# Patient Record
Sex: Male | Born: 1985 | Race: Black or African American | Hispanic: No | Marital: Single | Smoking: Never smoker
Health system: Southern US, Community
[De-identification: ages and names within clinical notes are randomized; demographics above are authoritative.]

## PROBLEM LIST (undated history)

## (undated) DIAGNOSIS — M2550 Pain in unspecified joint: Secondary | ICD-10-CM

## (undated) DIAGNOSIS — M25559 Pain in unspecified hip: Secondary | ICD-10-CM

## (undated) DIAGNOSIS — F988 Other specified behavioral and emotional disorders with onset usually occurring in childhood and adolescence: Secondary | ICD-10-CM

## (undated) DIAGNOSIS — E663 Overweight: Secondary | ICD-10-CM

## (undated) DIAGNOSIS — K635 Polyp of colon: Secondary | ICD-10-CM

## (undated) DIAGNOSIS — I10 Essential (primary) hypertension: Secondary | ICD-10-CM

## (undated) DIAGNOSIS — J45909 Unspecified asthma, uncomplicated: Secondary | ICD-10-CM

## (undated) HISTORY — DX: Other specified behavioral and emotional disorders with onset usually occurring in childhood and adolescence: F98.8

## (undated) HISTORY — PX: TONSILLECTOMY: SUR1361

## (undated) HISTORY — DX: Essential (primary) hypertension: I10

## (undated) HISTORY — DX: Pain in unspecified hip: M25.559

## (undated) HISTORY — DX: Polyp of colon: K63.5

## (undated) HISTORY — DX: Pain in unspecified joint: M25.50

## (undated) HISTORY — PX: WISDOM TOOTH EXTRACTION: SHX21

## (undated) HISTORY — DX: Overweight: E66.3

## (undated) HISTORY — PX: OTHER SURGICAL HISTORY: SHX169

## (undated) HISTORY — DX: Unspecified asthma, uncomplicated: J45.909

---

## 2006-07-18 ENCOUNTER — Emergency Department (HOSPITAL_COMMUNITY): Admission: EM | Admit: 2006-07-18 | Discharge: 2006-07-18 | Payer: Self-pay | Admitting: Family Medicine

## 2006-07-19 ENCOUNTER — Emergency Department (HOSPITAL_COMMUNITY): Admission: EM | Admit: 2006-07-19 | Discharge: 2006-07-19 | Payer: Self-pay | Admitting: Family Medicine

## 2006-07-20 ENCOUNTER — Emergency Department (HOSPITAL_COMMUNITY): Admission: EM | Admit: 2006-07-20 | Discharge: 2006-07-20 | Payer: Self-pay | Admitting: Family Medicine

## 2006-07-22 ENCOUNTER — Inpatient Hospital Stay (HOSPITAL_COMMUNITY): Admission: EM | Admit: 2006-07-22 | Discharge: 2006-07-25 | Payer: Self-pay | Admitting: Family Medicine

## 2007-01-11 ENCOUNTER — Emergency Department (HOSPITAL_COMMUNITY): Admission: EM | Admit: 2007-01-11 | Discharge: 2007-01-12 | Payer: Self-pay | Admitting: Emergency Medicine

## 2007-01-13 ENCOUNTER — Emergency Department (HOSPITAL_COMMUNITY): Admission: EM | Admit: 2007-01-13 | Discharge: 2007-01-13 | Payer: Self-pay | Admitting: Family Medicine

## 2013-07-20 ENCOUNTER — Other Ambulatory Visit: Payer: Self-pay | Admitting: Family Medicine

## 2013-07-20 ENCOUNTER — Ambulatory Visit
Admission: RE | Admit: 2013-07-20 | Discharge: 2013-07-20 | Disposition: A | Discharge status: Home / Self Care | Payer: No Typology Code available for payment source | Source: Ambulatory Visit | Attending: Family Medicine | Admitting: Family Medicine

## 2013-07-20 DIAGNOSIS — N5089 Other specified disorders of the male genital organs: Secondary | ICD-10-CM

## 2016-05-29 ENCOUNTER — Encounter (HOSPITAL_COMMUNITY): Payer: Self-pay | Admitting: *Deleted

## 2016-05-29 ENCOUNTER — Emergency Department (HOSPITAL_COMMUNITY)
Admission: EM | Admit: 2016-05-29 | Discharge: 2016-05-29 | Disposition: A | Discharge status: Home / Self Care | Payer: BLUE CROSS/BLUE SHIELD | Attending: Emergency Medicine | Admitting: Emergency Medicine

## 2016-05-29 DIAGNOSIS — R590 Localized enlarged lymph nodes: Secondary | ICD-10-CM | POA: Diagnosis not present

## 2016-05-29 DIAGNOSIS — Y929 Unspecified place or not applicable: Secondary | ICD-10-CM | POA: Diagnosis not present

## 2016-05-29 DIAGNOSIS — Y999 Unspecified external cause status: Secondary | ICD-10-CM | POA: Diagnosis not present

## 2016-05-29 DIAGNOSIS — W57XXXA Bitten or stung by nonvenomous insect and other nonvenomous arthropods, initial encounter: Secondary | ICD-10-CM | POA: Insufficient documentation

## 2016-05-29 DIAGNOSIS — Y939 Activity, unspecified: Secondary | ICD-10-CM | POA: Diagnosis not present

## 2016-05-29 DIAGNOSIS — S80862A Insect bite (nonvenomous), left lower leg, initial encounter: Secondary | ICD-10-CM | POA: Insufficient documentation

## 2016-05-29 MED ORDER — DOXYCYCLINE HYCLATE 100 MG PO CAPS: 100.0000 mg | ORAL_CAPSULE | Freq: Two times a day (BID) | ORAL | 0 refills | Status: DC

## 2016-05-29 NOTE — ED Provider Notes (Signed)
[place] DEPT Provider Note   CSN: [id number] Arrival date & time: 05/29/16  2050  By signing my name below, I, Jasmyn B. Alexander, attest that this documentation has been prepared under the direction and in the presence of Gloriann Loan, PA-C. Electronically Signed: Tedra Coupe. Sheppard Coil, ED Scribe. 05/29/16. 9:35 PM.  History   Chief Complaint Chief Complaint  Patient presents with  . Insect Bite    HPI HPI Comments: Travis [name] is a 30 y.o. male who presents to the Emergency Department complaining of gradual onset, right-sided lymphadenopathy and throat discomfort x 1 day. Pt reports being bitten by multiple ticks along his entire body after working in the woods on 05/28/16. He notes that ticks were small and were not on his body for more than 36 hours. Pt states that he went to Urgent Care later that day and received a tetanus shot. He has associated rash on bilateral lower extremities, voice change and left ankle pain. No alleviating factors noted. Denies any shortness of breath, trouble swallowing, fever, or chills.   The history is provided by the patient. No language interpreter was used.    History reviewed. No pertinent past medical history.  There are no active problems to display for this patient.   History reviewed. No pertinent surgical history.   Home Medications    Prior to Admission medications   Medication Sig Start Date End Date Taking? Authorizing Provider  doxycycline (VIBRAMYCIN) 100 MG capsule Take 1 capsule (100 mg total) by mouth 2 (two) times daily. 05/29/16   Gloriann Loan, PA-C    Family History No family history on file.  Social History Social History  Substance Use Topics  . Smoking status: Never Smoker  . Smokeless tobacco: Never Used  . Alcohol use Yes    Allergies   Review of patient's allergies indicates no known allergies.   Review of Systems Review of Systems  Constitutional: Negative for chills and fever.  HENT: Positive for  voice change. Negative for trouble swallowing.   Respiratory: Negative for shortness of breath.   Musculoskeletal: Positive for arthralgias.  Skin: Positive for rash.  All other systems reviewed and are negative.   Physical Exam Updated Vital Signs BP (!) 142/110 (BP Location: Left Arm)   Pulse 100   Temp 98.1 F (36.7 C) (Oral)   Resp 19   Ht 6\' 4"  (1.93 m)   Wt 131.5 kg   SpO2 100%   BMI 35.30 kg/m   Physical Exam  Constitutional: He is oriented to person, place, and time. He appears well-developed and well-nourished.  Non-toxic appearance. He does not have a sickly appearance. He does not appear ill.  HENT:  Head: Normocephalic and atraumatic.  Mouth/Throat: Uvula is midline, oropharynx is clear and moist and mucous membranes are normal. No oropharyngeal exudate, posterior oropharyngeal edema or posterior oropharyngeal erythema.  Eyes: Conjunctivae are normal.  Neck: Normal range of motion. Neck supple.  Cardiovascular: Normal rate and regular rhythm.   Pulmonary/Chest: Effort normal and breath sounds normal. No accessory muscle usage or stridor. No respiratory distress. He has no wheezes. He has no rhonchi. He has no rales.  Abdominal: Soft. Bowel sounds are normal. He exhibits no distension. There is no tenderness.  Musculoskeletal: Normal range of motion.  Lymphadenopathy:    He has cervical adenopathy (right sided).  Neurological: He is alert and oriented to person, place, and time.  Speech clear without dysarthria.  Skin: Skin is warm and dry.  Scattered maculopapular rash over  lower left extremity.  No ticks visualized.   Psychiatric: He has a normal mood and affect. His behavior is normal.    ED Treatments / Results  DIAGNOSTIC STUDIES: Oxygen Saturation is 100% on RA, normal by my interpretation.    COORDINATION OF CARE: 9:02 PM-Discussed treatment plan which includes order of Doxycycline with pt at bedside and pt agreed to plan.   Labs (all labs ordered are  listed, but only abnormal results are displayed) [place] MTN SPOTTED FVR ABS PNL(IGG+IGM)   Procedures Procedures (including critical care time)  Medications Ordered in ED Medications - No data to display   Initial Impression / Assessment and Plan / ED Course  I have reviewed the triage vital signs and the nursing notes.  Pertinent labs & imaging results that were available during my care of the patient were reviewed by me and considered in my medical decision making (see chart for details).  Clinical Course   Patient presents with multiple tick bites to LLE and arthralgias.  No ticks seen.  Right cervical lymphadenopathy.  VSS, NAD.  Titers obtained and pending.  Home with Doxycycline.  Follow up PCP.  Return precautions discussed.  Stable for discharge.    Final Clinical Impressions(s) / ED Diagnoses   Final diagnoses:  Tick bites    New Prescriptions New Prescriptions   DOXYCYCLINE (VIBRAMYCIN) 100 MG CAPSULE    Take 1 capsule (100 mg total) by mouth 2 (two) times daily.   I personally performed the services described in this documentation, which was scribed in my presence. The recorded information has been reviewed and is accurate.     Gloriann Loan, PA-C 05/29/16 2231    Drenda Freeze, MD 06/01/16 2136

## 2016-05-29 NOTE — ED Triage Notes (Addendum)
Pt c/o multiple tick bites this week. Reports pulling a tick off R side of neck. Mild swelling noted to area. Seen PCP yesterday who gave a tetanus shot. Denies difficulty breathing, trouble swallowing, fevers, or  Chills.

## 2016-05-31 LAB — B. BURGDORFI ANTIBODIES: B burgdorferi Ab IgG+IgM: <0.91 {ISR} (ref 0.00–0.90)

## 2016-06-03 LAB — ROCKY MTN SPOTTED FVR ABS PNL(IGG+IGM)
RMSF IgG: NEGATIVE
RMSF IgM: 0.38 index (ref 0.00–0.89)

## 2016-06-18 DIAGNOSIS — M79641 Pain in right hand: Secondary | ICD-10-CM | POA: Diagnosis not present

## 2016-07-18 DIAGNOSIS — W57XXXA Bitten or stung by nonvenomous insect and other nonvenomous arthropods, initial encounter: Secondary | ICD-10-CM | POA: Diagnosis not present

## 2016-07-18 DIAGNOSIS — M7989 Other specified soft tissue disorders: Secondary | ICD-10-CM | POA: Diagnosis not present

## 2016-07-18 DIAGNOSIS — M1990 Unspecified osteoarthritis, unspecified site: Secondary | ICD-10-CM | POA: Diagnosis not present

## 2016-08-28 DIAGNOSIS — H1131 Conjunctival hemorrhage, right eye: Secondary | ICD-10-CM | POA: Diagnosis not present

## 2016-11-22 ENCOUNTER — Encounter (HOSPITAL_COMMUNITY): Payer: Self-pay | Admitting: Emergency Medicine

## 2016-11-22 ENCOUNTER — Emergency Department (HOSPITAL_COMMUNITY): Payer: Self-pay

## 2016-11-22 ENCOUNTER — Emergency Department (HOSPITAL_COMMUNITY)
Admission: EM | Admit: 2016-11-22 | Discharge: 2016-11-22 | Disposition: A | Discharge status: Home / Self Care | Payer: Self-pay | Attending: Emergency Medicine | Admitting: Emergency Medicine

## 2016-11-22 DIAGNOSIS — R1033 Periumbilical pain: Secondary | ICD-10-CM | POA: Insufficient documentation

## 2016-11-22 LAB — URINALYSIS, ROUTINE W REFLEX MICROSCOPIC
Bilirubin Urine: NEGATIVE
Glucose, UA: NEGATIVE mg/dL
Hgb urine dipstick: NEGATIVE
Ketones, ur: NEGATIVE mg/dL
Leukocytes, UA: NEGATIVE
Nitrite: NEGATIVE
Protein, ur: 100 mg/dL — AB
Specific Gravity, Urine: 1.027 (ref 1.005–1.030)
Squamous Epithelial / LPF: NONE SEEN
pH: 8 (ref 5.0–8.0)

## 2016-11-22 LAB — CBC
HCT: 46.1 % (ref 39.0–52.0)
Hemoglobin: 16.2 g/dL (ref 13.0–17.0)
MCH: 28.8 pg (ref 26.0–34.0)
MCHC: 35.1 g/dL (ref 30.0–36.0)
MCV: 82 fL (ref 78.0–100.0)
Platelets: 178 10*3/uL (ref 150–400)
RBC: 5.62 MIL/uL (ref 4.22–5.81)
RDW: 12.3 % (ref 11.5–15.5)
WBC: 7.2 10*3/uL (ref 4.0–10.5)

## 2016-11-22 LAB — COMPREHENSIVE METABOLIC PANEL
ALT: 48 U/L (ref 17–63)
AST: 24 U/L (ref 15–41)
Albumin: 4.5 g/dL (ref 3.5–5.0)
Alkaline Phosphatase: 48 U/L (ref 38–126)
Anion gap: 7 (ref 5–15)
BUN: 13 mg/dL (ref 6–20)
CO2: 27 mmol/L (ref 22–32)
Calcium: 9.3 mg/dL (ref 8.9–10.3)
Chloride: 103 mmol/L (ref 101–111)
Creatinine, Ser: 0.73 mg/dL (ref 0.61–1.24)
GFR calc Af Amer: >60 mL/min (ref >60)
GFR calc non Af Amer: >60 mL/min (ref >60)
Glucose, Bld: 114 mg/dL — ABNORMAL HIGH (ref 65–99)
Potassium: 4.1 mmol/L (ref 3.5–5.1)
Sodium: 137 mmol/L (ref 135–145)
Total Bilirubin: 1.2 mg/dL (ref 0.3–1.2)
Total Protein: 7.6 g/dL (ref 6.5–8.1)

## 2016-11-22 LAB — LIPASE, BLOOD: Lipase: 20 U/L (ref 11–51)

## 2016-11-22 MED ORDER — HYDROMORPHONE HCL 1 MG/ML IJ SOLN
1.0000 mg | Freq: Once | INTRAMUSCULAR | Status: AC
  Administered 2016-11-22: 1 mg via INTRAVENOUS
  Filled 2016-11-22: qty 1

## 2016-11-22 MED ORDER — ONDANSETRON HCL 4 MG/2ML IJ SOLN
INTRAMUSCULAR | Status: AC
  Administered 2016-11-22: 4 mg via INTRAVENOUS
  Filled 2016-11-22: qty 2

## 2016-11-22 MED ORDER — IOPAMIDOL (ISOVUE-300) INJECTION 61%
INTRAVENOUS | Status: AC
  Filled 2016-11-22: qty 100

## 2016-11-22 MED ORDER — ONDANSETRON HCL 4 MG PO TABS: 4.0000 mg | ORAL_TABLET | Freq: Four times a day (QID) | ORAL | 0 refills | Status: DC

## 2016-11-22 MED ORDER — OMEPRAZOLE 20 MG PO CPDR: 20.0000 mg | DELAYED_RELEASE_CAPSULE | Freq: Every day | ORAL | 0 refills | Status: DC

## 2016-11-22 MED ORDER — IOPAMIDOL (ISOVUE-300) INJECTION 61%
100.0000 mL | Freq: Once | INTRAVENOUS | Status: AC | PRN
  Administered 2016-11-22: 100 mL via INTRAVENOUS

## 2016-11-22 MED ORDER — ONDANSETRON HCL 4 MG/2ML IJ SOLN
4.0000 mg | Freq: Once | INTRAMUSCULAR | Status: AC | PRN
  Administered 2016-11-22: 4 mg via INTRAVENOUS

## 2016-11-22 MED ORDER — SODIUM CHLORIDE 0.9 % IJ SOLN
INTRAMUSCULAR | Status: AC
  Filled 2016-11-22: qty 50

## 2016-11-22 NOTE — ED Provider Notes (Signed)
[facility] DEPT Provider Note   CSN: [id number] Arrival date & time: 11/22/16  0349     History   Chief Complaint Chief Complaint  Patient presents with  . Abdominal Pain    HPI Travis [name] is a 31 y.o. male.  Patient presents to the ED with a chief complaint of abdominal pain.  He states that the pain started last night and has steadily worsened until now.  He states that the pain is located mostly around his umbilicus.  He reports subjective fevers and chills with associated nausea, but denies any vomiting or diarrhea.  He states that he has tried taking pepto-bismol and imodium with no relief.  He rates the pain as a 7/10.  He denies any prior abdominal surgeries.   The history is provided by the patient. No language interpreter was used.    History reviewed. No pertinent past medical history.  There are no active problems to display for this patient.   Past Surgical History:  Procedure Laterality Date  . TONSILLECTOMY    . WISDOM TOOTH EXTRACTION         Home Medications    Prior to Admission medications   Medication Sig Start Date End Date Taking? Authorizing Provider  meloxicam (MOBIC) 15 MG tablet Take 15 mg by mouth daily as needed for pain.   Yes Historical Provider, MD  phentermine (ADIPEX-P) 37.5 MG tablet Take 37.5 mg by mouth daily before breakfast.   Yes Historical Provider, MD  doxycycline (VIBRAMYCIN) 100 MG capsule Take 1 capsule (100 mg total) by mouth 2 (two) times daily. Patient not taking: Reported on 11/22/2016 05/29/16   Gloriann Loan, PA-C    Family History No family history on file.  Social History Social History  Substance Use Topics  . Smoking status: Never Smoker  . Smokeless tobacco: Never Used  . Alcohol use Yes     Comment: on weekends     Allergies   Patient has no known allergies.   Review of Systems Review of Systems  Gastrointestinal: Positive for abdominal pain and nausea. Negative for diarrhea and vomiting.    All other systems reviewed and are negative.    Physical Exam Updated Vital Signs BP 155/82 (BP Location: Left Arm)   Pulse 96   Temp 98.2 F (36.8 C) (Oral)   Resp 18   SpO2 99%   Physical Exam  Constitutional: He is oriented to person, place, and time. He appears well-developed and well-nourished.  HENT:  Head: Normocephalic and atraumatic.  Eyes: Conjunctivae and EOM are normal. Pupils are equal, round, and reactive to light. Right eye exhibits no discharge. Left eye exhibits no discharge. No scleral icterus.  Neck: Normal range of motion. Neck supple. No JVD present.  Cardiovascular: Normal rate, regular rhythm and normal heart sounds.  Exam reveals no gallop and no friction rub.   No murmur heard. Pulmonary/Chest: Effort normal and breath sounds normal. No respiratory distress. He has no wheezes. He has no rales. He exhibits no tenderness.  Abdominal: Soft. Bowel sounds are normal. He exhibits no distension and no mass. There is tenderness. There is no rebound and no guarding.  Periumbilical tenderness, some right and left lower quadrant tenderness, no RUQ tenderness  Musculoskeletal: Normal range of motion. He exhibits no edema or tenderness.  Neurological: He is alert and oriented to person, place, and time.  Skin: Skin is warm and dry.  Psychiatric: He has a normal mood and affect. His behavior is normal. Judgment and thought  content normal.  Nursing note and vitals reviewed.    ED Treatments / Results  Labs (all labs ordered are listed, but only abnormal results are displayed) Labs Reviewed  COMPREHENSIVE METABOLIC PANEL - Abnormal; Notable for the following:       Result Value   Glucose, Bld 114 (*)    All other components within normal limits  URINALYSIS, ROUTINE W REFLEX MICROSCOPIC - Abnormal; Notable for the following:    APPearance HAZY (*)    Protein, ur 100 (*)    Bacteria, UA RARE (*)    All other components within normal limits  LIPASE, BLOOD  CBC     EKG  EKG Interpretation None       Radiology No results found.  Procedures Procedures (including critical care time)  Medications Ordered in ED Medications  HYDROmorphone (DILAUDID) injection 1 mg (not administered)  ondansetron (ZOFRAN) injection 4 mg (4 mg Intravenous Given 11/22/16 0415)     Initial Impression / Assessment and Plan / ED Course  I have reviewed the triage vital signs and the nursing notes.  Pertinent labs & imaging results that were available during my care of the patient were reviewed by me and considered in my medical decision making (see chart for details).     Patient with periumbilical pain.  Onset yesterday, worsening throughout the night.    Will check CT given location.  Labs are reassuring.  CT is negative.  Patient feeling better and wants to eat.  Patient instructed to return for:  New or worsening symptoms, including, increased abdominal pain, especially pain that localizes to one side, bloody vomit, bloody diarrhea, fever >101, and intractable vomiting.   Final Clinical Impressions(s) / ED Diagnoses   Final diagnoses:  Periumbilical abdominal pain    New Prescriptions New Prescriptions   OMEPRAZOLE (PRILOSEC) 20 MG CAPSULE    Take 1 capsule (20 mg total) by mouth daily.   ONDANSETRON (ZOFRAN) 4 MG TABLET    Take 1 tablet (4 mg total) by mouth every 6 (six) hours.     Montine Circle, PA-C 11/22/16 [id number]    April Palumbo, MD 11/23/16 [phone]

## 2016-11-22 NOTE — ED Triage Notes (Signed)
Pt c/o abdominal pain that began yesterday; pt took Imodium but pain did not subside as pt had hoped; pain is primarily located on left side; denies vomiting and diarrhea; denies urinary symptoms; last BM today

## 2016-11-22 NOTE — ED Notes (Signed)
Pt suddenly became diaphoretic with c/o extreme nausea and chills; pt in obvious distress; pt assisted to restroom with his wife

## 2016-11-22 NOTE — ED Notes (Signed)
Asked pt if he could give a urine sample, but said he is not able to.

## 2018-02-16 DIAGNOSIS — H5213 Myopia, bilateral: Secondary | ICD-10-CM | POA: Diagnosis not present

## 2018-04-23 DIAGNOSIS — L920 Granuloma annulare: Secondary | ICD-10-CM | POA: Diagnosis not present

## 2018-05-13 DIAGNOSIS — L531 Erythema annulare centrifugum: Secondary | ICD-10-CM | POA: Diagnosis not present

## 2018-05-13 DIAGNOSIS — B078 Other viral warts: Secondary | ICD-10-CM | POA: Diagnosis not present

## 2018-07-14 DIAGNOSIS — Z01818 Encounter for other preprocedural examination: Secondary | ICD-10-CM | POA: Diagnosis not present

## 2018-08-24 DIAGNOSIS — H40013 Open angle with borderline findings, low risk, bilateral: Secondary | ICD-10-CM | POA: Diagnosis not present

## 2018-08-28 DIAGNOSIS — Z86010 Personal history of colonic polyps: Secondary | ICD-10-CM | POA: Diagnosis not present

## 2019-03-30 DIAGNOSIS — Z20828 Contact with and (suspected) exposure to other viral communicable diseases: Secondary | ICD-10-CM | POA: Diagnosis not present

## 2019-04-05 DIAGNOSIS — Z03818 Encounter for observation for suspected exposure to other biological agents ruled out: Secondary | ICD-10-CM | POA: Diagnosis not present

## 2019-05-10 ENCOUNTER — Other Ambulatory Visit: Payer: Self-pay

## 2019-05-10 DIAGNOSIS — Z20822 Contact with and (suspected) exposure to covid-19: Secondary | ICD-10-CM

## 2019-05-12 LAB — NOVEL CORONAVIRUS, NAA: SARS-CoV-2, NAA: DETECTED — AB

## 2019-07-06 DIAGNOSIS — H40013 Open angle with borderline findings, low risk, bilateral: Secondary | ICD-10-CM | POA: Diagnosis not present

## 2019-07-21 ENCOUNTER — Other Ambulatory Visit: Payer: Self-pay

## 2019-07-23 ENCOUNTER — Ambulatory Visit: Payer: BLUE CROSS/BLUE SHIELD | Admitting: Family Medicine

## 2019-07-28 ENCOUNTER — Other Ambulatory Visit: Payer: Self-pay

## 2019-07-28 ENCOUNTER — Ambulatory Visit (INDEPENDENT_AMBULATORY_CARE_PROVIDER_SITE_OTHER): Payer: BC Managed Care – PPO | Admitting: Family Medicine

## 2019-07-28 ENCOUNTER — Encounter: Payer: Self-pay | Admitting: Family Medicine

## 2019-07-28 VITALS — BP 122/82 | HR 81 | Temp 97.8°F | Resp 12 | Ht 76.0 in | Wt 392.6 lb

## 2019-07-28 DIAGNOSIS — Z114 Encounter for screening for human immunodeficiency virus [HIV]: Secondary | ICD-10-CM | POA: Diagnosis not present

## 2019-07-28 DIAGNOSIS — Z6841 Body Mass Index (BMI) 40.0 and over, adult: Secondary | ICD-10-CM | POA: Insufficient documentation

## 2019-07-28 DIAGNOSIS — E66813 Obesity, class 3: Secondary | ICD-10-CM

## 2019-07-28 DIAGNOSIS — M25552 Pain in left hip: Secondary | ICD-10-CM

## 2019-07-28 DIAGNOSIS — Z0000 Encounter for general adult medical examination without abnormal findings: Secondary | ICD-10-CM | POA: Diagnosis not present

## 2019-07-28 HISTORY — DX: Body Mass Index (BMI) 40.0 and over, adult: Z684

## 2019-07-28 HISTORY — DX: Obesity, class 3: E66.813

## 2019-07-28 HISTORY — DX: Morbid (severe) obesity due to excess calories: E66.01

## 2019-07-28 HISTORY — DX: Pain in left hip: M25.552

## 2019-07-28 LAB — CBC
HCT: 44.6 % (ref 39.0–52.0)
Hemoglobin: 14.8 g/dL (ref 13.0–17.0)
MCHC: 33.1 g/dL (ref 30.0–36.0)
MCV: 85.2 fl (ref 78.0–100.0)
Platelets: 214 10*3/uL (ref 150.0–400.0)
RBC: 5.24 Mil/uL (ref 4.22–5.81)
RDW: 13.7 % (ref 11.5–15.5)
WBC: 5 10*3/uL (ref 4.0–10.5)

## 2019-07-28 LAB — COMPREHENSIVE METABOLIC PANEL
ALT: 41 U/L (ref 0–53)
AST: 20 U/L (ref 0–37)
Albumin: 4.8 g/dL (ref 3.5–5.2)
Alkaline Phosphatase: 47 U/L (ref 39–117)
BUN: 12 mg/dL (ref 6–23)
CO2: 27 mEq/L (ref 19–32)
Calcium: 9.7 mg/dL (ref 8.4–10.5)
Chloride: 104 mEq/L (ref 96–112)
Creatinine, Ser: 0.83 mg/dL (ref 0.40–1.50)
GFR: 128.89 mL/min (ref >60.00)
Glucose, Bld: 101 mg/dL — ABNORMAL HIGH (ref 70–99)
Potassium: 4.5 mEq/L (ref 3.5–5.1)
Sodium: 140 mEq/L (ref 135–145)
Total Bilirubin: 0.6 mg/dL (ref 0.2–1.2)
Total Protein: 7.4 g/dL (ref 6.0–8.3)

## 2019-07-28 LAB — LIPID PANEL
Cholesterol: 201 mg/dL — ABNORMAL HIGH (ref 0–200)
HDL: 48.4 mg/dL (ref >39.00)
LDL Cholesterol: 141 mg/dL — ABNORMAL HIGH (ref 0–99)
NonHDL: 152.55
Total CHOL/HDL Ratio: 4
Triglycerides: 59 mg/dL (ref 0.0–149.0)
VLDL: 11.8 mg/dL (ref 0.0–40.0)

## 2019-07-28 NOTE — Progress Notes (Signed)
Chief Complaint  Patient presents with  . Establish Care  . Hip Pain    Well Male Travis [name] is here for a complete physical.   His last physical was >1 year ago.  Current diet: in general, diet is fair.   Current exercise: HIIT Weight trend: has been losing Daytime fatigue? No. Seat belt? Yes.    Health maintenance Tetanus- Yes HIV- No   Patient has a several month history of left outer hip pain.  No injury or change in activity but when he does high intensity interval training, he notices it most after that.  He tries to stretch and stay active.  No bruising or swelling.  The pain/discomfort radiates down his leg to about the knee area.  No neurologic signs or symptoms.  Has been dealing with weight, his highest weight this yr was around 420 pounds.  He started to clean up his diet more so.  He is interested in ways he can lose weight safely.  Past Medical History:  Diagnosis Date  . Asthma    childhood  . Colon polyps   . HTN (hypertension)      Past Surgical History:  Procedure Laterality Date  . arm Right   . TONSILLECTOMY    . WISDOM TOOTH EXTRACTION      Medications  Takes no meds routinely   Allergies No Known Allergies  Family History Family History  Problem Relation Age of Onset  . Obesity Mother   . Mental illness Mother   . Irritable bowel syndrome Sister   . Hearing loss Maternal Grandmother     Review of Systems: Constitutional: no fevers or chills Eye:  no recent significant change in vision Ear/Nose/Mouth/Throat:  Ears:  no hearing loss Nose/Mouth/Throat:  no complaints of nasal congestion, no sore throat Cardiovascular:  no chest pain Respiratory:  no shortness of breath Gastrointestinal:  no abdominal pain, no change in bowel habits GU:  Male: negative for dysuria, frequency, and incontinence Musculoskeletal/Extremities: +hip pain; otherwise no pain of the joints Integumentary (Skin/Breast):  no abnormal skin lesions  reported Neurologic:  no headaches Endocrine: No unexpected weight changes Hematologic/Lymphatic:  no night sweats  Exam BP 122/82 (BP Location: Right Arm, Cuff Size: Large)   Pulse 81   Temp 97.8 F (36.6 C) (Temporal)   Resp 12   Ht 6\' 4"  (1.93 m)   Wt (!) 392 lb 9.6 oz (178.1 kg)   SpO2 98%   BMI 47.79 kg/m  General:  well developed, well nourished, in no apparent distress Skin:  no significant moles, warts, or growths Head:  no masses, lesions, or tenderness Eyes:  pupils equal and round, sclera anicteric without injection Ears:  canals without lesions, TMs shiny without retraction, no obvious effusion, no erythema Nose:  nares patent, septum midline, mucosa normal Throat/Pharynx:  lips and gingiva without lesion; tongue and uvula midline; non-inflamed pharynx; no exudates or postnasal drainage Neck: neck supple without adenopathy, thyromegaly, or masses Lungs:  clear to auscultation, breath sounds equal bilaterally, no respiratory distress Cardio:  regular rate and rhythm, no bruits, no LE edema Abdomen:  abdomen soft, nontender; bowel sounds normal; no masses or organomegaly Genital (male): nml penis, no lesions or discharge; testes present bilaterally without masses or tenderness Rectal: Deferred Musculoskeletal: Tight glue muscles; there is no current tenderness to palpation over the greater trochanteric bursa, no deformity; symmetrical muscle groups noted without atrophy or deformity Extremities:  no clubbing, cyanosis, or edema, no deformities, no skin discoloration Neuro:  gait normal; deep tendon reflexes normal and symmetric Psych: well oriented with normal range of affect and appropriate judgment/insight  Assessment and Plan  Well adult exam - Plan: CBC, Comprehensive metabolic panel, Lipid panel  Screening for HIV (human immunodeficiency virus) - Plan: HIV Antibody (routine testing w rflx)  Morbid obesity ([name]) - Plan: Amb Ref to Medical Weight  Management  Left hip pain   Well 33 y.o. male. Counseled on diet and exercise. Self testicular exams recommended at least monthly.  Contact insurance to see if they cover a dietitian/nutritionist.  If so, I can put the referral in.  Healthy diet handout given. Stretches and exercises for the gluteus group and IT band provided.  If no improvement, consider injection versus physical therapy. Other orders as above.  He is to find out when his last tetanus shot was. Follow up in 1 year pending the above workup. The patient voiced understanding and agreement to the plan.  [place], DO 07/28/19 10:57 AM

## 2019-07-28 NOTE — Patient Instructions (Addendum)
Give Travis [name] 2-3 business days to get the results of your labs back.   Let me know when your last tetanus shot was.  Keep the diet clean and stay active.  Do monthly self testicular checks in the shower. You are feeling for lumps/bumps that don't belong. If you feel anything like this, let me know!  If you do not hear anything about your referral in the next 1-2 weeks, call our office and ask for an update.  Let Travis [name] know if you need anything.  Healthy Eating Plan Many factors influence your heart health, including eating and exercise habits. Heart (coronary) risk increases with abnormal blood fat (lipid) levels. Heart-healthy meal planning includes limiting unhealthy fats, increasing healthy fats, and making other small dietary changes. This includes maintaining a healthy body weight to help keep lipid levels within a normal range.  WHAT IS MY PLAN?  Your health care provider recommends that you:  Drink a glass of water before meals to help with satiety.  Eat slowly.  An alternative to the water is to add Metamucil. This will help with satiety as well. It does contain calories, unlike water.  WHAT TYPES OF FAT SHOULD I CHOOSE?  Choose healthy fats more often. Choose monounsaturated and polyunsaturated fats, such as olive oil and canola oil, flaxseeds, walnuts, almonds, and seeds.  Eat more omega-3 fats. Good choices include salmon, mackerel, sardines, tuna, flaxseed oil, and ground flaxseeds. Aim to eat fish at least two times each week.  Avoid foods with partially hydrogenated oils in them. These contain trans fats. Examples of foods that contain trans fats are stick margarine, some tub margarines, cookies, crackers, and other baked goods. If you are going to avoid a fat, this is the one to avoid!  WHAT GENERAL GUIDELINES DO I NEED TO FOLLOW?  Check food labels carefully to identify foods with trans fats. Avoid these types of options when possible.  Fill one half of your plate with  vegetables and green salads. Eat 4-5 servings of vegetables per day. A serving of vegetables equals 1 cup of raw leafy vegetables,  cup of raw or cooked cut-up vegetables, or  cup of vegetable juice.  Fill one fourth of your plate with whole grains. Look for the word "whole" as the first word in the ingredient list.  Fill one fourth of your plate with lean protein foods.  Eat 4-5 servings of fruit per day. A serving of fruit equals one medium whole fruit,  cup of dried fruit,  cup of fresh, frozen, or canned fruit. Try to avoid fruits in cups/syrups as the sugar content can be high.  Eat more foods that contain soluble fiber. Examples of foods that contain this type of fiber are apples, broccoli, carrots, beans, peas, and barley. Aim to get 20-30 g of fiber per day.  Eat more home-cooked food and less restaurant, buffet, and fast food.  Limit or avoid alcohol.  Limit foods that are high in starch and sugar.  Avoid fried foods when able.  Cook foods by using methods other than frying. Baking, boiling, grilling, and broiling are all great options. Other fat-reducing suggestions include: ? Removing the skin from poultry. ? Removing all visible fats from meats. ? Skimming the fat off of stews, soups, and gravies before serving them. ? Steaming vegetables in water or broth.  Lose weight if you are overweight. Losing just 5-10% of your initial body weight can help your overall health and prevent diseases such as diabetes and heart  disease.  Increase your consumption of nuts, legumes, and seeds to 4-5 servings per week. One serving of dried beans or legumes equals  cup after being cooked, one serving of nuts equals 1 ounces, and one serving of seeds equals  ounce or 1 tablespoon.  WHAT ARE GOOD FOODS CAN I EAT? Grains Grainy breads (try to find bread that is 3 g of fiber per slice or greater), oatmeal, light popcorn. Whole-grain cereals. Rice and pasta, including brown rice and those  that are made with whole wheat. Edamame pasta is a great alternative to grain pasta. It has a higher protein content. Try to avoid significant consumption of white bread, sugary cereals, or pastries/baked goods.  Vegetables All vegetables. Cooked white potatoes do not count as vegetables.  Fruits All fruits, but limit pineapple and bananas as these fruits have a higher sugar content.  Meats and Other Protein Sources Lean, well-trimmed beef, veal, pork, and lamb. Chicken and [place] without skin. All fish and shellfish. Wild duck, rabbit, pheasant, and venison. Egg whites or low-cholesterol egg substitutes. Dried beans, peas, lentils, and tofu.Seeds and most nuts.  Dairy Low-fat or nonfat cheeses, including ricotta, string, and mozzarella. Skim or 1% milk that is liquid, powdered, or evaporated. Buttermilk that is made with low-fat milk. Nonfat or low-fat yogurt. Soy/Almond milk are good alternatives if you cannot handle dairy.  Beverages Water is the best for you. Sports drinks with less sugar are more desirable unless you are a highly active athlete.  Sweets and Desserts Sherbets and fruit ices. Honey, jam, marmalade, jelly, and syrups. Dark chocolate.  Eat all sweets and desserts in moderation.  Fats and Oils Nonhydrogenated (trans-free) margarines. Vegetable oils, including soybean, sesame, sunflower, olive, peanut, safflower, corn, canola, and cottonseed. Salad dressings or mayonnaise that are made with a vegetable oil. Limit added fats and oils that you use for cooking, baking, salads, and as spreads.  Other Cocoa powder. Coffee and tea. Most condiments.  The items listed above may not be a complete list of recommended foods or beverages. Contact your dietitian for more options.  Iliotibial Band Syndrome Rehab It is normal to feel mild stretching, pulling, tightness, or discomfort as you do these exercises, but you should stop right away if you feel sudden pain or your pain gets  worse.  Stretching and range of motion exercises These exercises warm up your muscles and joints and improve the movement and flexibility of your hip and pelvis. Exercise A: Quadriceps, prone    1. Lie on your abdomen on a firm surface, such as a bed or padded floor. 2. Bend your left / right knee and hold your ankle. If you cannot reach your ankle or pant leg, loop a belt around your foot and grab the belt instead. 3. Gently pull your heel toward your buttocks. Your knee should not slide out to the side. You should feel a stretch in the front of your thigh and knee. 4. Hold this position for 30 seconds. Repeat 2 times. Complete this stretch 3 times per week. Exercise B: Iliotibial band    1. Lie on your side with your left / right leg in the top position. 2. Bend both of your knees and grab your left / right ankle. Stretch out your bottom arm to help you balance. 3. Slowly bring your top knee back so your thigh goes behind your trunk. 4. Slowly lower your top leg toward the floor until you feel a gentle stretch on the outside of your  left / right hip and thigh. If you do not feel a stretch and your knee will not fall farther, place the heel of your other foot on top of your knee and pull your knee down toward the floor with your foot. 5. Hold this position for 30 seconds. Repeat 2 times. Complete this stretch 3 times per week. Strengthening exercises These exercises build strength and endurance in your hip and pelvis. Endurance is the ability to use your muscles for a long time, even after they get tired. Exercise C: Straight leg raises (hip abductors)     1. Lie on your side with your left / right leg in the top position. Lie so your head, shoulder, knee, and hip line up. You may bend your bottom knee to help you balance. 2. Roll your hips slightly forward so your hips are stacked directly over each other and your left / right knee is facing forward. 3. Tense the muscles in your outer  thigh and lift your top leg 4-6 inches (10-15 cm). 4. Hold this position for 3 seconds. Repeat for a total of 10 reps. 5. Slowly return to the starting position. Let your muscles relax completely before doing another repetition. Repeat 2 times. Complete this exercise 3 times per week. Exercise D: Straight leg raises (hip extensors) 1. Lie on your abdomen on your bed or a firm surface. You can put a pillow under your hips if that is more comfortable. 2. Bend your left / right knee so your foot is straight up in the air. 3. Squeeze your buttock muscles and lift your left / right thigh off the bed. Do not let your back arch. 4. Tense this muscle as hard as you can without increasing any knee pain. 5. Hold this position for 2 seconds. Repeat for a total of 10 reps 6. Slowly lower your leg to the starting position and allow it to relax completely. Repeat 2 times. Complete this exercise 3 times per week. Exercise E: Hip hike 1. Stand sideways on a bottom step. Stand on your left / right leg with your other foot unsupported next to the step. You can hold onto the railing or wall if needed for balance. 2. Keep your knees straight and your torso square. Then, lift your left / right hip up toward the ceiling. 3. Slowly let your left / right hip lower toward the floor, past the starting position. Your foot should get closer to the floor. Do not lean or bend your knees. Repeat 2 times. Complete this exercise 3 times per week.  Document Released: 09/23/2005 Document Revised: 05/28/2016 Document Reviewed: 08/25/2015 Elsevier Interactive Patient Education  2018 [place] Band Syndrome Rehab It is normal to feel mild stretching, pulling, tightness, or discomfort as you do these exercises, but you should stop right away if you feel sudden pain or your pain gets worse.  Stretching and range of motion exercises These exercises warm up your muscles and joints and improve the movement and  flexibility of your hip and pelvis. Exercise A: Quadriceps, prone    5. Lie on your abdomen on a firm surface, such as a bed or padded floor. 6. Bend your left / right knee and hold your ankle. If you cannot reach your ankle or pant leg, loop a belt around your foot and grab the belt instead. 7. Gently pull your heel toward your buttocks. Your knee should not slide out to the side. You should feel a stretch in  the front of your thigh and knee. 8. Hold this position for 30 seconds. Repeat 2 times. Complete this stretch 3 times per week. Exercise B: Iliotibial band    6. Lie on your side with your left / right leg in the top position. 7. Bend both of your knees and grab your left / right ankle. Stretch out your bottom arm to help you balance. 8. Slowly bring your top knee back so your thigh goes behind your trunk. 9. Slowly lower your top leg toward the floor until you feel a gentle stretch on the outside of your left / right hip and thigh. If you do not feel a stretch and your knee will not fall farther, place the heel of your other foot on top of your knee and pull your knee down toward the floor with your foot. 10. Hold this position for 30 seconds. Repeat 2 times. Complete this stretch 3 times per week. Strengthening exercises These exercises build strength and endurance in your hip and pelvis. Endurance is the ability to use your muscles for a long time, even after they get tired. Exercise C: Straight leg raises (hip abductors)     6. Lie on your side with your left / right leg in the top position. Lie so your head, shoulder, knee, and hip line up. You may bend your bottom knee to help you balance. 7. Roll your hips slightly forward so your hips are stacked directly over each other and your left / right knee is facing forward. 8. Tense the muscles in your outer thigh and lift your top leg 4-6 inches (10-15 cm). 9. Hold this position for 3 seconds. Repeat for a total of 10  reps. 10. Slowly return to the starting position. Let your muscles relax completely before doing another repetition. Repeat 2 times. Complete this exercise 3 times per week. Exercise D: Straight leg raises (hip extensors) 7. Lie on your abdomen on your bed or a firm surface. You can put a pillow under your hips if that is more comfortable. 8. Bend your left / right knee so your foot is straight up in the air. 9. Squeeze your buttock muscles and lift your left / right thigh off the bed. Do not let your back arch. 10. Tense this muscle as hard as you can without increasing any knee pain. 11. Hold this position for 2 seconds. Repeat for a total of 10 reps 12. Slowly lower your leg to the starting position and allow it to relax completely. Repeat 2 times. Complete this exercise 3 times per week. Exercise E: Hip hike 4. Stand sideways on a bottom step. Stand on your left / right leg with your other foot unsupported next to the step. You can hold onto the railing or wall if needed for balance. 5. Keep your knees straight and your torso square. Then, lift your left / right hip up toward the ceiling. 6. Slowly let your left / right hip lower toward the floor, past the starting position. Your foot should get closer to the floor. Do not lean or bend your knees. Repeat 2 times. Complete this exercise 3 times per week.  Document Released: 09/23/2005 Document Revised: 05/28/2016 Document Reviewed: 08/25/2015 Elsevier Interactive Patient Education  2018 [employer].    Piriformis Syndrome Rehab It is normal to feel mild stretching, pulling, tightness, or discomfort as you do these exercises, but you should stop right away if you feel sudden pain or your pain gets worse.  Stretching  and range of motion exercises These exercises warm up your muscles and joints and improve the movement and flexibility of your hip and pelvis. These exercises also help to relieve pain, numbness, and tingling. Exercise A:  Hip rotators   1. Lie on your back on a firm surface. 2. Pull your left / right knee toward your same shoulder with your left / right hand until your knee is pointing toward the ceiling. Hold your left / right ankle with your other hand. 3. Keeping your knee steady, gently pull your left / right ankle toward your other shoulder until you feel a stretch in your buttocks. 4. Hold this position for 30 seconds. Repeat 2 times. Complete this stretch 3 times per week. Exercise B: Hip extensors 1. Lie on your back on a firm surface. Both of your legs should be straight. 2. Pull your left / right knee to your chest. Hold your leg in this position by holding onto the back of your thigh or the front of your knee. 3. Hold this position for 30 seconds. 4. Slowly return to the starting position. Repeat 2 times. Complete this stretch 3 times per week.  Strengthening exercises These exercises build strength and endurance in your hip and thigh muscles. Endurance is the ability to use your muscles for a long time, even after they get tired. Exercise C: Straight leg raises (hip abductors)    1. Lie on your side with your left / right leg in the top position. Lie so your head, shoulder, knee, and hip line up. Bend your bottom knee to help you balance. 2. Lift your top leg up 4-6 inches (10-15 cm), keeping your toes pointed straight ahead. 3. Hold this position for 1 second. 4. Slowly lower your leg to the starting position. Let your muscles relax completely. Repeat for a total of 10 repetitions. Repeat 2 times. Complete this exercise 3 times per week. Exercise D: Hip abductors and rotators, quadruped   1. Get on your hands and knees on a firm, lightly padded surface. Your hands should be directly below your shoulders, and your knees should be directly below your hips. 2. Lift your left / right knee out to the side. Keep your knee bent. Do not twist your body. 3. Hold this position for 1  seconds. 4. Slowly lower your leg. Repeat for a total of 10 repetitions.  Repeat 1 times. Complete this exercise 3 times per week. Exercise E: Straight leg raises (hip extensors) 1. Lie on your abdomen on a bed or a firm surface with a pillow under your hips. 2. Squeeze your buttock muscles and lift your left / right thigh off the bed. Do not let your back arch. 3. Hold this position for 3 seconds. 4. Slowly return to the starting position. Let your muscles relax completely before doing another repetition. Repeat 2 times. Complete this exercise 3 times per week.  This information is not intended to replace advice given to you by your health care provider. Make sure you discuss any questions you have with your health care provider. Document Released: 09/23/2005 Document Revised: 05/28/2016 Document Reviewed: 09/05/2015 Elsevier Interactive Patient Education  [employer].

## 2019-07-29 ENCOUNTER — Other Ambulatory Visit (INDEPENDENT_AMBULATORY_CARE_PROVIDER_SITE_OTHER): Payer: BC Managed Care – PPO

## 2019-07-29 DIAGNOSIS — R739 Hyperglycemia, unspecified: Secondary | ICD-10-CM

## 2019-07-29 LAB — HEMOGLOBIN A1C: Hgb A1c MFr Bld: 5.6 % (ref 4.6–6.5)

## 2019-07-29 LAB — HIV ANTIBODY (ROUTINE TESTING W REFLEX): HIV 1&2 Ab, 4th Generation: NONREACTIVE

## 2019-08-10 ENCOUNTER — Ambulatory Visit: Payer: BLUE CROSS/BLUE SHIELD | Admitting: Family Medicine

## 2019-08-23 ENCOUNTER — Other Ambulatory Visit: Payer: Self-pay | Admitting: Family Medicine

## 2019-08-23 MED ORDER — ONDANSETRON 4 MG PO TBDP: 4.0000 mg | ORAL_TABLET | Freq: Three times a day (TID) | ORAL | 0 refills | Status: DC | PRN

## 2019-09-29 DIAGNOSIS — M4722 Other spondylosis with radiculopathy, cervical region: Secondary | ICD-10-CM | POA: Diagnosis not present

## 2019-09-29 DIAGNOSIS — M9901 Segmental and somatic dysfunction of cervical region: Secondary | ICD-10-CM | POA: Diagnosis not present

## 2019-09-29 DIAGNOSIS — M9902 Segmental and somatic dysfunction of thoracic region: Secondary | ICD-10-CM | POA: Diagnosis not present

## 2019-09-29 DIAGNOSIS — M9903 Segmental and somatic dysfunction of lumbar region: Secondary | ICD-10-CM | POA: Diagnosis not present

## 2019-10-09 ENCOUNTER — Encounter (HOSPITAL_COMMUNITY): Payer: Self-pay

## 2019-10-09 ENCOUNTER — Emergency Department (HOSPITAL_COMMUNITY): Payer: BC Managed Care – PPO

## 2019-10-09 ENCOUNTER — Emergency Department (HOSPITAL_COMMUNITY)
Admission: EM | Admit: 2019-10-09 | Discharge: 2019-10-09 | Disposition: A | Discharge status: Home / Self Care | Payer: BC Managed Care – PPO | Attending: Emergency Medicine | Admitting: Emergency Medicine

## 2019-10-09 ENCOUNTER — Other Ambulatory Visit: Payer: Self-pay

## 2019-10-09 DIAGNOSIS — J45909 Unspecified asthma, uncomplicated: Secondary | ICD-10-CM | POA: Diagnosis not present

## 2019-10-09 DIAGNOSIS — R202 Paresthesia of skin: Secondary | ICD-10-CM | POA: Insufficient documentation

## 2019-10-09 DIAGNOSIS — R071 Chest pain on breathing: Secondary | ICD-10-CM | POA: Diagnosis not present

## 2019-10-09 DIAGNOSIS — I10 Essential (primary) hypertension: Secondary | ICD-10-CM | POA: Insufficient documentation

## 2019-10-09 DIAGNOSIS — R079 Chest pain, unspecified: Secondary | ICD-10-CM | POA: Diagnosis not present

## 2019-10-09 DIAGNOSIS — R0789 Other chest pain: Secondary | ICD-10-CM | POA: Insufficient documentation

## 2019-10-09 DIAGNOSIS — R0602 Shortness of breath: Secondary | ICD-10-CM | POA: Diagnosis not present

## 2019-10-09 LAB — CBC WITH DIFFERENTIAL/PLATELET
Abs Immature Granulocytes: 0.02 10*3/uL (ref 0.00–0.07)
Basophils Absolute: 0 10*3/uL (ref 0.0–0.1)
Basophils Relative: 0 %
Eosinophils Absolute: 0.2 10*3/uL (ref 0.0–0.5)
Eosinophils Relative: 3 %
HCT: 48 % (ref 39.0–52.0)
Hemoglobin: 15.5 g/dL (ref 13.0–17.0)
Immature Granulocytes: 0 %
Lymphocytes Relative: 18 %
Lymphs Abs: 1 10*3/uL (ref 0.7–4.0)
MCH: 27.5 pg (ref 26.0–34.0)
MCHC: 32.3 g/dL (ref 30.0–36.0)
MCV: 85.3 fL (ref 80.0–100.0)
Monocytes Absolute: 0.4 10*3/uL (ref 0.1–1.0)
Monocytes Relative: 7 %
Neutro Abs: 4 10*3/uL (ref 1.7–7.7)
Neutrophils Relative %: 72 %
Platelets: 218 10*3/uL (ref 150–400)
RBC: 5.63 MIL/uL (ref 4.22–5.81)
RDW: 12.5 % (ref 11.5–15.5)
WBC: 5.6 10*3/uL (ref 4.0–10.5)
nRBC: 0 % (ref 0.0–0.2)

## 2019-10-09 LAB — COMPREHENSIVE METABOLIC PANEL
ALT: 39 U/L (ref 0–44)
AST: 21 U/L (ref 15–41)
Albumin: 4.3 g/dL (ref 3.5–5.0)
Alkaline Phosphatase: 43 U/L (ref 38–126)
Anion gap: 10 (ref 5–15)
BUN: 8 mg/dL (ref 6–20)
CO2: 27 mmol/L (ref 22–32)
Calcium: 9.3 mg/dL (ref 8.9–10.3)
Chloride: 103 mmol/L (ref 98–111)
Creatinine, Ser: 0.76 mg/dL (ref 0.61–1.24)
GFR calc Af Amer: >60 mL/min (ref >60)
GFR calc non Af Amer: >60 mL/min (ref >60)
Glucose, Bld: 101 mg/dL — ABNORMAL HIGH (ref 70–99)
Potassium: 4.3 mmol/L (ref 3.5–5.1)
Sodium: 140 mmol/L (ref 135–145)
Total Bilirubin: 0.6 mg/dL (ref 0.3–1.2)
Total Protein: 7.5 g/dL (ref 6.5–8.1)

## 2019-10-09 LAB — TROPONIN I (HIGH SENSITIVITY)
Troponin I (High Sensitivity): 5 ng/L (ref <18)
Troponin I (High Sensitivity): 5 ng/L (ref <18)

## 2019-10-09 MED ORDER — SODIUM CHLORIDE 0.9% FLUSH
3.0000 mL | Freq: Once | INTRAVENOUS | Status: AC
  Administered 2019-10-09: 3 mL via INTRAVENOUS

## 2019-10-09 MED ORDER — ACETAMINOPHEN 500 MG PO TABS
1000.0000 mg | ORAL_TABLET | Freq: Once | ORAL | Status: AC
  Administered 2019-10-09: 1000 mg via ORAL
  Filled 2019-10-09: qty 2

## 2019-10-09 NOTE — ED Notes (Signed)
Pt is alert and oriented x 4 and is verbally responsive. Pt report mid-sternal chest pain 5/10.

## 2019-10-09 NOTE — ED Provider Notes (Signed)
[facility] DEPT Provider Note   CSN: [id number] Arrival date & time: 10/09/19  1132     History Chief Complaint  Patient presents with  . Chest Pain    Travis [name] is a 34 y.o. male.  HPI He presents for evaluation of anterior chest pain in the area of the sternum which started last night around 9 PM, without provocation.  The pain persisted, but did not bother him while he was sleeping.  At this time he complains of pain 5/10.  He states the pain was worse last night.  He reports that the pain is worse with palpation and deep breathing.  No known trauma.  He has noticed a sensation of shortness of breath and tingling in his left arm and left hand, today.  He denies fever, chills, cough, known Covid exposure, nausea, vomiting, weakness or dizziness.  No prior similar problems.  There are no other known modifying factors.    Past Medical History:  Diagnosis Date  . Asthma    childhood  . Colon polyps   . HTN (hypertension)     Patient Active Problem List   Diagnosis Date Noted  . Morbid obesity ([place]) 07/28/2019  . Left hip pain 07/28/2019    Past Surgical History:  Procedure Laterality Date  . arm Right   . TONSILLECTOMY    . WISDOM TOOTH EXTRACTION         Family History  Problem Relation Age of Onset  . Obesity Mother   . Mental illness Mother   . Irritable bowel syndrome Sister   . Hearing loss Maternal Grandmother     Social History   Tobacco Use  . Smoking status: Never Smoker  . Smokeless tobacco: Never Used  Substance Use Topics  . Alcohol use: Yes    Comment: on weekends  . Drug use: No    Home Medications Prior to Admission medications   Medication Sig Start Date End Date Taking? Authorizing Provider  ondansetron (ZOFRAN-ODT) 4 MG disintegrating tablet Take 1 tablet (4 mg total) by mouth every 8 (eight) hours as needed for nausea or vomiting. 08/23/19  Yes Shelda Pal, DO    Allergies    Patient  has no known allergies.  Review of Systems   Review of Systems  Unable to perform ROS: Mental status change  All other systems reviewed and are negative.   Physical Exam Updated Vital Signs BP (!) 155/96 (BP Location: Left Arm)   Pulse 78   Temp 98.7 F (37.1 C) (Oral)   Resp 18   SpO2 99%   Physical Exam Vitals and nursing note reviewed.  Constitutional:      Appearance: He is well-developed. He is obese. He is not ill-appearing, toxic-appearing or diaphoretic.  HENT:     Head: Normocephalic and atraumatic.     Right Ear: External ear normal.     Left Ear: External ear normal.  Eyes:     Conjunctiva/sclera: Conjunctivae normal.     Pupils: Pupils are equal, round, and reactive to light.  Neck:     Trachea: Phonation normal.  Cardiovascular:     Rate and Rhythm: Normal rate and regular rhythm.     Heart sounds: Normal heart sounds.     Comments: No tachycardia Pulmonary:     Effort: Pulmonary effort is normal.     Breath sounds: Normal breath sounds.     Comments: No tachypnea Abdominal:     General: There is no distension.  Palpations: Abdomen is soft.     Tenderness: There is no abdominal tenderness.  Musculoskeletal:        General: No swelling. Normal range of motion.     Cervical back: Normal range of motion and neck supple.  Skin:    General: Skin is warm and dry.     Coloration: Skin is not jaundiced.  Neurological:     Mental Status: He is alert and oriented to person, place, and time.     Cranial Nerves: No cranial nerve deficit.     Sensory: No sensory deficit.     Motor: No abnormal muscle tone.     Coordination: Coordination normal.  Psychiatric:        Mood and Affect: Mood normal.        Behavior: Behavior normal.        Thought Content: Thought content normal.        Judgment: Judgment normal.     ED Results / Procedures / Treatments   Labs (all labs ordered are listed, but only abnormal results are displayed) Labs Reviewed   COMPREHENSIVE METABOLIC PANEL - Abnormal; Notable for the following components:      Result Value   Glucose, Bld 101 (*)    All other components within normal limits  CBC WITH DIFFERENTIAL/PLATELET  TROPONIN I (HIGH SENSITIVITY)  TROPONIN I (HIGH SENSITIVITY)    EKG EKG Interpretation  Date/Time:  Saturday October 09 2019 11:44:54 EST Ventricular Rate:  82 PR Interval:    QRS Duration: 104 QT Interval:  359 QTC Calculation: 420 R Axis:   70 Text Interpretation: Sinus rhythm Borderline repolarization abnormality Baseline wander in lead(s) II III aVF V4 V5 No old tracing to compare Confirmed by Daleen Bo [phone]) on 10/09/2019 12:07:51 PM   Radiology DG Chest 2 View  Result Date: 10/09/2019 CLINICAL DATA:  Pt presents with c/o central chest pain that started last night. Pt reports some mild shortness of breath as well. H/o Asthma and HTN. Nonsmoker. EXAM: CHEST - 2 VIEW COMPARISON:  None FINDINGS: The heart size and mediastinal contours are within normal limits. The lungs are clear. No pneumothorax or pleural effusion. The visualized skeletal structures are unremarkable. IMPRESSION: No active cardiopulmonary disease. Electronically Signed   By: Audie Pinto M.D.   On: 10/09/2019 12:08    Procedures Procedures (including critical care time)  Medications Ordered in ED Medications  sodium chloride flush (NS) 0.9 % injection 3 mL (3 mLs Intravenous Given 10/09/19 1303)    ED Course  I have reviewed the triage vital signs and the nursing notes.  Pertinent labs & imaging results that were available during my care of the patient were reviewed by me and considered in my medical decision making (see chart for details).  Clinical Course as of Oct 08 1434  Sat Oct 09, 2019  1433 Normal  Troponin I (High Sensitivity) [EW]  1433 Normal  CBC with Differential [EW]  1433 Normal  Comprehensive metabolic panel(!) [EW]  99991111 No CHF or infiltrate, interpreted by me  DG Chest 2 View  [EW]    Clinical Course User Index [EW] Daleen Bo, MD   MDM Rules/Calculators/A&P                       Patient Vitals for the past 24 hrs:  BP Pulse Resp SpO2  10/09/19 1629 (!) 157/81 65 19 97 %  10/09/19 1530 (!) 143/75 77 (!) 24 98 %  10/09/19 1524 (!)  147/97 68 18 98 %    At D/C- Reevaluation with update and discussion. After initial assessment and treatment, an updated evaluation reveals no additional complaints, findings discussed and questions answered. Daleen Bo   Medical Decision Making: Nonspecific chest pain, most likely chest wall discomfort.  Doubt ACS, PE or pneumonia.  Reassuring evaluation in the emergency department.  Mild hypertension is present.   CRITICAL CARE-none Performed by: Daleen Bo   Nursing Notes Reviewed/ Care Coordinated Applicable Imaging Reviewed Interpretation of Laboratory Data incorporated into ED treatment  The patient appears reasonably screened and/or stabilized for discharge and I doubt any other medical condition or other [facility] requiring further screening, evaluation, or treatment in the ED at this time prior to discharge.  Plan: Home Medications-Tylenol for pain, OTC medicine as needed; Home Treatments-gradual advance diet and activity.  Use heat on sore area; return here if the recommended treatment, does not improve the symptoms; Recommended follow up-PCP follow-up 1 week and as needed.   Final Clinical Impression(s) / ED Diagnoses Final diagnoses:  Chest wall pain    Rx / DC Orders ED Discharge Orders    None       Daleen Bo, MD 10/10/19 1234

## 2019-10-09 NOTE — Discharge Instructions (Addendum)
The testing today did not show any serious problems with your heart or lungs.  For the discomfort try using Tylenol every 4 hours and heat on the sore area.  If your conditions worsen, return here.  If you are still uncomfortable next week, see your doctor for a checkup.

## 2019-10-09 NOTE — ED Triage Notes (Signed)
Pt presents with c/o chest pain that started last night, center of the chest. Pt reports some mild shortness of breath as well.

## 2019-10-11 ENCOUNTER — Encounter: Payer: Self-pay | Admitting: Family Medicine

## 2019-10-11 DIAGNOSIS — M9901 Segmental and somatic dysfunction of cervical region: Secondary | ICD-10-CM | POA: Diagnosis not present

## 2019-10-11 DIAGNOSIS — M9902 Segmental and somatic dysfunction of thoracic region: Secondary | ICD-10-CM | POA: Diagnosis not present

## 2019-10-11 DIAGNOSIS — M4722 Other spondylosis with radiculopathy, cervical region: Secondary | ICD-10-CM | POA: Diagnosis not present

## 2019-10-11 DIAGNOSIS — M9903 Segmental and somatic dysfunction of lumbar region: Secondary | ICD-10-CM | POA: Diagnosis not present

## 2019-10-12 DIAGNOSIS — M4722 Other spondylosis with radiculopathy, cervical region: Secondary | ICD-10-CM | POA: Diagnosis not present

## 2019-10-12 DIAGNOSIS — M9903 Segmental and somatic dysfunction of lumbar region: Secondary | ICD-10-CM | POA: Diagnosis not present

## 2019-10-12 DIAGNOSIS — M9902 Segmental and somatic dysfunction of thoracic region: Secondary | ICD-10-CM | POA: Diagnosis not present

## 2019-10-12 DIAGNOSIS — M9901 Segmental and somatic dysfunction of cervical region: Secondary | ICD-10-CM | POA: Diagnosis not present

## 2019-10-13 DIAGNOSIS — M9901 Segmental and somatic dysfunction of cervical region: Secondary | ICD-10-CM | POA: Diagnosis not present

## 2019-10-13 DIAGNOSIS — M9902 Segmental and somatic dysfunction of thoracic region: Secondary | ICD-10-CM | POA: Diagnosis not present

## 2019-10-13 DIAGNOSIS — M9903 Segmental and somatic dysfunction of lumbar region: Secondary | ICD-10-CM | POA: Diagnosis not present

## 2019-10-13 DIAGNOSIS — M4722 Other spondylosis with radiculopathy, cervical region: Secondary | ICD-10-CM | POA: Diagnosis not present

## 2019-10-18 DIAGNOSIS — M9902 Segmental and somatic dysfunction of thoracic region: Secondary | ICD-10-CM | POA: Diagnosis not present

## 2019-10-18 DIAGNOSIS — M9903 Segmental and somatic dysfunction of lumbar region: Secondary | ICD-10-CM | POA: Diagnosis not present

## 2019-10-18 DIAGNOSIS — M4722 Other spondylosis with radiculopathy, cervical region: Secondary | ICD-10-CM | POA: Diagnosis not present

## 2019-10-18 DIAGNOSIS — M9901 Segmental and somatic dysfunction of cervical region: Secondary | ICD-10-CM | POA: Diagnosis not present

## 2019-10-19 DIAGNOSIS — M9902 Segmental and somatic dysfunction of thoracic region: Secondary | ICD-10-CM | POA: Diagnosis not present

## 2019-10-19 DIAGNOSIS — M9903 Segmental and somatic dysfunction of lumbar region: Secondary | ICD-10-CM | POA: Diagnosis not present

## 2019-10-19 DIAGNOSIS — M9901 Segmental and somatic dysfunction of cervical region: Secondary | ICD-10-CM | POA: Diagnosis not present

## 2019-10-19 DIAGNOSIS — M4722 Other spondylosis with radiculopathy, cervical region: Secondary | ICD-10-CM | POA: Diagnosis not present

## 2019-10-20 DIAGNOSIS — M9903 Segmental and somatic dysfunction of lumbar region: Secondary | ICD-10-CM | POA: Diagnosis not present

## 2019-10-20 DIAGNOSIS — M9902 Segmental and somatic dysfunction of thoracic region: Secondary | ICD-10-CM | POA: Diagnosis not present

## 2019-10-20 DIAGNOSIS — M9901 Segmental and somatic dysfunction of cervical region: Secondary | ICD-10-CM | POA: Diagnosis not present

## 2019-10-20 DIAGNOSIS — M4722 Other spondylosis with radiculopathy, cervical region: Secondary | ICD-10-CM | POA: Diagnosis not present

## 2019-12-14 DIAGNOSIS — Z20828 Contact with and (suspected) exposure to other viral communicable diseases: Secondary | ICD-10-CM | POA: Diagnosis not present

## 2019-12-14 DIAGNOSIS — Z03818 Encounter for observation for suspected exposure to other biological agents ruled out: Secondary | ICD-10-CM | POA: Diagnosis not present

## 2019-12-22 ENCOUNTER — Other Ambulatory Visit: Payer: Self-pay | Admitting: Family Medicine

## 2019-12-22 MED ORDER — ONDANSETRON 4 MG PO TBDP: 4.0000 mg | ORAL_TABLET | Freq: Three times a day (TID) | ORAL | 0 refills | Status: DC | PRN

## 2020-01-05 ENCOUNTER — Encounter (INDEPENDENT_AMBULATORY_CARE_PROVIDER_SITE_OTHER): Payer: Self-pay | Admitting: Family Medicine

## 2020-01-05 ENCOUNTER — Ambulatory Visit (INDEPENDENT_AMBULATORY_CARE_PROVIDER_SITE_OTHER): Payer: BC Managed Care – PPO | Admitting: Family Medicine

## 2020-01-05 ENCOUNTER — Other Ambulatory Visit: Payer: Self-pay

## 2020-01-05 VITALS — BP 130/89 | HR 74 | Temp 98.1°F | Ht 75.0 in | Wt 399.0 lb

## 2020-01-05 DIAGNOSIS — Z6841 Body Mass Index (BMI) 40.0 and over, adult: Secondary | ICD-10-CM

## 2020-01-05 DIAGNOSIS — R0602 Shortness of breath: Secondary | ICD-10-CM

## 2020-01-05 DIAGNOSIS — R5383 Other fatigue: Secondary | ICD-10-CM

## 2020-01-05 DIAGNOSIS — Z1331 Encounter for screening for depression: Secondary | ICD-10-CM

## 2020-01-05 DIAGNOSIS — Z9189 Other specified personal risk factors, not elsewhere classified: Secondary | ICD-10-CM | POA: Diagnosis not present

## 2020-01-05 DIAGNOSIS — R030 Elevated blood-pressure reading, without diagnosis of hypertension: Secondary | ICD-10-CM

## 2020-01-05 DIAGNOSIS — E7849 Other hyperlipidemia: Secondary | ICD-10-CM | POA: Diagnosis not present

## 2020-01-05 DIAGNOSIS — H40013 Open angle with borderline findings, low risk, bilateral: Secondary | ICD-10-CM | POA: Diagnosis not present

## 2020-01-05 DIAGNOSIS — Z0289 Encounter for other administrative examinations: Secondary | ICD-10-CM

## 2020-01-05 NOTE — Progress Notes (Signed)
Dear Dr. Riki [name],   Thank you for referring Travis [name] to our clinic. The following note includes my evaluation and treatment recommendations.  Chief Complaint:   OBESITY Travis [name] (MR# [id number]) is a 34 y.o. male who presents for evaluation and treatment of obesity and related comorbidities. Current BMI is Body mass index is 49.87 kg/m.Marland Kitchen Travis [name] has been struggling with his weight for many years and has been unsuccessful in either losing weight, maintaining weight loss, or reaching his healthy weight goal.  Travis [name] is currently in the action stage of change and ready to dedicate time achieving and maintaining a healthier weight. Travis [name] is interested in becoming our patient and working on intensive lifestyle modifications including (but not limited to) diet and exercise for weight loss.  Travis [name] is often skipping breakfast and lunch. Breakfast may be a sweet 'n salty granola bar; for lunch between 1-2 p.m. he will have 2 Crafted Tacos or Kabuto hibachi shrimp or chicken and rice (eat all) or Mexican rice and chicken; for dinner between 6:30-7 p.m. he will have grilled meat (1/2 thigh and 1/2 breast) with 1 1/2-2 cups of rice and 2 cups of vegetables.  Travis [name]'s habits were reviewed today and are as follows: his desired weight loss is 139 lbs, he has been heavy most of his life, he started gaining weight during and after college, his heaviest weight ever was 413 pounds, he craves flavorful foods, he skips breakfast and/or lunch frequently, he frequently makes poor food choices and he frequently eats larger portions than normal.  Depression Screen Travis [name]'s Food and Mood (modified PHQ-9) score was 7.  Depression screen PHQ 2/9 01/05/2020  Decreased Interest 1  Down, Depressed, Hopeless 1  PHQ - 2 Score 2  Altered sleeping 1  Tired, decreased energy 1  Change in appetite 0  Feeling bad or failure about yourself  0  Trouble concentrating 1  Moving slowly or  fidgety/restless 2  Suicidal thoughts 0  PHQ-9 Score 7  Difficult doing work/chores Somewhat difficult   Subjective:   Other fatigue. Travis [name] admits to daytime somnolence and denies waking up still tired. Travis [name] has a history of symptoms of daytime fatigue and Epworth sleepiness scale. Travis [name] generally gets 5-7 hours of sleep per night, and states that he does not sleep well most nights. Snoring is present. Apneic episodes are not present. Epworth Sleepiness Score is 10.  SOB (shortness of breath) on exertion. Travis [name] notes increasing shortness of breath with exercising and seems to be worsening over time with weight gain. He notes getting out of breath sooner with activity than he used to. This has gotten worse recently. Travis [name] denies shortness of breath at rest or orthopnea.  Elevated blood pressure reading. Previously blood pressure was elevated - is controlled today. No chest pain, chest pressure, or headache.   Other hyperlipidemia. Travis [name] has hyperlipidemia and has been trying to improve his cholesterol levels with intensive lifestyle modification including a low saturated fat diet, exercise and weight loss. He denies any chest pain, claudication or myalgias. He reports LDL previously elevated >140. He is on no medications.  Lab Results  Component Value Date   ALT 39 10/09/2019   AST 21 10/09/2019   ALKPHOS 43 10/09/2019   BILITOT 0.6 10/09/2019   Lab Results  Component Value Date   CHOL 201 (H) 07/28/2019   HDL 48.40 07/28/2019   LDLCALC 141 (H) 07/28/2019   TRIG 59.0 07/28/2019   CHOLHDL 4 07/28/2019   Depression screening.  Travis [name] had a mildly positive depression screen with a PHQ-9 score of 7.  At risk for heart disease. Travis [name] is at a higher than average risk for cardiovascular disease due to obesity.   Assessment/Plan:   Other fatigue. Travis [name] does feel that his weight is causing his energy to be lower than it should be. Fatigue may be related to obesity,  depression or many other causes. Labs will be ordered, and in the meanwhile, [name] will focus on self care including making healthy food choices, increasing physical activity and focusing on stress reduction. EKG 12-Lead showed NSR at 68 BPM. Comprehensive metabolic panel, Hemoglobin A1c, Insulin, random, VITAMIN D 25 Hydroxy (Vit-D Deficiency, Fractures), Vitamin B12, Folate, T3, T4, free, TSH labs ordered today.  SOB (shortness of breath) on exertion. Travis [name] does feel that he gets out of breath more easily that he used to when he exercises. Travis [name]'s shortness of breath appears to be obesity related and exercise induced. He has agreed to work on weight loss and gradually increase exercise to treat his exercise induced shortness of breath. Will continue to monitor closely.  Elevated blood pressure reading. Travis [name] will have labs checked today.  Other hyperlipidemia. Cardiovascular risk and specific lipid/LDL goals reviewed.  We discussed several lifestyle modifications today and Travis [name] will continue to work on diet, exercise and weight loss efforts. Orders and follow up as documented in patient record. Lipid Panel With LDL/HDL Ratio labs ordered today.  Counseling Intensive lifestyle modifications are the first line treatment for this issue. . Dietary changes: Increase soluble fiber. Decrease simple carbohydrates. . Exercise changes: Moderate to vigorous-intensity aerobic activity 150 minutes per week if tolerated. . Lipid-lowering medications: see documented in medical record.       Depression screening. Travis [name] had a positive depression screening. Depression is commonly associated with obesity and often results in emotional eating behaviors. We will monitor this closely and work on CBT to help improve the non-hunger eating patterns. Referral to Psychology may be required if no improvement is seen as he continues in our clinic.  At risk for heart disease. Travis [name] was given approximately 15  minutes of coronary artery disease prevention counseling today. He is 34 y.o. male and has risk factors for heart disease including obesity. We discussed intensive lifestyle modifications today with an emphasis on specific weight loss instructions and strategies.   Repetitive spaced learning was employed today to elicit superior memory formation and behavioral change.  Class 3 severe obesity with serious comorbidity and body mass index (BMI) of 45.0 to 49.9 in adult, unspecified obesity type ([place]).  Travis [name] is currently in the action stage of change and his goal is to continue with weight loss efforts. I recommend Travis [name] begin the structured treatment plan as follows:  He has agreed to the Category 4 Plan.  Exercise goals: Travis [name] will continue his current exercise regimen.  Behavioral modification strategies: increasing lean protein intake, increasing vegetables, meal planning and cooking strategies, keeping healthy foods in the home and planning for success.  He was informed of the importance of frequent follow-up visits to maximize his success with intensive lifestyle modifications for his multiple health conditions. He was informed we would discuss his lab results at his next visit unless there is a critical issue that needs to be addressed sooner. Travis [name] agreed to keep his next visit at the agreed upon time to discuss these results.  Objective:   Blood pressure 130/89, pulse 74, temperature 98.1 F (36.7 C), temperature source Oral, height 6\' 3"  (1.905  m), weight (!) 399 lb (181 kg), SpO2 96 %. Body mass index is 49.87 kg/m.  EKG: Normal sinus rhythm, rate 68 BPM. Within normal limits.  Indirect Calorimeter completed today shows a VO2 of 350 and a REE of 2436.  His calculated basal metabolic rate is 99991111 thus his basal metabolic rate is worse than expected.  General: Cooperative, alert, well developed, in no acute distress. HEENT: Conjunctivae and lids unremarkable. Cardiovascular:  Regular rhythm.  Lungs: Normal work of breathing. Neurologic: No focal deficits.   Lab Results  Component Value Date   CREATININE 0.76 10/09/2019   BUN 8 10/09/2019   NA 140 10/09/2019   K 4.3 10/09/2019   CL 103 10/09/2019   CO2 27 10/09/2019   Lab Results  Component Value Date   ALT 39 10/09/2019   AST 21 10/09/2019   ALKPHOS 43 10/09/2019   BILITOT 0.6 10/09/2019   Lab Results  Component Value Date   HGBA1C 5.6 07/29/2019   No results found for: INSULIN No results found for: TSH Lab Results  Component Value Date   CHOL 201 (H) 07/28/2019   HDL 48.40 07/28/2019   LDLCALC 141 (H) 07/28/2019   TRIG 59.0 07/28/2019   CHOLHDL 4 07/28/2019   Lab Results  Component Value Date   WBC 5.6 10/09/2019   HGB 15.5 10/09/2019   HCT 48.0 10/09/2019   MCV 85.3 10/09/2019   PLT 218 10/09/2019   No results found for: IRON, TIBC, FERRITIN  Attestation Statements:   This is the patient's first visit at [facility]. The patient's NEW PATIENT PACKET was reviewed at length. Included in the packet: current and past health history, medications, allergies, ROS, gynecologic history (women only), surgical history, family history, social history, weight history, weight loss surgery history (for those that have had weight loss surgery), nutritional evaluation, mood and food questionnaire, PHQ9, Epworth questionnaire, sleep habits questionnaire, patient life and health improvement goals questionnaire. These will all be scanned into the patient's chart under media.   During the visit, I independently reviewed the patient's EKG, bioimpedance scale results, and indirect calorimeter results. I used this information to tailor a meal plan for the patient that will help him to lose weight and will improve his obesity-related conditions going forward. I performed a medically necessary appropriate examination and/or evaluation. I discussed the assessment and treatment plan with the patient.  The patient was provided an opportunity to ask questions and all were answered. The patient agreed with the plan and demonstrated an understanding of the instructions. Labs were ordered at this visit and will be reviewed at the next visit unless more critical results need to be addressed immediately. Clinical information was updated and documented in the EMR.   Time spent on visit including pre-visit chart review and post-visit care was 45 minutes.   A separate 15 minutes was spent on risk counseling (see above).    I, Michaelene Song, am acting as transcriptionist for Coralie Common, MD   I have reviewed the above documentation for accuracy and completeness, and I agree with the above. - Ilene Qua, MD

## 2020-01-12 ENCOUNTER — Encounter (INDEPENDENT_AMBULATORY_CARE_PROVIDER_SITE_OTHER): Payer: Self-pay | Admitting: Family Medicine

## 2020-01-13 LAB — COMPREHENSIVE METABOLIC PANEL

## 2020-01-13 LAB — LIPID PANEL WITH LDL/HDL RATIO

## 2020-01-13 LAB — HEMOGLOBIN A1C
Est. average glucose Bld gHb Est-mCnc: 111 mg/dL
Hgb A1c MFr Bld: 5.5 % (ref 4.8–5.6)

## 2020-01-13 LAB — FOLATE

## 2020-01-13 LAB — VITAMIN B12

## 2020-01-13 LAB — VITAMIN D 25 HYDROXY (VIT D DEFICIENCY, FRACTURES)

## 2020-01-13 LAB — INSULIN, RANDOM

## 2020-01-13 LAB — T4, FREE

## 2020-01-13 LAB — T3

## 2020-01-13 LAB — TSH

## 2020-01-18 ENCOUNTER — Encounter (INDEPENDENT_AMBULATORY_CARE_PROVIDER_SITE_OTHER): Payer: Self-pay | Admitting: Family Medicine

## 2020-01-18 NOTE — Telephone Encounter (Signed)
Please advise 

## 2020-01-19 ENCOUNTER — Ambulatory Visit (INDEPENDENT_AMBULATORY_CARE_PROVIDER_SITE_OTHER): Payer: Self-pay | Admitting: Family Medicine

## 2020-01-31 ENCOUNTER — Ambulatory Visit (INDEPENDENT_AMBULATORY_CARE_PROVIDER_SITE_OTHER): Payer: BC Managed Care – PPO | Admitting: Family Medicine

## 2020-01-31 ENCOUNTER — Other Ambulatory Visit: Payer: Self-pay

## 2020-01-31 ENCOUNTER — Encounter (INDEPENDENT_AMBULATORY_CARE_PROVIDER_SITE_OTHER): Payer: Self-pay | Admitting: Family Medicine

## 2020-01-31 VITALS — BP 144/79 | HR 73 | Temp 98.0°F | Ht 75.0 in | Wt 383.0 lb

## 2020-01-31 DIAGNOSIS — I10 Essential (primary) hypertension: Secondary | ICD-10-CM | POA: Diagnosis not present

## 2020-01-31 DIAGNOSIS — E559 Vitamin D deficiency, unspecified: Secondary | ICD-10-CM | POA: Diagnosis not present

## 2020-01-31 DIAGNOSIS — R0602 Shortness of breath: Secondary | ICD-10-CM

## 2020-01-31 DIAGNOSIS — R5383 Other fatigue: Secondary | ICD-10-CM

## 2020-01-31 DIAGNOSIS — Z6841 Body Mass Index (BMI) 40.0 and over, adult: Secondary | ICD-10-CM

## 2020-02-01 LAB — COMPREHENSIVE METABOLIC PANEL
ALT: 49 IU/L — ABNORMAL HIGH (ref 0–44)
AST: 24 IU/L (ref 0–40)
Albumin/Globulin Ratio: 1.8 (ref 1.2–2.2)
Albumin: 4.8 g/dL (ref 4.0–5.0)
Alkaline Phosphatase: 50 IU/L (ref 39–117)
BUN/Creatinine Ratio: 11 (ref 9–20)
BUN: 9 mg/dL (ref 6–20)
Bilirubin Total: 0.5 mg/dL (ref 0.0–1.2)
CO2: 22 mmol/L (ref 20–29)
Calcium: 9.7 mg/dL (ref 8.7–10.2)
Chloride: 103 mmol/L (ref 96–106)
Creatinine, Ser: 0.85 mg/dL (ref 0.76–1.27)
GFR calc Af Amer: 132 mL/min/{1.73_m2} (ref >59)
GFR calc non Af Amer: 114 mL/min/{1.73_m2} (ref >59)
Globulin, Total: 2.6 g/dL (ref 1.5–4.5)
Glucose: 88 mg/dL (ref 65–99)
Potassium: 4.3 mmol/L (ref 3.5–5.2)
Sodium: 138 mmol/L (ref 134–144)
Total Protein: 7.4 g/dL (ref 6.0–8.5)

## 2020-02-01 LAB — CBC WITH DIFFERENTIAL/PLATELET
Basophils Absolute: 0 10*3/uL (ref 0.0–0.2)
Basos: 1 %
EOS (ABSOLUTE): 0.1 10*3/uL (ref 0.0–0.4)
Eos: 2 %
Hematocrit: 45.9 % (ref 37.5–51.0)
Hemoglobin: 15.6 g/dL (ref 13.0–17.7)
Immature Grans (Abs): 0 10*3/uL (ref 0.0–0.1)
Immature Granulocytes: 0 %
Lymphocytes Absolute: 1 10*3/uL (ref 0.7–3.1)
Lymphs: 25 %
MCH: 28.3 pg (ref 26.6–33.0)
MCHC: 34 g/dL (ref 31.5–35.7)
MCV: 83 fL (ref 79–97)
Monocytes Absolute: 0.4 10*3/uL (ref 0.1–0.9)
Monocytes: 9 %
Neutrophils Absolute: 2.4 10*3/uL (ref 1.4–7.0)
Neutrophils: 63 %
Platelets: 204 10*3/uL (ref 150–450)
RBC: 5.52 x10E6/uL (ref 4.14–5.80)
RDW: 13.1 % (ref 11.6–15.4)
WBC: 3.9 10*3/uL (ref 3.4–10.8)

## 2020-02-01 LAB — LIPID PANEL WITH LDL/HDL RATIO
Cholesterol, Total: 191 mg/dL (ref 100–199)
HDL: 50 mg/dL (ref >39)
LDL Chol Calc (NIH): 129 mg/dL — ABNORMAL HIGH (ref 0–99)
LDL/HDL Ratio: 2.6 ratio (ref 0.0–3.6)
Triglycerides: 63 mg/dL (ref 0–149)
VLDL Cholesterol Cal: 12 mg/dL (ref 5–40)

## 2020-02-01 LAB — INSULIN, RANDOM: INSULIN: 15.6 u[IU]/mL (ref 2.6–24.9)

## 2020-02-01 LAB — SAR COV2 SEROLOGY (COVID19)AB(IGG),IA: DiaSorin SARS-CoV-2 Ab, IgG: NEGATIVE

## 2020-02-01 LAB — T4, FREE: Free T4: 1.49 ng/dL (ref 0.82–1.77)

## 2020-02-01 LAB — VITAMIN B12: Vitamin B-12: 731 pg/mL (ref 232–1245)

## 2020-02-01 LAB — TSH: TSH: 1.58 u[IU]/mL (ref 0.450–4.500)

## 2020-02-01 LAB — FOLATE: Folate: 12 ng/mL (ref >3.0)

## 2020-02-01 LAB — VITAMIN D 25 HYDROXY (VIT D DEFICIENCY, FRACTURES): Vit D, 25-Hydroxy: 22.8 ng/mL — ABNORMAL LOW (ref 30.0–100.0)

## 2020-02-01 LAB — T3: T3, Total: 124 ng/dL (ref 71–180)

## 2020-02-01 NOTE — Progress Notes (Signed)
Chief Complaint:   OBESITY Travis [name] is here to discuss his progress with his obesity treatment plan along with follow-up of his obesity related diagnoses. Travis [name] is on the Category 4 Plan and states he is following his eating plan approximately 95% of the time. Travis [name] states he is going to the gym/stationary bike/weights 60 minutes 2-4 times per week.  Today's visit was #: 2 Starting weight: 399 lbs Starting date: 01/05/2020 Today's weight: 383 lbs Today's date: 01/31/2020 Total lbs lost to date: 16 Total lbs lost since last in-office visit: 16  Interim History: Travis [name] stuck to the plan most of the time - only ate out a handful of times. He denies hunger in the beginning; he felt it was too much food. He reports getting in about 9-10 oz at dinner. Snack calories are used on humus and crackers and [place] yogurt bars. He is craving pasta.  Subjective:   Essential hypertension. Blood pressure is elevated today, but previously controlled. No chest pain, chest pressure, or headache.  BP Readings from Last 3 Encounters:  01/31/20 (!) 144/79  01/05/20 130/89  10/09/19 (!) 157/81   Lab Results  Component Value Date   CREATININE 0.85 01/31/2020   CREATININE CANCELED 01/05/2020   CREATININE 0.76 10/09/2019   Vitamin D deficiency. Diagnosis is likely due to obesity. Travis [name] is not on Vitamin D replacement.  SOB (shortness of breath) on exertion. This is unchanged from his first appointment. Last labs were in January 2021.  Other fatigue. Fatigue is unchanged from his first appointment 01/05/2020.  Assessment/Plan:   Essential hypertension. [name] is working on healthy weight loss and exercise to improve blood pressure control. We will watch for signs of hypotension as he continues his lifestyle modifications. Will follow-up blood pressure at his next appointment. Comprehensive metabolic panel, CBC with Differential/Platelet, Insulin, random, Lipid Panel With LDL/HDL Ratio  repeat labs ordered.  Vitamin D deficiency. Low Vitamin D level contributes to fatigue and are associated with obesity, breast, and colon cancer. VITAMIN D 25 Hydroxy (Vit-D Deficiency, Fractures), Vitamin B12 levels ordered today.  SOB (shortness of breath) on exertion. Travis [name]'s shortness of breath appears to be obesity related and exercise induced. He has agreed to work on weight loss and gradually increase exercise to treat his exercise induced shortness of breath. Will continue to monitor closely. CBC with Differential/Platelet, Insulin, random, Lipid Panel With LDL/HDL Ratio, Folate, T3, T4, free, TSH, SAR CoV2 Serology (COVID 19)AB(IGG)IA ordered today.  Other fatigue. Fatigue may be related to obesity, depression or many other causes. Labs will be ordered, and in the meanwhile, Travis [name] will focus on self care including making healthy food choices, increasing physical activity and focusing on stress reduction. SAR CoV2 Serology (COVID 19)AB(IGG)IA ordered today.  Class 3 severe obesity with serious comorbidity and body mass index (BMI) of 45.0 to 49.9 in adult, unspecified obesity type (Travis [name]).   Travis [name] is currently in the action stage of change. As such, his goal is to continue with weight loss efforts. He has agreed to the Category 4 Plan.   Exercise goals: Travis [name] will continue his current exercise regimen.  Behavioral modification strategies: increasing lean protein intake, increasing vegetables, decreasing eating out, meal planning and cooking strategies, keeping healthy foods in the home and planning for success.  Travis [name] has agreed to follow-up with our clinic in 2 weeks. He was informed of the importance of frequent follow-up visits to maximize his success with intensive lifestyle modifications for his multiple health conditions.  Travis [name] was informed we would discuss his lab results at his next visit unless there is a critical issue that needs to be addressed sooner. Travis [name] agreed  to keep his next visit at the agreed upon time to discuss these results.  Objective:   Blood pressure (!) 144/79, pulse 73, temperature 98 F (36.7 C), temperature source Oral, height 6\' 3"  (1.905 m), weight (!) 383 lb (173.7 kg), SpO2 92 %. Body mass index is 47.87 kg/m.  General: Cooperative, alert, well developed, in no acute distress. HEENT: Conjunctivae and lids unremarkable. Cardiovascular: Regular rhythm.  Lungs: Normal work of breathing. Neurologic: No focal deficits.   Lab Results  Component Value Date   CREATININE 0.85 01/31/2020   BUN 9 01/31/2020   NA 138 01/31/2020   K 4.3 01/31/2020   CL 103 01/31/2020   CO2 22 01/31/2020   Lab Results  Component Value Date   ALT 49 (H) 01/31/2020   AST 24 01/31/2020   ALKPHOS 50 01/31/2020   BILITOT 0.5 01/31/2020   Lab Results  Component Value Date   HGBA1C 5.5 01/05/2020   HGBA1C 5.6 07/29/2019   Lab Results  Component Value Date   INSULIN 15.6 01/31/2020   INSULIN CANCELED 01/05/2020   Lab Results  Component Value Date   TSH 1.580 01/31/2020   Lab Results  Component Value Date   CHOL 191 01/31/2020   HDL 50 01/31/2020   LDLCALC 129 (H) 01/31/2020   TRIG 63 01/31/2020   CHOLHDL 4 07/28/2019   Lab Results  Component Value Date   WBC 3.9 01/31/2020   HGB 15.6 01/31/2020   HCT 45.9 01/31/2020   MCV 83 01/31/2020   PLT 204 01/31/2020   No results found for: IRON, TIBC, FERRITIN  Attestation Statements:   Reviewed by clinician on day of visit: allergies, medications, problem list, medical history, surgical history, family history, social history, and previous encounter notes.  Time spent on visit including pre-visit chart review and post-visit charting and care was 15 minutes.   I, Michaelene Song, am acting as transcriptionist for Coralie Common, MD   I have reviewed the above documentation for accuracy and completeness, and I agree with the above. - Ilene Qua, MD

## 2020-02-15 ENCOUNTER — Ambulatory Visit (INDEPENDENT_AMBULATORY_CARE_PROVIDER_SITE_OTHER): Payer: BC Managed Care – PPO | Admitting: Family Medicine

## 2020-02-15 ENCOUNTER — Encounter (INDEPENDENT_AMBULATORY_CARE_PROVIDER_SITE_OTHER): Payer: Self-pay | Admitting: Family Medicine

## 2020-02-15 ENCOUNTER — Other Ambulatory Visit: Payer: Self-pay

## 2020-02-15 VITALS — BP 140/80 | HR 91 | Temp 98.3°F | Ht 75.0 in | Wt 383.0 lb

## 2020-02-15 DIAGNOSIS — E8881 Metabolic syndrome: Secondary | ICD-10-CM

## 2020-02-15 DIAGNOSIS — Z9189 Other specified personal risk factors, not elsewhere classified: Secondary | ICD-10-CM | POA: Diagnosis not present

## 2020-02-15 DIAGNOSIS — E88819 Insulin resistance, unspecified: Secondary | ICD-10-CM

## 2020-02-15 DIAGNOSIS — E559 Vitamin D deficiency, unspecified: Secondary | ICD-10-CM | POA: Diagnosis not present

## 2020-02-15 DIAGNOSIS — E7849 Other hyperlipidemia: Secondary | ICD-10-CM

## 2020-02-15 DIAGNOSIS — Z6841 Body Mass Index (BMI) 40.0 and over, adult: Secondary | ICD-10-CM

## 2020-02-15 MED ORDER — VITAMIN D (ERGOCALCIFEROL) 1.25 MG (50000 UNIT) PO CAPS: 50000.0000 [IU] | ORAL_CAPSULE | ORAL | 0 refills | Status: DC

## 2020-02-16 NOTE — Progress Notes (Signed)
Chief Complaint:   OBESITY Travis [name] is here to discuss his progress with his obesity treatment plan along with follow-up of his obesity related diagnoses. [name] is on the Category 4 Plan and states he is following his eating plan approximately 70% of the time. [name] states he is going to the gym for 45-60 minutes 3-4 times per week.  Today's visit was #: 3 Starting weight: 399 lbs Starting date: 01/05/2020 Today's weight: 383 lbs Today's date: 02/15/2020 Total lbs lost to date: 16 lbs Total lbs lost since last in-office visit: 0  Interim History: Travis [name] states last week was difficult and then he celebrated his friend's birthday over the weekend and drank his fair share of beer.  He mentions that he needs to get back to sticking with the plan.  He noticed an increase in hunger when not following the plan as strictly.  Subjective:   1. Other hyperlipidemia [name] has hyperlipidemia and has been trying to improve his cholesterol levels with intensive lifestyle modification including a low saturated fat diet, exercise and weight loss. He denies any chest pain, claudication or myalgias.  Cannot risk stratify as patient is only 82.  Lab Results  Component Value Date   ALT 49 (H) 01/31/2020   AST 24 01/31/2020   ALKPHOS 50 01/31/2020   BILITOT 0.5 01/31/2020   Lab Results  Component Value Date   CHOL 191 01/31/2020   HDL 50 01/31/2020   LDLCALC 129 (H) 01/31/2020   TRIG 63 01/31/2020   CHOLHDL 4 07/28/2019   2. Vitamin D deficiency Travis [name]'s Vitamin D level was 22.8 on 01/31/2020. He is currently taking no vitamin D supplement.  He endorses fatigue.  3. Insulin resistance Travis [name] has a diagnosis of insulin resistance based on his elevated fasting insulin level >5. He continues to work on diet and exercise to decrease his risk of diabetes.  He is not on medication.  Lab Results  Component Value Date   INSULIN 15.6 01/31/2020   INSULIN CANCELED 01/05/2020   Lab Results    Component Value Date   HGBA1C 5.5 01/05/2020   4. At risk for osteoporosis Travis [name] is at higher risk of osteopenia and osteoporosis due to Vitamin D deficiency.   Assessment/Plan:   1. Other hyperlipidemia Cardiovascular risk and specific lipid/LDL goals reviewed.  We discussed several lifestyle modifications today and Jacson will continue to work on diet, exercise and weight loss efforts. Orders and follow up as documented in patient record.  Repeat labs in 3 months.   Counseling Intensive lifestyle modifications are the first line treatment for this issue. . Dietary changes: Increase soluble fiber. Decrease simple carbohydrates. . Exercise changes: Moderate to vigorous-intensity aerobic activity 150 minutes per week if tolerated. . Lipid-lowering medications: see documented in medical record.  2. Vitamin D deficiency Low Vitamin D level contributes to fatigue and are associated with obesity, breast, and colon cancer. He agrees to start to take prescription Vitamin D @50 ,000 IU every week and will follow-up for routine testing of Vitamin D, at least 2-3 times per year to avoid over-replacement. - Vitamin D, Ergocalciferol, (DRISDOL) 1.25 MG (50000 UNIT) CAPS capsule; Take 1 capsule (50,000 Units total) by mouth every 7 (seven) days.  Dispense: 4 capsule; Refill: 0  3. Insulin resistance Travis [name] will continue to work on weight loss, exercise, and decreasing simple carbohydrates to help decrease the risk of diabetes. Travis [name] agreed to follow-up with [name] as directed to closely monitor his progress.  Repeat labs in 3  months.  4. At risk for osteoporosis Travis [name] was given approximately 15 minutes of osteoporosis prevention counseling today. Travis [name] is at risk for osteopenia and osteoporosis due to his Vitamin D deficiency. He was encouraged to take his Vitamin D and follow his higher calcium diet and increase strengthening exercise to help strengthen his bones and decrease his risk of  osteopenia and osteoporosis.  Repetitive spaced learning was employed today to elicit superior memory formation and behavioral change.  5. Class 3 severe obesity with serious comorbidity and body mass index (BMI) of 45.0 to 49.9 in adult, unspecified obesity type [facility]) Travis [name] is currently in the action stage of change. As such, his goal is to continue with weight loss efforts. He has agreed to the Category 4 Plan.   Exercise goals: As is.  Behavioral modification strategies: increasing lean protein intake, increasing vegetables, meal planning and cooking strategies, keeping healthy foods in the home and planning for success.  [name] has agreed to follow-up with our clinic in 2 weeks. He was informed of the importance of frequent follow-up visits to maximize his success with intensive lifestyle modifications for his multiple health conditions.   Objective:   Blood pressure 140/80, pulse 91, temperature 98.3 F (36.8 C), temperature source Oral, height 6\' 3"  (1.905 m), weight (!) 383 lb (173.7 kg), SpO2 95 %. Body mass index is 47.87 kg/m.  General: Cooperative, alert, well developed, in no acute distress. HEENT: Conjunctivae and lids unremarkable. Cardiovascular: Regular rhythm.  Lungs: Normal work of breathing. Neurologic: No focal deficits.   Lab Results  Component Value Date   CREATININE 0.85 01/31/2020   BUN 9 01/31/2020   NA 138 01/31/2020   K 4.3 01/31/2020   CL 103 01/31/2020   CO2 22 01/31/2020   Lab Results  Component Value Date   ALT 49 (H) 01/31/2020   AST 24 01/31/2020   ALKPHOS 50 01/31/2020   BILITOT 0.5 01/31/2020   Lab Results  Component Value Date   HGBA1C 5.5 01/05/2020   HGBA1C 5.6 07/29/2019   Lab Results  Component Value Date   INSULIN 15.6 01/31/2020   INSULIN CANCELED 01/05/2020   Lab Results  Component Value Date   TSH 1.580 01/31/2020   Lab Results  Component Value Date   CHOL 191 01/31/2020   HDL 50 01/31/2020   LDLCALC 129 (H)  01/31/2020   TRIG 63 01/31/2020   CHOLHDL 4 07/28/2019   Lab Results  Component Value Date   WBC 3.9 01/31/2020   HGB 15.6 01/31/2020   HCT 45.9 01/31/2020   MCV 83 01/31/2020   PLT 204 01/31/2020   Attestation Statements:   Reviewed by clinician on day of visit: allergies, medications, problem list, medical history, surgical history, family history, social history, and previous encounter notes.  I, Water quality scientist, CMA, am acting as transcriptionist for Coralie Common, MD.  I have reviewed the above documentation for accuracy and completeness, and I agree with the above. - Jinny Blossom, MD

## 2020-03-02 ENCOUNTER — Encounter (INDEPENDENT_AMBULATORY_CARE_PROVIDER_SITE_OTHER): Payer: Self-pay | Admitting: Family Medicine

## 2020-03-02 ENCOUNTER — Other Ambulatory Visit: Payer: Self-pay

## 2020-03-02 ENCOUNTER — Ambulatory Visit (INDEPENDENT_AMBULATORY_CARE_PROVIDER_SITE_OTHER): Payer: BC Managed Care – PPO | Admitting: Family Medicine

## 2020-03-02 VITALS — BP 140/88 | HR 71 | Temp 98.2°F | Ht 75.0 in | Wt 376.0 lb

## 2020-03-02 DIAGNOSIS — Z6841 Body Mass Index (BMI) 40.0 and over, adult: Secondary | ICD-10-CM | POA: Diagnosis not present

## 2020-03-02 DIAGNOSIS — I10 Essential (primary) hypertension: Secondary | ICD-10-CM | POA: Diagnosis not present

## 2020-03-02 DIAGNOSIS — E559 Vitamin D deficiency, unspecified: Secondary | ICD-10-CM

## 2020-03-07 NOTE — Progress Notes (Signed)
Chief Complaint:   OBESITY Travis [name] is here to discuss his progress with his obesity treatment plan along with follow-up of his obesity related diagnoses. Campbell is on the Category 4 Plan and states he is following his eating plan approximately 85% of the time. Harbert states he is going to the gym 60-75 minutes 3-4 times per week.  Today's visit was #: 4 Starting weight: 399 lbs Starting date: 01/05/2020 Today's weight: 376 lbs Today's date: 03/02/2020 Total lbs lost to date: 23 Total lbs lost since last in-office visit: 7  Interim History: Travis [name] is looking for a little variety on his meal plan. He was not previously a breakfast eater and voices he would like a non-egg option. He denies hunger. He reports getting 8-10 oz at dinner. He has no plans for the [facility] Day weekend and reports no obstacles in the upcoming weeks.  Subjective:   Essential hypertension. Blood pressure is elevated today - is borderline at 140/88. [name] is on no medication.  BP Readings from Last 3 Encounters:  03/02/20 140/88  02/15/20 140/80  01/31/20 (!) 144/79   Lab Results  Component Value Date   CREATININE 0.85 01/31/2020   CREATININE CANCELED 01/05/2020   CREATININE 0.76 10/09/2019   Vitamin D deficiency. No nausea, vomiting, or muscle weakness. [name] endorses fatigue. He is on prescription Vitamin D. Last Vitamin D 22.8 on 01/31/2020.  Assessment/Plan:   Essential hypertension. Shaka is working on healthy weight loss and exercise to improve blood pressure control. We will watch for signs of hypotension as he continues his lifestyle modifications. Will follow-up at his next appointment and will hold off start medication.  Vitamin D deficiency. Low Vitamin D level contributes to fatigue and are associated with obesity, breast, and colon cancer. He agrees to continue to take prescription Vitamin D (no refill needed) and will follow-up for routine testing of Vitamin D, at least  2-3 times per year to avoid over-replacement.  Class 3 severe obesity with serious comorbidity and body mass index (BMI) of 45.0 to 49.9 in adult, unspecified obesity type ([place]).  Travis [name] is currently in the action stage of change. As such, his goal is to continue with weight loss efforts. He has agreed to the Category 4 Plan and will journal 350-500 calories and 30+ grams of protein at breakfast.   Exercise goals: For substantial health benefits, adults should do at least 150 minutes (2 hours and 30 minutes) a week of moderate-intensity, or 75 minutes (1 hour and 15 minutes) a week of vigorous-intensity aerobic physical activity, or an equivalent combination of moderate- and vigorous-intensity aerobic activity. Aerobic activity should be performed in episodes of at least 10 minutes, and preferably, it should be spread throughout the week.  Behavioral modification strategies: increasing lean protein intake, meal planning and cooking strategies, keeping healthy foods in the home and planning for success.  Travis [name] has agreed to follow-up with our clinic in 2-3 weeks. He was informed of the importance of frequent follow-up visits to maximize his success with intensive lifestyle modifications for his multiple health conditions.   Objective:   Blood pressure 140/88, pulse 71, temperature 98.2 F (36.8 C), temperature source Oral, height 6\' 3"  (1.905 m), weight (!) 376 lb (170.6 kg), SpO2 96 %. Body mass index is 47 kg/m.  General: Cooperative, alert, well developed, in no acute distress. HEENT: Conjunctivae and lids unremarkable. Cardiovascular: Regular rhythm.  Lungs: Normal work of breathing. Neurologic: No focal deficits.   Lab Results  Component Value Date   CREATININE 0.85 01/31/2020   BUN 9 01/31/2020   NA 138 01/31/2020   K 4.3 01/31/2020   CL 103 01/31/2020   CO2 22 01/31/2020   Lab Results  Component Value Date   ALT 49 (H) 01/31/2020   AST 24 01/31/2020   ALKPHOS 50  01/31/2020   BILITOT 0.5 01/31/2020   Lab Results  Component Value Date   HGBA1C 5.5 01/05/2020   HGBA1C 5.6 07/29/2019   Lab Results  Component Value Date   INSULIN 15.6 01/31/2020   INSULIN CANCELED 01/05/2020   Lab Results  Component Value Date   TSH 1.580 01/31/2020   Lab Results  Component Value Date   CHOL 191 01/31/2020   HDL 50 01/31/2020   LDLCALC 129 (H) 01/31/2020   TRIG 63 01/31/2020   CHOLHDL 4 07/28/2019   Lab Results  Component Value Date   WBC 3.9 01/31/2020   HGB 15.6 01/31/2020   HCT 45.9 01/31/2020   MCV 83 01/31/2020   PLT 204 01/31/2020   No results found for: IRON, TIBC, FERRITIN  Attestation Statements:   Reviewed by clinician on day of visit: allergies, medications, problem list, medical history, surgical history, family history, social history, and previous encounter notes.  Time spent on visit including pre-visit chart review and post-visit charting and care was 15 minutes.   I, Michaelene Song, am acting as transcriptionist for Coralie Common, MD   I have reviewed the above documentation for accuracy and completeness, and I agree with the above. - Jinny Blossom, MD

## 2020-03-20 ENCOUNTER — Ambulatory Visit (INDEPENDENT_AMBULATORY_CARE_PROVIDER_SITE_OTHER): Payer: BC Managed Care – PPO | Admitting: Adult Health

## 2020-03-20 ENCOUNTER — Encounter (INDEPENDENT_AMBULATORY_CARE_PROVIDER_SITE_OTHER): Payer: Self-pay | Admitting: Adult Health

## 2020-03-20 ENCOUNTER — Other Ambulatory Visit: Payer: Self-pay

## 2020-03-20 VITALS — BP 146/87 | HR 85 | Temp 98.1°F | Ht 75.0 in | Wt 374.0 lb

## 2020-03-20 DIAGNOSIS — I10 Essential (primary) hypertension: Secondary | ICD-10-CM

## 2020-03-20 DIAGNOSIS — Z9189 Other specified personal risk factors, not elsewhere classified: Secondary | ICD-10-CM

## 2020-03-20 DIAGNOSIS — E559 Vitamin D deficiency, unspecified: Secondary | ICD-10-CM | POA: Insufficient documentation

## 2020-03-20 DIAGNOSIS — Z6841 Body Mass Index (BMI) 40.0 and over, adult: Secondary | ICD-10-CM

## 2020-03-20 HISTORY — DX: Essential (primary) hypertension: I10

## 2020-03-20 HISTORY — DX: Vitamin D deficiency, unspecified: E55.9

## 2020-03-20 MED ORDER — VITAMIN D (ERGOCALCIFEROL) 1.25 MG (50000 UNIT) PO CAPS: 50000.0000 [IU] | ORAL_CAPSULE | ORAL | 0 refills | Status: DC

## 2020-03-20 NOTE — Progress Notes (Addendum)
Chief Complaint:   OBESITY Travis [name] is here to discuss his progress with his obesity treatment plan along with follow-up of his obesity related diagnoses. Travis [name] is on the Category 4 Plan and states he is following his eating plan approximately 60% of the time. Travis [name] states he is going to the gym for 60 minutes 2-3 times per week.  Today's visit was #: 5 Starting weight: 399 lbs Starting date: 01/05/2020 Today's weight: 374 lbs Today's date: 03/20/2020 Total lbs lost to date: 25 lbs Total lbs lost since last in-office visit: 2 lbs  Interim History: Travis [name] has been working over 60 hours per week due to a hectic work schedule.  He was unable to grocery shop over the last 1.5 weeks.  He feels that his energy level has increased.  He reports strenuous activity during work day, ie heavy lifting/pushing/pulling.  Subjective:   1. Vitamin D deficiency Travis [name]'s Vitamin D level was 22.8 on 01/31/2020. He is currently taking prescription vitamin D 50,000 IU each week. He denies nausea, vomiting or muscle weakness.  2. Essential hypertension Review:He denies chest pain on exertion, dyspnea on exertion, swelling of ankles.  Blood pressure borderline at 146/87.  Readings have been elevated at his last 3 office visits.  He is not on an antihypertensive medication currently.  He was on some sort of blood pressure medication in middle school for about 6 months.  He is unsure of the name/dosage.  He denies cardiac symptoms.      BP Readings from Last 3 Encounters:  03/20/20 (!) 146/87  03/02/20 140/88  02/15/20 140/80   3. At risk for heart disease Travis [name] is at a higher than average risk for cardiovascular disease due to obesity.   Assessment/Plan:   1. Vitamin D deficiency Low Vitamin D level contributes to fatigue and are associated with obesity, breast, and colon cancer. He agrees to continue to take prescription Vitamin D @50 ,000 IU every week and will follow-up for routine testing of  Vitamin D, at least 2-3 times per year to avoid over-replacement. - Vitamin D, Ergocalciferol, (DRISDOL) 1.25 MG (50000 UNIT) CAPS capsule; Take 1 capsule (50,000 Units total) by mouth every 7 (seven) days.  Dispense: 4 capsule; Refill: 0  2. Essential hypertension Travis [name] is working on healthy weight loss and exercise to improve blood pressure control. We will watch for signs of hypotension as he continues his lifestyle modifications. He denies lower extremity edema.  Will defer starting ACE inhibitor today.  If blood pressure is elevated at next office visit, will start lisinopril.  3. At risk for heart disease Travis [name] was given approximately 15 minutes of coronary artery disease prevention counseling today. He is 34 y.o. male and has risk factors for heart disease including obesity. We discussed intensive lifestyle modifications today with an emphasis on specific weight loss instructions and strategies.   Repetitive spaced learning was employed today to elicit superior memory formation and behavioral change.  4. Class 3 severe obesity with serious comorbidity and body mass index (BMI) of 45.0 to 49.9 in adult, unspecified obesity type [facility]) Travis [name] is currently in the action stage of change. As such, his goal is to continue with weight loss efforts. He has agreed to the Category 4 Plan and keeping a food journal and adhering to recommended goals of 350-500 calories and 30+ grams of protein at breakfast.  Exercise goals: As is.  Behavioral modification strategies: increasing lean protein intake, increasing water intake, meal planning and cooking strategies and keeping  healthy foods in the home.  Handout Provided:  Additional Breakfast Options  Travis [name] has agreed to follow-up with our clinic in 2 weeks. He was informed of the importance of frequent follow-up visits to maximize his success with intensive lifestyle modifications for his multiple health conditions.   Objective:   Blood pressure  (!) 146/87, pulse 85, temperature 98.1 F (36.7 C), temperature source Oral, height 6\' 3"  (1.905 m), weight (!) 374 lb (169.6 kg), SpO2 97 %. Body mass index is 46.75 kg/m.  General: Cooperative, alert, well developed, in no acute distress. HEENT: Conjunctivae and lids unremarkable. Cardiovascular: Regular rhythm.  Lungs: Normal work of breathing. Neurologic: No focal deficits.   Lab Results  Component Value Date   CREATININE 0.85 01/31/2020   BUN 9 01/31/2020   NA 138 01/31/2020   K 4.3 01/31/2020   CL 103 01/31/2020   CO2 22 01/31/2020   Lab Results  Component Value Date   ALT 49 (H) 01/31/2020   AST 24 01/31/2020   ALKPHOS 50 01/31/2020   BILITOT 0.5 01/31/2020   Lab Results  Component Value Date   HGBA1C 5.5 01/05/2020   HGBA1C 5.6 07/29/2019   Lab Results  Component Value Date   INSULIN 15.6 01/31/2020   INSULIN CANCELED 01/05/2020   Lab Results  Component Value Date   TSH 1.580 01/31/2020   Lab Results  Component Value Date   CHOL 191 01/31/2020   HDL 50 01/31/2020   LDLCALC 129 (H) 01/31/2020   TRIG 63 01/31/2020   CHOLHDL 4 07/28/2019   Lab Results  Component Value Date   WBC 3.9 01/31/2020   HGB 15.6 01/31/2020   HCT 45.9 01/31/2020   MCV 83 01/31/2020   PLT 204 01/31/2020   Attestation Statements:   Reviewed by clinician on day of visit: allergies, medications, problem list, medical history, surgical history, family history, social history, and previous encounter notes.  I, Water quality scientist, CMA, am acting as Location manager for Mina Marble, NP.  I have reviewed the above documentation for accuracy and completeness, and I agree with the above. -  Esaw Grandchild, NP

## 2020-04-03 ENCOUNTER — Encounter (INDEPENDENT_AMBULATORY_CARE_PROVIDER_SITE_OTHER): Payer: Self-pay | Admitting: Adult Health

## 2020-04-03 ENCOUNTER — Ambulatory Visit (INDEPENDENT_AMBULATORY_CARE_PROVIDER_SITE_OTHER): Payer: BC Managed Care – PPO | Admitting: Adult Health

## 2020-04-03 ENCOUNTER — Other Ambulatory Visit: Payer: Self-pay

## 2020-04-03 VITALS — BP 130/84 | HR 73 | Temp 98.5°F | Ht 75.0 in | Wt 369.0 lb

## 2020-04-03 DIAGNOSIS — Z6841 Body Mass Index (BMI) 40.0 and over, adult: Secondary | ICD-10-CM

## 2020-04-03 DIAGNOSIS — G4709 Other insomnia: Secondary | ICD-10-CM

## 2020-04-03 DIAGNOSIS — E559 Vitamin D deficiency, unspecified: Secondary | ICD-10-CM | POA: Diagnosis not present

## 2020-04-03 DIAGNOSIS — I10 Essential (primary) hypertension: Secondary | ICD-10-CM

## 2020-04-03 DIAGNOSIS — Z9189 Other specified personal risk factors, not elsewhere classified: Secondary | ICD-10-CM | POA: Diagnosis not present

## 2020-04-03 HISTORY — DX: Other insomnia: G47.09

## 2020-04-03 NOTE — Progress Notes (Signed)
Chief Complaint:   OBESITY Travis [name] is here to discuss his progress with his obesity treatment plan along with follow-up of his obesity related diagnoses. Travis [name] is on the Category 4 Plan and states he is following his eating plan approximately 70% of the time. Travis [name] states he is weightlifting/cardio 60-80 minutes 2-3 times per week.  Today's visit was #: 6 Starting weight: 399 lbs Starting date: 01/05/2020 Today's weight: 369 lbs Today's date: 04/03/2020 Total lbs lost to date: 30 Total lbs lost since last in-office visit: 5  Interim History: Travis [name] has been following the Category 4 meal plan consistently and denies polyphagia. He will choose higher protein snack options that will keep him fuller for longer periods of time. He will be traveling to [place] to celebrate his uncle's retirement from the [name] [employer]. He reports a reduction of chronic left hip pain the last few weeks- fantastic!  Subjective:   Vitamin D deficiency. Travis [name] is on prescription Vitamin D supplementation.    Ref. Range 01/31/2020 15:21  Vitamin D, 25-Hydroxy Latest Ref Range: 30.0 - 100.0 ng/mL 22.8 (L)   Essential hypertension. Blood pressure is much better at his office visit today. He continues to remain off anti-hypertension medication and denies cardiac symptoms. He denies tobacco/vape use.  BP Readings from Last 3 Encounters:  04/03/20 130/84  03/20/20 (!) 146/87  03/02/20 140/88   Lab Results  Component Value Date   CREATININE 0.85 01/31/2020   CREATININE CANCELED 01/05/2020   CREATININE 0.76 10/09/2019   Other insomnia. Travis [name] reports difficulty falling asleep and early waking for the last 5 years. He denies snoring loudly. His mother has obstructive sleep apnea.  He has never had a sleep study.  At risk for obstructive sleep apnea. Travis [name] is at increased risk for sleep apnea due to insomnia and obesity.   Assessment/Plan:   Vitamin D deficiency. Low Vitamin D level  contributes to fatigue and are associated with obesity, breast, and colon cancer. He agrees to continue to take prescription Vitamin D as directed (no refill needed today) and will follow-up for routine testing of Vitamin D in July.  Essential hypertension. Travis [name] is working on healthy weight loss and exercise to improve blood pressure control. We will watch for signs of hypotension as he continues his lifestyle modifications. He will continue to follow the Category 4 meal plan.  Other insomnia. The problem of recurrent insomnia was discussed. Orders and follow up as documented in patient record. Counseling: Intensive lifestyle modifications are the first line treatment for this issue. We discussed several lifestyle modifications today and he will continue to work on diet, exercise and weight loss efforts. Ambulatory referral to Sleep Studies and he will practice good sleep hygiene. Travis [name] will continue to follow the Category 4 meal plan.  Counseling  Limit or avoid alcohol, caffeinated beverages, and cigarettes, especially close to bedtime.   Do not eat a large meal or eat spicy foods right before bedtime. This can lead to digestive discomfort that can make it hard for you to sleep.  Keep a sleep diary to help you and your health care provider figure out what could be causing your insomnia.  . Make your bedroom a dark, comfortable place where it is easy to fall asleep. ? Put up shades or blackout curtains to block light from outside. ? Use a white noise machine to block noise. ? Keep the temperature cool. . Limit screen use before bedtime. This includes: ? Watching TV. ? Using  your smartphone, tablet, or computer. . Stick to a routine that includes going to bed and waking up at the same times every day and night. This can help you fall asleep faster. Consider making a quiet activity, such as reading, part of your nighttime routine. . Try to avoid taking naps during the day so that you sleep  better at night. . Get out of bed if you are still awake after 15 minutes of trying to sleep. Keep the lights down, but try reading or doing a quiet activity. When you feel sleepy, go back to bed.  At risk for obstructive sleep apnea. Travis [name] was given approximately 15 minutes of coronary artery disease prevention counseling today. He is 34 y.o. male and has risk factors for obstructive sleep apnea including obesity. We discussed intensive lifestyle modifications today with an emphasis on specific weight loss instructions and strategies.  Repetitive spaced learning was employed today to elicit superior memory formation and behavioral change.  Class 3 severe obesity with serious comorbidity and body mass index (BMI) of 45.0 to 49.9 in adult, unspecified obesity type (Travis [name]).  Travis [name] is currently in the action stage of change. As such, his goal is to continue with weight loss efforts. He has agreed to the Category 4 Plan.   Exercise goals: Travis [name] will continue his current exercise regimen.   Behavioral modification strategies: increasing lean protein intake, meal planning and cooking strategies, travel eating strategies, celebration eating strategies and planning for success.  Travis [name] has agreed to follow-up with our clinic in 2 weeks. He was informed of the importance of frequent follow-up visits to maximize his success with intensive lifestyle modifications for his multiple health conditions.   Objective:   Blood pressure 130/84, pulse 73, temperature 98.5 F (36.9 C), temperature source Oral, height 6\' 3"  (1.905 m), weight (!) 369 lb (167.4 kg), SpO2 99 %. Body mass index is 46.12 kg/m.  General: Cooperative, alert, well developed, in no acute distress. HEENT: Conjunctivae and lids unremarkable. Cardiovascular: Regular rhythm.  Lungs: Normal work of breathing. Neurologic: No focal deficits.   Lab Results  Component Value Date   CREATININE 0.85 01/31/2020   BUN 9 01/31/2020   NA 138  01/31/2020   K 4.3 01/31/2020   CL 103 01/31/2020   CO2 22 01/31/2020   Lab Results  Component Value Date   ALT 49 (H) 01/31/2020   AST 24 01/31/2020   ALKPHOS 50 01/31/2020   BILITOT 0.5 01/31/2020   Lab Results  Component Value Date   HGBA1C 5.5 01/05/2020   HGBA1C 5.6 07/29/2019   Lab Results  Component Value Date   INSULIN 15.6 01/31/2020   INSULIN CANCELED 01/05/2020   Lab Results  Component Value Date   TSH 1.580 01/31/2020   Lab Results  Component Value Date   CHOL 191 01/31/2020   HDL 50 01/31/2020   LDLCALC 129 (H) 01/31/2020   TRIG 63 01/31/2020   CHOLHDL 4 07/28/2019   Lab Results  Component Value Date   WBC 3.9 01/31/2020   HGB 15.6 01/31/2020   HCT 45.9 01/31/2020   MCV 83 01/31/2020   PLT 204 01/31/2020   No results found for: IRON, TIBC, FERRITIN  Attestation Statements:   Reviewed by clinician on day of visit: allergies, medications, problem list, medical history, surgical history, family history, social history, and previous encounter notes.  IMichaelene Song, am acting as Location manager for [employer], NP-C   I have reviewed the above documentation for accuracy and completeness, and  I agree with the above. -  Esaw Grandchild, NP

## 2020-04-19 ENCOUNTER — Ambulatory Visit (INDEPENDENT_AMBULATORY_CARE_PROVIDER_SITE_OTHER): Payer: BC Managed Care – PPO | Admitting: Family Medicine

## 2020-04-19 ENCOUNTER — Encounter (INDEPENDENT_AMBULATORY_CARE_PROVIDER_SITE_OTHER): Payer: Self-pay | Admitting: Family Medicine

## 2020-04-19 ENCOUNTER — Other Ambulatory Visit: Payer: Self-pay

## 2020-04-19 VITALS — BP 127/73 | HR 80 | Temp 98.4°F | Ht 75.0 in | Wt 371.0 lb

## 2020-04-19 DIAGNOSIS — Z9189 Other specified personal risk factors, not elsewhere classified: Secondary | ICD-10-CM | POA: Diagnosis not present

## 2020-04-19 DIAGNOSIS — Z6841 Body Mass Index (BMI) 40.0 and over, adult: Secondary | ICD-10-CM

## 2020-04-19 DIAGNOSIS — E559 Vitamin D deficiency, unspecified: Secondary | ICD-10-CM

## 2020-04-19 DIAGNOSIS — E8881 Metabolic syndrome: Secondary | ICD-10-CM

## 2020-04-19 MED ORDER — VITAMIN D (ERGOCALCIFEROL) 1.25 MG (50000 UNIT) PO CAPS: 50000.0000 [IU] | ORAL_CAPSULE | ORAL | 0 refills | Status: DC

## 2020-04-25 NOTE — Progress Notes (Signed)
Chief Complaint:   OBESITY Travis [name] is here to discuss his progress with his obesity treatment plan along with follow-up of his obesity related diagnoses. [name] is on the Category 4 Plan and states he is following his eating plan approximately 40% of the time. [name] states he is walking 3-4 miles and working in [place] 4 times per week.  Today's visit was #: 7 Starting weight: 399 lbs Starting date: 01/05/2020 Today's weight: 371 lbs Today's date: 04/19/2020 Total lbs lost to date: 28 Total lbs lost since last in-office visit: 0  Interim History: Travis [name]'s birthday is tomorrow but he has no plans currently. He has been out of town then he has been increasing work over the last few weeks. He voices after going out of town its always harder to get back on the plan. He has food at home.  Subjective:   1. Vitamin D deficiency [name] denies nausea, vomiting, or muscle weakness, but he notes fatigue. He is on prescription Vit D.  2. Insulin resistance [name]'s last A1c was 5.5 and insulin 15.6. He is not on medications.  3. At risk for osteoporosis [name] is at higher risk of osteopenia and osteoporosis due to Vitamin D deficiency.   Assessment/Plan:   1. Vitamin D deficiency Low Vitamin D level contributes to fatigue and are associated with obesity, breast, and colon cancer. We will refill prescription Vitamin D for 1 month. Elford will follow-up for routine testing of Vitamin D, at least 2-3 times per year to avoid over-replacement.  - Vitamin D, Ergocalciferol, (DRISDOL) 1.25 MG (50000 UNIT) CAPS capsule; Take 1 capsule (50,000 Units total) by mouth every 7 (seven) days.  Dispense: 4 capsule; Refill: 0  2. Insulin resistance Travis [name] will continue to work on weight loss, exercise, and decreasing simple carbohydrates to help decrease the risk of diabetes. We will follow up on labs in 2 months. Travis [name] agreed to follow-up with [name] as directed to closely monitor his  progress.  3. At risk for osteoporosis Travis [name] was given approximately 15 minutes of osteoporosis prevention counseling today. Travis [name] is at risk for osteopenia and osteoporosis due to his Vitamin D deficiency. He was encouraged to take his Vitamin D and follow his higher calcium diet and increase strengthening exercise to help strengthen his bones and decrease his risk of osteopenia and osteoporosis.  Repetitive spaced learning was employed today to elicit superior memory formation and behavioral change.  4. Class 3 severe obesity with serious comorbidity and body mass index (BMI) of 45.0 to 49.9 in adult, unspecified obesity type [facility]) Travis [name] is currently in the action stage of change. As such, his goal is to continue with weight loss efforts. He has agreed to the Category 4 Plan.   Exercise goals: As is.  Behavioral modification strategies: increasing lean protein intake, meal planning and cooking strategies and keeping healthy foods in the home.  Travis [name] has agreed to follow-up with our clinic in 2 to 3 weeks. He was informed of the importance of frequent follow-up visits to maximize his success with intensive lifestyle modifications for his multiple health conditions.   Objective:   Blood pressure 127/73, pulse 80, temperature 98.4 F (36.9 C), temperature source Oral, height 6\' 3"  (1.905 m), weight (!) 371 lb (168.3 kg), SpO2 95 %. Body mass index is 46.37 kg/m.  General: Cooperative, alert, well developed, in no acute distress. HEENT: Conjunctivae and lids unremarkable. Cardiovascular: Regular rhythm.  Lungs: Normal work of breathing. Neurologic: No focal deficits.   Lab  Results  Component Value Date   CREATININE 0.85 01/31/2020   BUN 9 01/31/2020   NA 138 01/31/2020   K 4.3 01/31/2020   CL 103 01/31/2020   CO2 22 01/31/2020   Lab Results  Component Value Date   ALT 49 (H) 01/31/2020   AST 24 01/31/2020   ALKPHOS 50 01/31/2020   BILITOT 0.5 01/31/2020   Lab  Results  Component Value Date   HGBA1C 5.5 01/05/2020   HGBA1C 5.6 07/29/2019   Lab Results  Component Value Date   INSULIN 15.6 01/31/2020   INSULIN CANCELED 01/05/2020   Lab Results  Component Value Date   TSH 1.580 01/31/2020   Lab Results  Component Value Date   CHOL 191 01/31/2020   HDL 50 01/31/2020   LDLCALC 129 (H) 01/31/2020   TRIG 63 01/31/2020   CHOLHDL 4 07/28/2019   Lab Results  Component Value Date   WBC 3.9 01/31/2020   HGB 15.6 01/31/2020   HCT 45.9 01/31/2020   MCV 83 01/31/2020   PLT 204 01/31/2020   No results found for: IRON, TIBC, FERRITIN  Attestation Statements:   Reviewed by clinician on day of visit: allergies, medications, problem list, medical history, surgical history, family history, social history, and previous encounter notes.   I, Trixie Dredge, am acting as transcriptionist for Coralie Common, MD.  I have reviewed the above documentation for accuracy and completeness, and I agree with the above. - Jinny Blossom, MD

## 2020-05-02 ENCOUNTER — Institutional Professional Consult (permissible substitution): Payer: BC Managed Care – PPO | Admitting: Neurology

## 2020-05-02 ENCOUNTER — Telehealth: Payer: Self-pay

## 2020-05-02 DIAGNOSIS — H40023 Open angle with borderline findings, high risk, bilateral: Secondary | ICD-10-CM | POA: Diagnosis not present

## 2020-05-02 NOTE — Telephone Encounter (Signed)
I contacted the pt and left a vm advising we would need to reschedule his 05/02/2020 appt due to provider not feeling well. Pt advised to call back and reschedule.

## 2020-05-04 ENCOUNTER — Emergency Department (HOSPITAL_COMMUNITY): Payer: BC Managed Care – PPO

## 2020-05-04 ENCOUNTER — Emergency Department (HOSPITAL_COMMUNITY)
Admission: EM | Admit: 2020-05-04 | Discharge: 2020-05-04 | Disposition: A | Discharge status: Home / Self Care | Payer: BC Managed Care – PPO | Attending: Emergency Medicine | Admitting: Emergency Medicine

## 2020-05-04 ENCOUNTER — Other Ambulatory Visit: Payer: Self-pay

## 2020-05-04 DIAGNOSIS — R079 Chest pain, unspecified: Secondary | ICD-10-CM | POA: Diagnosis not present

## 2020-05-04 DIAGNOSIS — J45909 Unspecified asthma, uncomplicated: Secondary | ICD-10-CM | POA: Diagnosis not present

## 2020-05-04 DIAGNOSIS — I10 Essential (primary) hypertension: Secondary | ICD-10-CM | POA: Diagnosis not present

## 2020-05-04 DIAGNOSIS — R000 Tachycardia, unspecified: Secondary | ICD-10-CM | POA: Diagnosis not present

## 2020-05-04 DIAGNOSIS — R0789 Other chest pain: Secondary | ICD-10-CM | POA: Diagnosis not present

## 2020-05-04 DIAGNOSIS — R0989 Other specified symptoms and signs involving the circulatory and respiratory systems: Secondary | ICD-10-CM | POA: Diagnosis not present

## 2020-05-04 LAB — BASIC METABOLIC PANEL
Anion gap: 10 (ref 5–15)
BUN: 12 mg/dL (ref 6–20)
CO2: 24 mmol/L (ref 22–32)
Calcium: 9.4 mg/dL (ref 8.9–10.3)
Chloride: 105 mmol/L (ref 98–111)
Creatinine, Ser: 0.77 mg/dL (ref 0.61–1.24)
GFR calc Af Amer: >60 mL/min (ref >60)
GFR calc non Af Amer: >60 mL/min (ref >60)
Glucose, Bld: 109 mg/dL — ABNORMAL HIGH (ref 70–99)
Potassium: 4.3 mmol/L (ref 3.5–5.1)
Sodium: 139 mmol/L (ref 135–145)

## 2020-05-04 LAB — CBC
HCT: 46 % (ref 39.0–52.0)
Hemoglobin: 15.4 g/dL (ref 13.0–17.0)
MCH: 28.2 pg (ref 26.0–34.0)
MCHC: 33.5 g/dL (ref 30.0–36.0)
MCV: 84.2 fL (ref 80.0–100.0)
Platelets: 210 10*3/uL (ref 150–400)
RBC: 5.46 MIL/uL (ref 4.22–5.81)
RDW: 12.6 % (ref 11.5–15.5)
WBC: 4.8 10*3/uL (ref 4.0–10.5)
nRBC: 0 % (ref 0.0–0.2)

## 2020-05-04 LAB — TROPONIN I (HIGH SENSITIVITY)
Troponin I (High Sensitivity): 6 ng/L (ref <18)
Troponin I (High Sensitivity): 9 ng/L (ref <18)

## 2020-05-04 LAB — CK: Total CK: 89 U/L (ref 49–397)

## 2020-05-04 MED ORDER — FAMOTIDINE IN NACL 20-0.9 MG/50ML-% IV SOLN
20.0000 mg | Freq: Once | INTRAVENOUS | Status: AC
  Administered 2020-05-04: 20 mg via INTRAVENOUS
  Filled 2020-05-04: qty 50

## 2020-05-04 MED ORDER — CYCLOBENZAPRINE HCL 10 MG PO TABS
10.0000 mg | ORAL_TABLET | Freq: Two times a day (BID) | ORAL | 0 refills | Status: DC | PRN
Start: 2020-05-04 — End: 2020-05-05

## 2020-05-04 MED ORDER — IBUPROFEN 800 MG PO TABS
800.0000 mg | ORAL_TABLET | Freq: Three times a day (TID) | ORAL | 0 refills | Status: DC
Start: 2020-05-04 — End: 2020-05-05

## 2020-05-04 MED ORDER — CYCLOBENZAPRINE HCL 10 MG PO TABS
10.0000 mg | ORAL_TABLET | Freq: Once | ORAL | Status: AC
  Administered 2020-05-04: 10 mg via ORAL
  Filled 2020-05-04: qty 1

## 2020-05-04 MED ORDER — KETOROLAC TROMETHAMINE 30 MG/ML IJ SOLN
30.0000 mg | Freq: Once | INTRAMUSCULAR | Status: AC
  Administered 2020-05-04: 30 mg via INTRAVENOUS
  Filled 2020-05-04: qty 1

## 2020-05-04 MED ORDER — SODIUM CHLORIDE 0.9 % IV BOLUS
1000.0000 mL | Freq: Once | INTRAVENOUS | Status: AC
  Administered 2020-05-04: 1000 mL via INTRAVENOUS

## 2020-05-04 MED ORDER — IBUPROFEN 800 MG PO TABS
800.0000 mg | ORAL_TABLET | Freq: Once | ORAL | Status: AC
  Administered 2020-05-04: 800 mg via ORAL
  Filled 2020-05-04: qty 1

## 2020-05-04 NOTE — ED Notes (Signed)
Patient verbalizes understanding of discharge instructions. Opportunity for questioning and answers were provided. Armband removed by staff, pt discharged from ED to home via private vehicle driven by family.

## 2020-05-04 NOTE — ED Notes (Addendum)
Patient reports worsening 7/10 chest pain. Repeat EKG completed and Dr Darl Householder notified.

## 2020-05-04 NOTE — ED Provider Notes (Signed)
[facility] Provider Note   CSN: [id number] Arrival date & time: 05/04/20  1442     History Chief Complaint  Patient presents with  . Chest Pain    KIPP SHANK is a 34 y.o. male hx of ADD, HTN, overweight, here presenting with left-sided chest pain. Patient states that he was moving a rebar and then had sudden onset of left-sided chest pain.  Patient states that it is a cramping sensation. Patient states that is worse with movement.  Patient has a history of chest wall pain previously.  Called EMS and apparently has some nonspecific ST changes so we will send here for evaluation. Patient has no history of previous heart attack in the past. Denies any recent travel.  The history is provided by the patient.       Past Medical History:  Diagnosis Date  . ADD (attention deficit disorder)   . Asthma    childhood  . Asthma   . Colon polyps   . Hip pain   . HTN (hypertension)   . Joint pain   . Overweight     Patient Active Problem List   Diagnosis Date Noted  . Other insomnia 04/03/2020  . Vitamin D deficiency 03/20/2020  . Essential hypertension 03/20/2020  . Class 3 severe obesity with serious comorbidity and body mass index (BMI) of 45.0 to 49.9 in adult [facility]) 07/28/2019  . Left hip pain 07/28/2019    Past Surgical History:  Procedure Laterality Date  . arm Right   . TONSILLECTOMY    . WISDOM TOOTH EXTRACTION         Family History  Problem Relation Age of Onset  . Obesity Mother   . Mental illness Mother   . Depression Mother   . Bipolar disorder Mother   . Sleep apnea Mother   . Alcoholism Mother   . Eating disorder Mother   . Irritable bowel syndrome Sister   . Hearing loss Maternal Grandmother     Social History   Tobacco Use  . Smoking status: Never Smoker  . Smokeless tobacco: Never Used  Substance Use Topics  . Alcohol use: Yes    Comment: on weekends  . Drug use: No    Home Medications Prior to  Admission medications   Medication Sig Start Date End Date Taking? Authorizing Provider  omeprazole (PRILOSEC) 20 MG capsule Take 20 mg by mouth daily.    [provider]  ondansetron (ZOFRAN-ODT) 4 MG disintegrating tablet Take 1 tablet (4 mg total) by mouth every 8 (eight) hours as needed for nausea or vomiting. 12/22/19   Shelda Pal, DO  Vitamin D, Ergocalciferol, (DRISDOL) 1.25 MG (50000 UNIT) CAPS capsule Take 1 capsule (50,000 Units total) by mouth every 7 (seven) days. 04/19/20   Eber Jones, MD    Allergies    Patient has no known allergies.  Review of Systems   Review of Systems  Cardiovascular: Positive for chest pain.  All other systems reviewed and are negative.   Physical Exam Updated Vital Signs BP (!) 151/85 (BP Location: Right Arm)   Pulse 88   Resp 18   SpO2 99%   Physical Exam Vitals and nursing note reviewed.  Constitutional:      Appearance: He is well-developed.  HENT:     Head: Normocephalic.  Eyes:     Extraocular Movements: Extraocular movements intact.     Pupils: Pupils are equal, round, and reactive to light.  Cardiovascular:  Rate and Rhythm: Normal rate and regular rhythm.     Heart sounds: Normal heart sounds.  Pulmonary:     Effort: Pulmonary effort is normal.     Breath sounds: Normal breath sounds.     Comments: Reproducible L chest wall tenderness  Abdominal:     General: Bowel sounds are normal.     Palpations: Abdomen is soft.  Musculoskeletal:        General: Normal range of motion.     Cervical back: Normal range of motion and neck supple.  Skin:    General: Skin is warm.     Capillary Refill: Capillary refill takes less than 2 seconds.  Neurological:     General: No focal deficit present.     Mental Status: He is alert and oriented to person, place, and time.  Psychiatric:        Mood and Affect: Mood normal.        Behavior: Behavior normal.     ED Results / Procedures / Treatments    Labs (all labs ordered are listed, but only abnormal results are displayed) Labs Reviewed  BASIC METABOLIC PANEL - Abnormal; Notable for the following components:      Result Value   Glucose, Bld 109 (*)    All other components within normal limits  CBC  CK  TROPONIN I (HIGH SENSITIVITY)  TROPONIN I (HIGH SENSITIVITY)    EKG EKG Interpretation  Date/Time:  Thursday May 04 2020 14:52:41 EDT Ventricular Rate:  92 PR Interval:  162 QRS Duration: 98 QT Interval:  350 QTC Calculation: 432 R Axis:   46 Text Interpretation: Normal sinus rhythm Normal ECG No significant change since last tracing Confirmed by Wandra Arthurs [phone]) on 05/04/2020 7:25:04 PM   Radiology DG Chest 2 View  Result Date: 05/04/2020 CLINICAL DATA:  Chest pain EXAM: CHEST - 2 VIEW COMPARISON:  Chest radiograph dated 10/09/2019 FINDINGS: The heart size and mediastinal contours are within normal limits. Both lungs are clear. The visualized skeletal structures are unremarkable. IMPRESSION: No active cardiopulmonary disease. Electronically Signed   By: Zerita Boers M.D.   On: 05/04/2020 15:30    Procedures Procedures (including critical care time)  Medications Ordered in ED Medications  sodium chloride 0.9 % bolus 1,000 mL (has no administration in time range)  sodium chloride 0.9 % bolus 1,000 mL (has no administration in time range)  ketorolac (TORADOL) 30 MG/ML injection 30 mg (has no administration in time range)  famotidine (PEPCID) IVPB 20 mg premix (has no administration in time range)    ED Course  I have reviewed the triage vital signs and the nursing notes.  Pertinent labs & imaging results that were available during my care of the patient were reviewed by me and considered in my medical decision making (see chart for details).    MDM Rules/Calculators/A&P                          ZAFIR SCHAUER is a 34 y.o. male here presenting with chest pain.  Patient does have chest wall tenderness.  I  think likely MSK in nature.  Patient's troponin is negative x2.  I doubt PE or dissection.  Patient CK level was normal.  Patient was given fluids and Toradol and Flexeril and felt better.  Will discharge home with Motrin and Flexeril.  Final Clinical Impression(s) / ED Diagnoses Final diagnoses:  None    Rx / DC Orders ED  Discharge Orders    None       Drenda Freeze, MD 05/04/20 2153

## 2020-05-04 NOTE — Discharge Instructions (Signed)
Take motrin for pain   Take flexeril for muscle spasms  See your doctor  Return to ER if you have worse chest pain, trouble breathing.

## 2020-05-04 NOTE — ED Triage Notes (Signed)
Pt called EMS for "heart problems." Pt very anxious on their arrival, Initial heart rate 165. Per EMS pt began having chest pain in route, shot multiple EKG's and revealed ST segment changes in route. 4 mg zofran and 324 ASA given.

## 2020-05-05 ENCOUNTER — Encounter: Payer: Self-pay | Admitting: Family Medicine

## 2020-05-05 ENCOUNTER — Ambulatory Visit (INDEPENDENT_AMBULATORY_CARE_PROVIDER_SITE_OTHER): Payer: BC Managed Care – PPO | Admitting: Family Medicine

## 2020-05-05 VITALS — BP 138/78 | HR 82 | Temp 98.3°F | Ht 76.0 in | Wt 374.2 lb

## 2020-05-05 DIAGNOSIS — R0789 Other chest pain: Secondary | ICD-10-CM

## 2020-05-05 DIAGNOSIS — R002 Palpitations: Secondary | ICD-10-CM | POA: Diagnosis not present

## 2020-05-05 NOTE — Patient Instructions (Signed)
If you do not hear anything about your referral in the next 1-2 weeks, call our office and ask for an update.  Mind caffeine intake.   Stay hydrated.  Let us know if you need anything.

## 2020-05-05 NOTE — Progress Notes (Signed)
Chief Complaint  Patient presents with  . Hospitalization Follow-up    Travis [name] is a 34 y.o. male here for palpitations.  Length of issue: 1 day How long does it last: a few seconds Light headedness/passing out? Yes; did not pass out Chest pain/shortness of breath? Yes; no sob; central and achy/dull pain centrally Hx of arrhythmia? No Medication changes/illicit substances? No No increased stress.  No caffeine or EtOH use.   Past Medical History:  Diagnosis Date  . ADD (attention deficit disorder)   . Asthma    childhood  . Asthma   . Colon polyps   . Hip pain   . HTN (hypertension)   . Joint pain   . Overweight    Past Surgical History:  Procedure Laterality Date  . arm Right   . TONSILLECTOMY    . WISDOM TOOTH EXTRACTION     No Known Allergies Allergies as of 05/05/2020   No Known Allergies     Medication List       Accurate as of May 05, 2020 11:57 AM. If you have any questions, ask your nurse or doctor.        STOP taking these medications   cyclobenzaprine 10 MG tablet Commonly known as: FLEXERIL Stopped by: Shelda Pal, DO   ibuprofen 800 MG tablet Commonly known as: ADVIL Stopped by: Shelda Pal, DO   ondansetron 4 MG disintegrating tablet Commonly known as: ZOFRAN-ODT Stopped by: Shelda Pal, DO     TAKE these medications   omeprazole 20 MG capsule Commonly known as: PRILOSEC Take 20 mg by mouth daily.   Vitamin D (Ergocalciferol) 1.25 MG (50000 UNIT) Caps capsule Commonly known as: DRISDOL Take 1 capsule (50,000 Units total) by mouth every 7 (seven) days.       BP (!) 138/78 (BP Location: Left Arm, Patient Position: Sitting, Cuff Size: Large)   Pulse 82   Temp 98.3 F (36.8 C) (Oral)   Ht 6\' 4"  (1.93 m)   Wt (!) 374 lb 4 oz (169.8 kg)   SpO2 97%   BMI 45.56 kg/m  Gen: Awake, alert, appearing stated age Eyes: PERRLA Mouth: MMM Heart: RRR, no bruits, no LE edema Lungs: CTAB, no accessory  muscle use Neuro: No cerebellar signs MSK: No ttp Psych: Age appropriate judgment and insight, mood/affect WNL  Palpitations - Plan: Ambulatory referral to Cardiology  Atypical chest pain - Plan: Ambulatory referral to Cardiology  EKG from ER is unremarkable. Refer cards for possible long term monitor.  F/u prn.  The patient voiced understanding and agreement to the plan.  Crosby Oyster Bailley [place] 11:57 AM 05/05/20

## 2020-05-10 ENCOUNTER — Ambulatory Visit (INDEPENDENT_AMBULATORY_CARE_PROVIDER_SITE_OTHER): Payer: BC Managed Care – PPO

## 2020-05-10 ENCOUNTER — Encounter: Payer: Self-pay | Admitting: Cardiology

## 2020-05-10 ENCOUNTER — Ambulatory Visit: Payer: BC Managed Care – PPO | Admitting: Cardiology

## 2020-05-10 ENCOUNTER — Other Ambulatory Visit: Payer: Self-pay

## 2020-05-10 VITALS — BP 134/90 | HR 70 | Ht 76.0 in | Wt 366.0 lb

## 2020-05-10 DIAGNOSIS — R42 Dizziness and giddiness: Secondary | ICD-10-CM | POA: Diagnosis not present

## 2020-05-10 DIAGNOSIS — R079 Chest pain, unspecified: Secondary | ICD-10-CM

## 2020-05-10 DIAGNOSIS — R002 Palpitations: Secondary | ICD-10-CM | POA: Diagnosis not present

## 2020-05-10 DIAGNOSIS — R0602 Shortness of breath: Secondary | ICD-10-CM

## 2020-05-10 NOTE — Progress Notes (Signed)
Cardiology Office Note:    Date:  05/10/2020   ID:  Travis [name], DOB 08/03/1986, MRN [id number]  PCP:  Shelda Pal, DO  Cardiologist:  Shirlee More, MD   Referring MD: Shelda Pal*  ASSESSMENT:    1. Palpitations   2. Chest pain of uncertain etiology   3. Shortness of breath    PLAN:    In order of problems listed above:  1. His presentation certainly is disproportionate to typical chest wall pain syndrome.  For further evaluation we will then apply a live monitor to see if he is having recurrent rapid rhythms and utilize it for 2 weeks. we will do the quick evaluation with shortness of breath and chest pain for cardiomyopathy echocardiogram and CAD stress myocardial perfusion study.  He will be seen back in the office in 2 weeks and follow-up after the testing is performed.  Next appointment 2 weeks   Medication Adjustments/Labs and Tests Ordered: Current medicines are reviewed at length with the patient today.  Concerns regarding medicines are outlined above.  Orders Placed This Encounter  Procedures  . MYOCARDIAL PERFUSION IMAGING  . LONG TERM MONITOR-LIVE TELEMETRY (3-14 DAYS)  . EKG 12-Lead  . ECHOCARDIOGRAM COMPLETE   No orders of the defined types were placed in this encounter.    Chief Complaint  Patient presents with  . Palpitations  . Chest Pain  . Shortness of Breath    History of Present Illness:    Travis [name] is a 34 y.o. male who is being seen today for the evaluation of chest pain after [name], ED evaluation 05/04/2020.  At the request of Shelda Pal*.  Cardiovascular risk factors include hypertension has had previous chest wall pain.  The high-sensitivity troponins were all normal and 2 EKGs in the emergency room were normal.  The x-ray was normal and other labs including CBC and BMP were normal except for mildly elevated random glucose of 106. He wants noted to have chest wall tenderness was diagnosed as  musculoskeletal pain and was treated with a nonsteroidal anti-inflammatory drug.  Has a background history of palpitation never severe sustained and previous soreness in his left chest.  The day presented to the emergency room and his opinion was entirely different he was unloading rebar from a truck with another man weight 100 -150 pounds and had the onset of very rapid heart rhythm it made him feel weak he had to stop rest and afterwards he still did not feel well he decided to go home was driving the car felt like he did lose consciousness arrived home he looked quite ill his wife called the ambulance.  He tells me his heart rate was 150 bpm when they first arrived and in transport they felt he had ST segment abnormality on his monitor.  He had chest pain until yesterday persist in the left chest little positional he describes as burning and does have left chest wall tenderness.  Today was the first time resolved coming in the office it recurred again is chest wall tenderness is EKG is normal.  He has lost his strength and endurance since this event he has exertional shortness of breath when doing more than usual activities and again does not feel well.  He is referred here for further evaluation.  He has no family history of CAD cardiomyopathy or sudden cardiac death he has had elevated blood pressure in the past but has not required medications since he has had supervised  weight loss and is not diabetic. Past Medical History:  Diagnosis Date  . ADD (attention deficit disorder)   . Asthma    childhood  . Asthma   . Colon polyps   . Hip pain   . HTN (hypertension)   . Joint pain   . Overweight     Past Surgical History:  Procedure Laterality Date  . arm Right   . TONSILLECTOMY    . WISDOM TOOTH EXTRACTION      Current Medications: Current Meds  Medication Sig  . ibuprofen (ADVIL) 200 MG tablet Take 200 mg by mouth as needed.  . Vitamin D, Ergocalciferol, (DRISDOL) 1.25 MG (50000 UNIT)  CAPS capsule Take 1 capsule (50,000 Units total) by mouth every 7 (seven) days.     Allergies:   Patient has no known allergies.   Social History   Socioeconomic History  . Marital status: Single    Spouse name: Not on file  . Number of children: Not on file  . Years of education: Not on file  . Highest education level: Not on file  Occupational History  . Not on file  Tobacco Use  . Smoking status: Never Smoker  . Smokeless tobacco: Never Used  Substance and Sexual Activity  . Alcohol use: Yes    Comment: on weekends  . Drug use: No  . Sexual activity: Not on file  Other Topics Concern  . Not on file  Social History Narrative  . Not on file   Social Determinants of Health   Financial Resource Strain:   . Difficulty of Paying Living Expenses:   Food Insecurity:   . Worried About Charity fundraiser in the Last Year:   . Arboriculturist in the Last Year:   Transportation Needs:   . Film/video editor (Medical):   Marland Kitchen Lack of Transportation (Non-Medical):   Physical Activity:   . Days of Exercise per Week:   . Minutes of Exercise per Session:   Stress:   . Feeling of Stress :   Social Connections:   . Frequency of Communication with Friends and Family:   . Frequency of Social Gatherings with Friends and Family:   . Attends Religious Services:   . Active Member of Clubs or Organizations:   . Attends Archivist Meetings:   Marland Kitchen Marital Status:      Family History: The patient's family history includes Alcoholism in his mother; Bipolar disorder in his mother; Depression in his mother; Eating disorder in his mother; Hearing loss in his maternal grandmother; Irritable bowel syndrome in his sister; Mental illness in his mother; Obesity in his mother; Sleep apnea in his mother.  ROS:   Review of Systems  Constitutional: Positive for malaise/fatigue.  HENT: Negative.   Eyes: Negative.   Cardiovascular: Positive for chest pain, dyspnea on exertion and  palpitations.  Respiratory: Positive for shortness of breath.   Endocrine: Negative.   Hematologic/Lymphatic: Negative.   Skin: Negative.   Musculoskeletal: Negative.   Gastrointestinal: Negative.   Genitourinary: Negative.   Neurological: Negative.   Psychiatric/Behavioral: Negative.   Allergic/Immunologic: Negative.    Please see the history of present illness.     All other systems reviewed and are negative.  EKGs/Labs/Other Studies Reviewed:    The following studies were reviewed today:   EKG:  EKG is repeated ordered today.  The ekg ordered today is personally reviewed and demonstrates sinus rhythm normal EKG  Recent Labs: 01/31/2020: ALT 49; TSH 1.580  05/04/2020: BUN 12; Creatinine, Ser 0.77; Hemoglobin 15.4; Platelets 210; Potassium 4.3; Sodium 139  Recent Lipid Panel    Component Value Date/Time   CHOL 191 01/31/2020 1521   TRIG 63 01/31/2020 1521   HDL 50 01/31/2020 1521   CHOLHDL 4 07/28/2019 1012   VLDL 11.8 07/28/2019 1012   LDLCALC 129 (H) 01/31/2020 1521    Physical Exam:    VS:  BP 134/90 (BP Location: Right Arm, Patient Position: Sitting, Cuff Size: Large)   Pulse 70   Ht 6\' 4"  (1.93 m)   Wt (!) 366 lb (166 kg)   SpO2 96%   BMI 44.55 kg/m     Wt Readings from Last 3 Encounters:  05/10/20 (!) 366 lb (166 kg)  05/05/20 (!) 374 lb 4 oz (169.8 kg)  04/19/20 (!) 371 lb (168.3 kg)     GEN: Obese BMI approaches 45 well nourished, well developed in no acute distress HEENT: Normal NECK: No JVD; No carotid bruits LYMPHATICS: No lymphadenopathy CARDIAC: Mild left costochondral junction tenderness RRR, no murmurs, rubs, gallops RESPIRATORY:  Clear to auscultation without rales, wheezing or rhonchi  ABDOMEN: Soft, non-tender, non-distended MUSCULOSKELETAL:  No edema; No deformity  SKIN: Warm and dry NEUROLOGIC:  Alert and oriented x 3 PSYCHIATRIC:  Normal affect     Signed, Shirlee More, MD  05/10/2020 3:40 PM    [facility]

## 2020-05-10 NOTE — Patient Instructions (Addendum)
Medication Instructions:  Your physician recommends that you continue on your current medications as directed. Please refer to the Current Medication list given to you today.  *If you need a refill on your cardiac medications before your next appointment, please call your pharmacy*   Lab Work: None If you have labs (blood work) drawn today and your tests are completely normal, you will receive your results only by: Marland Kitchen MyChart Message (if you have MyChart) OR . A paper copy in the mail If you have any lab test that is abnormal or we need to change your treatment, we will call you to review the results.   Testing/Procedures: Your physician has requested that you have an echocardiogram. Echocardiography is a painless test that uses sound waves to create images of your heart. It provides your doctor with information about the size and shape of your heart and how well your heart's chambers and valves are working. This procedure takes approximately one hour. There are no restrictions for this procedure.  A zio monitor was ordered today. It will remain on for 14 days. You will then return monitor and event diary in provided box. It takes 1-2 weeks for report to be downloaded and returned to [name]. We will call you with the results. If monitor falls off or has orange flashing light, please call Zio for further instructions.     [facility] [address] [place], [place] [address] Phone:  [phone]    Please arrive 15 minutes prior to your appointment time for registration and insurance purposes.  The test will take approximately 3 to 4 hours to complete; you may bring reading material.  If someone comes with you to your appointment, they will need to remain in the main lobby due to limited space in the testing area. **If you are pregnant or breastfeeding, please notify the nuclear lab prior to your appointment**  How to prepare for your Myocardial Perfusion  Test: . Do not eat or drink 3 hours prior to your test, except you may have water. . Do not consume products containing caffeine (regular or decaffeinated) 12 hours prior to your test. (ex: coffee, chocolate, sodas, tea). . Do bring a list of your current medications with you.  If not listed below, you may take your medications as normal. . Do wear comfortable clothes (no dresses or overalls) and walking shoes, tennis shoes preferred (No heels or open toe shoes are allowed). . Do NOT wear cologne, perfume, aftershave, or lotions (deodorant is allowed). . If these instructions are not followed, your test will have to be rescheduled.  Please report to [address] for your test.  If you have questions or concerns about your appointment, you can call the [facility] at [phone].  If you cannot keep your appointment, please provide 24 hours notification to the Nuclear Lab, to avoid a possible $50 charge to your account.    Follow-Up: At [facility], you and your health needs are our priority.  As part of our continuing mission to provide you with exceptional heart care, we have created designated Provider Care Teams.  These Care Teams include your primary Cardiologist (physician) and Advanced Practice Providers (APPs -  Physician Assistants and Nurse Practitioners) who all work together to provide you with the care you need, when you need it.  We recommend signing up for the patient portal called "MyChart".  Sign up information is provided on this After Visit Summary.  MyChart is used to connect with patients for Virtual Visits (Telemedicine).  Patients are able to view lab/test results, encounter notes, upcoming appointments, etc.  Non-urgent messages can be sent to your provider as well.   To learn more about what you can do with MyChart, go to [email or web address].    Your next appointment:   3 week(s)  The format for your next appointment:    In Person  Provider:   Shirlee More, MD or Berniece Salines   Other Instructions

## 2020-05-11 ENCOUNTER — Ambulatory Visit (INDEPENDENT_AMBULATORY_CARE_PROVIDER_SITE_OTHER): Payer: BC Managed Care – PPO | Admitting: Family Medicine

## 2020-05-11 ENCOUNTER — Encounter (HOSPITAL_COMMUNITY): Payer: Self-pay | Admitting: *Deleted

## 2020-05-11 ENCOUNTER — Ambulatory Visit (HOSPITAL_BASED_OUTPATIENT_CLINIC_OR_DEPARTMENT_OTHER)
Admission: RE | Admit: 2020-05-11 | Discharge: 2020-05-11 | Disposition: A | Discharge status: Home / Self Care | Payer: BC Managed Care – PPO | Source: Ambulatory Visit | Attending: Cardiology | Admitting: Cardiology

## 2020-05-11 ENCOUNTER — Telehealth (HOSPITAL_COMMUNITY): Payer: Self-pay | Admitting: *Deleted

## 2020-05-11 ENCOUNTER — Other Ambulatory Visit: Payer: Self-pay

## 2020-05-11 ENCOUNTER — Encounter (INDEPENDENT_AMBULATORY_CARE_PROVIDER_SITE_OTHER): Payer: Self-pay | Admitting: Family Medicine

## 2020-05-11 VITALS — BP 144/67 | HR 78 | Temp 97.8°F | Ht 75.0 in | Wt 361.0 lb

## 2020-05-11 DIAGNOSIS — Z9189 Other specified personal risk factors, not elsewhere classified: Secondary | ICD-10-CM | POA: Diagnosis not present

## 2020-05-11 DIAGNOSIS — R079 Chest pain, unspecified: Secondary | ICD-10-CM

## 2020-05-11 DIAGNOSIS — E66813 Obesity, class 3: Secondary | ICD-10-CM

## 2020-05-11 DIAGNOSIS — E559 Vitamin D deficiency, unspecified: Secondary | ICD-10-CM | POA: Diagnosis not present

## 2020-05-11 DIAGNOSIS — Z6841 Body Mass Index (BMI) 40.0 and over, adult: Secondary | ICD-10-CM

## 2020-05-11 DIAGNOSIS — I10 Essential (primary) hypertension: Secondary | ICD-10-CM | POA: Diagnosis not present

## 2020-05-11 DIAGNOSIS — R0602 Shortness of breath: Secondary | ICD-10-CM

## 2020-05-11 LAB — ECHOCARDIOGRAM COMPLETE
Area-P 1/2: 4.39 cm2
S' Lateral: 3.32 cm
Single Plane A4C EF: 66.6 %

## 2020-05-11 MED ORDER — VITAMIN D (ERGOCALCIFEROL) 1.25 MG (50000 UNIT) PO CAPS: 50000.0000 [IU] | ORAL_CAPSULE | ORAL | 0 refills | Status: DC

## 2020-05-11 NOTE — Telephone Encounter (Signed)
Letter sent via My Chart with instructions for upcoming stress test on 05/16/20 at 1:15.

## 2020-05-11 NOTE — Progress Notes (Signed)
  Echocardiogram 2D Echocardiogram has been performed.  [name] F 05/11/2020, 10:58 AM

## 2020-05-12 ENCOUNTER — Ambulatory Visit: Payer: BC Managed Care – PPO | Admitting: Cardiology

## 2020-05-15 NOTE — Progress Notes (Signed)
Chief Complaint:   OBESITY Travis [name] is here to discuss his progress with his obesity treatment plan along with follow-up of his obesity related diagnoses. Travis [name] is on the Category 4 Plan and states he is following his eating plan approximately 78% of the time. Travis [name] states he is bike riding and weights for 60 minutes 1-2 times per week.  Today's visit was #: 8 Starting weight: 399 lbs Starting date: 01/05/2020 Today's weight: 364 lbs Today's date: 05/11/2020 Total lbs lost to date: 35 Total lbs lost since last in-office visit: 7  Interim History: Travis [name] has had a medically filled last few weeks. He voices that he had to go to the emergency department for chest pain and palpitations. He was on the plan almost 100% this past week secondary to not exercising.  Subjective:   1. Vitamin D deficiency Travis [name] denies nausea, vomiting, or muscle weakness, but he notes fatigue. He is on prescription Vit D.  2. Essential hypertension Travis [name] has been checking his blood pressure at home with a home monitor. His blood pressure has been labile.  3. At risk for osteoporosis Travis [name] is at higher risk of osteopenia and osteoporosis due to Vitamin D deficiency.   Assessment/Plan:   1. Vitamin D deficiency Low Vitamin D level contributes to fatigue and are associated with obesity, breast, and colon cancer. We will refill prescription Vitamin D for 1 month. Travis [name] will follow-up for routine testing of Vitamin D, at least 2-3 times per year to avoid over-replacement.  - Vitamin D, Ergocalciferol, (DRISDOL) 1.25 MG (50000 UNIT) CAPS capsule; Take 1 capsule (50,000 Units total) by mouth every 7 (seven) days.  Dispense: 4 capsule; Refill: 0  2. Essential hypertension Travis [name] is working on healthy weight loss and exercise to improve blood pressure control. We will watch for signs of hypotension as he continues his lifestyle modifications. We will recheck his blood pressure at his next  appointment.  3. At risk for osteoporosis Travis [name] was given approximately 15 minutes of osteoporosis prevention counseling today. Travis [name] is at risk for osteopenia and osteoporosis due to his Vitamin D deficiency. He was encouraged to take his Vitamin D and follow his higher calcium diet and increase strengthening exercise to help strengthen his bones and decrease his risk of osteopenia and osteoporosis.  Repetitive spaced learning was employed today to elicit superior memory formation and behavioral change.  4. Class 3 severe obesity with serious comorbidity and body mass index (BMI) of 45.0 to 49.9 in adult, unspecified obesity type [facility]) Travis [name] is currently in the action stage of change. As such, his goal is to continue with weight loss efforts. He has agreed to the Category 4 Plan.   Exercise goals: No exercise has been prescribed at this time.  Behavioral modification strategies: increasing lean protein intake, increasing vegetables, meal planning and cooking strategies, keeping healthy foods in the home and planning for success.  Travis [name] has agreed to follow-up with our clinic in 2 to 3 weeks. He was informed of the importance of frequent follow-up visits to maximize his success with intensive lifestyle modifications for his multiple health conditions.   Objective:   Blood pressure (!) 144/67, pulse 78, temperature 97.8 F (36.6 C), temperature source Oral, height 6\' 3"  (1.905 m), weight (!) 361 lb (163.7 kg), SpO2 96 %. Body mass index is 45.12 kg/m.  General: Cooperative, alert, well developed, in no acute distress. HEENT: Conjunctivae and lids unremarkable. Cardiovascular: Regular rhythm.  Lungs: Normal work of breathing. Neurologic: No focal  deficits.   Lab Results  Component Value Date   CREATININE 0.77 05/04/2020   BUN 12 05/04/2020   NA 139 05/04/2020   K 4.3 05/04/2020   CL 105 05/04/2020   CO2 24 05/04/2020   Lab Results  Component Value Date   ALT 49 (H)  01/31/2020   AST 24 01/31/2020   ALKPHOS 50 01/31/2020   BILITOT 0.5 01/31/2020   Lab Results  Component Value Date   HGBA1C 5.5 01/05/2020   HGBA1C 5.6 07/29/2019   Lab Results  Component Value Date   INSULIN 15.6 01/31/2020   INSULIN CANCELED 01/05/2020   Lab Results  Component Value Date   TSH 1.580 01/31/2020   Lab Results  Component Value Date   CHOL 191 01/31/2020   HDL 50 01/31/2020   LDLCALC 129 (H) 01/31/2020   TRIG 63 01/31/2020   CHOLHDL 4 07/28/2019   Lab Results  Component Value Date   WBC 4.8 05/04/2020   HGB 15.4 05/04/2020   HCT 46.0 05/04/2020   MCV 84.2 05/04/2020   PLT 210 05/04/2020   No results found for: IRON, TIBC, FERRITIN  Attestation Statements:   Reviewed by clinician on day of visit: allergies, medications, problem list, medical history, surgical history, family history, social history, and previous encounter notes.   I, Trixie Dredge, am acting as transcriptionist for Coralie Common, MD.  I have reviewed the above documentation for accuracy and completeness, and I agree with the above. - Jinny Blossom, MD

## 2020-05-16 ENCOUNTER — Other Ambulatory Visit (HOSPITAL_COMMUNITY): Payer: BC Managed Care – PPO

## 2020-05-16 ENCOUNTER — Ambulatory Visit (HOSPITAL_COMMUNITY): Payer: BC Managed Care – PPO | Attending: Cardiology

## 2020-05-16 ENCOUNTER — Other Ambulatory Visit: Payer: Self-pay

## 2020-05-16 DIAGNOSIS — R079 Chest pain, unspecified: Secondary | ICD-10-CM | POA: Diagnosis not present

## 2020-05-16 DIAGNOSIS — R0602 Shortness of breath: Secondary | ICD-10-CM | POA: Insufficient documentation

## 2020-05-16 MED ORDER — REGADENOSON 0.4 MG/5ML IV SOLN
0.4000 mg | Freq: Once | INTRAVENOUS | Status: AC
  Administered 2020-05-16: 0.4 mg via INTRAVENOUS

## 2020-05-16 MED ORDER — TECHNETIUM TC 99M TETROFOSMIN IV KIT
32.2000 | PACK | Freq: Once | INTRAVENOUS | Status: AC | PRN
  Administered 2020-05-16: 32.2 via INTRAVENOUS
  Filled 2020-05-16: qty 33

## 2020-05-17 ENCOUNTER — Ambulatory Visit (HOSPITAL_COMMUNITY): Payer: BC Managed Care – PPO | Attending: Cardiovascular Disease

## 2020-05-17 ENCOUNTER — Encounter: Payer: Self-pay | Admitting: Neurology

## 2020-05-17 ENCOUNTER — Ambulatory Visit (INDEPENDENT_AMBULATORY_CARE_PROVIDER_SITE_OTHER): Payer: BC Managed Care – PPO | Admitting: Neurology

## 2020-05-17 VITALS — BP 138/82 | HR 74 | Ht 76.0 in | Wt 372.0 lb

## 2020-05-17 DIAGNOSIS — R0683 Snoring: Secondary | ICD-10-CM

## 2020-05-17 DIAGNOSIS — R079 Chest pain, unspecified: Secondary | ICD-10-CM

## 2020-05-17 DIAGNOSIS — R002 Palpitations: Secondary | ICD-10-CM

## 2020-05-17 DIAGNOSIS — R55 Syncope and collapse: Secondary | ICD-10-CM

## 2020-05-17 DIAGNOSIS — G2581 Restless legs syndrome: Secondary | ICD-10-CM

## 2020-05-17 HISTORY — DX: Restless legs syndrome: G25.81

## 2020-05-17 HISTORY — DX: Snoring: R06.83

## 2020-05-17 HISTORY — DX: Morbid (severe) obesity due to excess calories: E66.01

## 2020-05-17 HISTORY — DX: Palpitations: R00.2

## 2020-05-17 HISTORY — DX: Chest pain, unspecified: R07.9

## 2020-05-17 HISTORY — DX: Syncope and collapse: R55

## 2020-05-17 LAB — MYOCARDIAL PERFUSION IMAGING
LV dias vol: 132 mL (ref 62–150)
LV sys vol: 58 mL
Peak HR: 125 {beats}/min
Rest HR: 83 {beats}/min
SDS: 1
SRS: 0
SSS: 1
TID: 0.87

## 2020-05-17 MED ORDER — TECHNETIUM TC 99M TETROFOSMIN IV KIT
31.2000 | PACK | Freq: Once | INTRAVENOUS | Status: AC | PRN
  Administered 2020-05-17: 31.2 via INTRAVENOUS
  Filled 2020-05-17: qty 32

## 2020-05-17 NOTE — Patient Instructions (Signed)
Restless Legs Syndrome Restless legs syndrome is a condition that causes uncomfortable feelings or sensations in the legs, especially while sitting or lying down. The sensations usually cause an overwhelming urge to move the legs. The arms can also sometimes be affected. The condition can range from mild to severe. The symptoms often interfere with a person's ability to sleep. What are the causes? The cause of this condition is not known. What increases the risk? The following factors may make you more likely to develop this condition:  Being older than 50.  Pregnancy.  Being a woman. In general, the condition is more common in women than in men.  A family history of the condition.  Having iron deficiency.  Overuse of caffeine, nicotine, or alcohol.  Certain medical conditions, such as kidney disease, Parkinson's disease, or nerve damage.  Certain medicines, such as those for high blood pressure, nausea, colds, allergies, depression, and some heart conditions. What are the signs or symptoms? The main symptom of this condition is uncomfortable sensations in the legs, such as:  Pulling.  Tingling.  Prickling.  Throbbing.  Crawling.  Burning. Usually, the sensations:  Affect both sides of the body.  Are worse when you sit or lie down.  Are worse at night. These may wake you up or make it difficult to fall asleep.  Make you have a strong urge to move your legs.  Are temporarily relieved by moving your legs. The arms can also be affected, but this is rare. People who have this condition often have tiredness during the day because of their lack of sleep at night. How is this diagnosed? This condition may be diagnosed based on:  Your symptoms.  Blood tests. In some cases, you may be monitored in a sleep lab by a specialist (a sleep study). This can detect any disruptions in your sleep. How is this treated? This condition is treated by managing the symptoms. This may  include:  Lifestyle changes, such as exercising, using relaxation techniques, and avoiding caffeine, alcohol, or tobacco.  Medicines. Anti-seizure medicines may be tried first. Follow these instructions at home:     General instructions  Take over-the-counter and prescription medicines only as told by your health care provider.  Use methods to help relieve the uncomfortable sensations, such as: ? Massaging your legs. ? Walking or stretching. ? Taking a cold or hot bath.  Keep all follow-up visits as told by your health care provider. This is important. Lifestyle  Practice good sleep habits. For example, go to bed and get up at the same time every day. Most adults should get 7-9 hours of sleep each night.  Exercise regularly. Try to get at least 30 minutes of exercise most days of the week.  Practice ways of relaxing, such as yoga or meditation.  Avoid caffeine and alcohol.  Do not use any products that contain nicotine or tobacco, such as cigarettes and e-cigarettes. If you need help quitting, ask your health care provider. Contact a health care provider if:  Your symptoms get worse or they do not improve with treatment. Summary  Restless legs syndrome is a condition that causes uncomfortable feelings or sensations in the legs, especially while sitting or lying down.  The symptoms often interfere with a person's ability to sleep.  This condition is treated by managing the symptoms. You may need to make lifestyle changes or take medicines. This information is not intended to replace advice given to you by your health care provider. Make sure   you discuss any questions you have with your health care provider. Document Revised: 10/13/2017 Document Reviewed: 10/13/2017 Elsevier Patient Education  [place]. Sleep Apnea Sleep apnea is a condition in which breathing pauses or becomes shallow during sleep. Episodes of sleep apnea usually last 10 seconds or longer, and  they may occur as many as 20 times an hour. Sleep apnea disrupts your sleep and keeps your body from getting the rest that it needs. This condition can increase your risk of certain health problems, including:  Heart attack.  Stroke.  Obesity.  Diabetes.  Heart failure.  Irregular heartbeat. What are the causes? There are three kinds of sleep apnea:  Obstructive sleep apnea. This kind is caused by a blocked or collapsed airway.  Central sleep apnea. This kind happens when the part of the brain that controls breathing does not send the correct signals to the muscles that control breathing.  Mixed sleep apnea. This is a combination of obstructive and central sleep apnea. The most common cause of this condition is a collapsed or blocked airway. An airway can collapse or become blocked if:  Your throat muscles are abnormally relaxed.  Your tongue and tonsils are larger than normal.  You are overweight.  Your airway is smaller than normal. What increases the risk? You are more likely to develop this condition if you:  Are overweight.  Smoke.  Have a smaller than normal airway.  Are elderly.  Are male.  Drink alcohol.  Take sedatives or tranquilizers.  Have a family history of sleep apnea. What are the signs or symptoms? Symptoms of this condition include:  Trouble staying asleep.  Daytime sleepiness and tiredness.  Irritability.  Loud snoring.  Morning headaches.  Trouble concentrating.  Forgetfulness.  Decreased interest in sex.  Unexplained sleepiness.  Mood swings.  Personality changes.  Feelings of depression.  Waking up often during the night to urinate.  Dry mouth.  Sore throat. How is this diagnosed? This condition may be diagnosed with:  A medical history.  A physical exam.  A series of tests that are done while you are sleeping (sleep study). These tests are usually done in a sleep lab, but they may also be done at home. How  is this treated? Treatment for this condition aims to restore normal breathing and to ease symptoms during sleep. It may involve managing health issues that can affect breathing, such as high blood pressure or obesity. Treatment may include:  Sleeping on your side.  Using a decongestant if you have nasal congestion.  Avoiding the use of depressants, including alcohol, sedatives, and narcotics.  Losing weight if you are overweight.  Making changes to your diet.  Quitting smoking.  Using a device to open your airway while you sleep, such as: ? An oral appliance. This is a custom-made mouthpiece that shifts your lower jaw forward. ? A continuous positive airway pressure (CPAP) device. This device blows air through a mask when you breathe out (exhale). ? A nasal expiratory positive airway pressure (EPAP) device. This device has valves that you put into each nostril. ? A bi-level positive airway pressure (BPAP) device. This device blows air through a mask when you breathe in (inhale) and breathe out (exhale).  Having surgery if other treatments do not work. During surgery, excess tissue is removed to create a wider airway. It is important to get treatment for sleep apnea. Without treatment, this condition can lead to:  High blood pressure.  Coronary artery disease.  In  men, an inability to achieve or maintain an erection (impotence).  Reduced thinking abilities. Follow these instructions at home: Lifestyle  Make any lifestyle changes that your health care provider recommends.  Eat a healthy, well-balanced diet.  Take steps to lose weight if you are overweight.  Avoid using depressants, including alcohol, sedatives, and narcotics.  Do not use any products that contain nicotine or tobacco, such as cigarettes, e-cigarettes, and chewing tobacco. If you need help quitting, ask your health care provider. General instructions  Take over-the-counter and prescription medicines only as  told by your health care provider.  If you were given a device to open your airway while you sleep, use it only as told by your health care provider.  If you are having surgery, make sure to tell your health care provider you have sleep apnea. You may need to bring your device with you.  Keep all follow-up visits as told by your health care provider. This is important. Contact a health care provider if:  The device that you received to open your airway during sleep is uncomfortable or does not seem to be working.  Your symptoms do not improve.  Your symptoms get worse. Get help right away if:  You develop: ? Chest pain. ? Shortness of breath. ? Discomfort in your back, arms, or stomach.  You have: ? Trouble speaking. ? Weakness on one side of your body. ? Drooping in your face. These symptoms may represent a serious problem that is an emergency. Do not wait to see if the symptoms will go away. Get medical help right away. Call your local emergency services (911 in the [place] Do not drive yourself to the hospital. Summary  Sleep apnea is a condition in which breathing pauses or becomes shallow during sleep.  The most common cause is a collapsed or blocked airway.  The goal of treatment is to restore normal breathing and to ease symptoms during sleep. This information is not intended to replace advice given to you by your health care provider. Make sure you discuss any questions you have with your health care provider. Document Revised: 03/10/2019 Document Reviewed: 05/19/2018 Elsevier Patient Education  [place].

## 2020-05-17 NOTE — Progress Notes (Signed)
SLEEP MEDICINE CLINIC    Provider:  Larey Seat, MD  Primary Care Physician:  Shelda Pal, [address] [place] [address]     Referring Provider:  Ileene Rubens [address],  [place] [address]          Chief Complaint according to patient   Patient presents with:    . New Patient (Initial Visit)     referred here for OSA rule out. states that he has never had a SS      HISTORY OF PRESENT ILLNESS:  Travis [name] is a 34  Year- old 89  male patient seen here as a referral on 05/17/2020 from Benay Pillow, NP  Chief concern according to patient :  I am needing help to loose weight, I had also heart flutter, cardiologist placed me on heart monitor, echo 05-11-2020, and myo-view just yesterday" . I lost already about 40 pounds since March. He had chest pain last Saturday.     I have the pleasure of seeing Travis [name] today, a right -handed biracial  male with a possible sleep disorder. He  has a past medical history of ADD (attention deficit disorder), Asthma, Asthma, Colon polyps, Hip pain, HTN (hypertension), Joint pain, and Overweight. had Covid in 2020 and has been vaccinated since.     Sleep relevant medical history: he had a Tonsillectomy in college , frequent otitis, rare GERD.   Family medical /sleep history:  Mother on CPAP with OSA.  Social history: Grew up in [place], grandfather taught at [employer] for 40 years- he graduated  of [employer],  he owns a Copywriter, advertising.  Patient is working independently. he lives in a household with a girlfriend . Pets are present , a dog.. Tobacco use: none .  ETOH use ; socially- rarely on week day , Caffeine intake in form of Coffee(  /) Soda( /) Tea ( /) or energy drinks. Regular exercise in form of Gym exercise 2-4 times a week, cardio and weight.     Sleep habits are as follows: The patient's dinner time is between 7.30-8.30 PM. The patient goes  to bed at 10.30 PM and struggles to fall asleep- most nights it takes 45 minutes to 90 minutes (!)>  Girlfriend snores.  He has been sleeping better- just last 14 days.   He continues to sleep for 2-3 hours, wakes for unknown reasons-, cyclic , month of good sleep, month of poorer sleep.  The preferred sleep position is prone, with the support of 1 pillow.  Dreams are reportedly frequent.  6-7 AM is the usual rise time. The patient wakes up spontaneously.  He reports mostly feeling refreshed / restored in AM- but there is a cyclic pattern here too. He  has a dry mouth- but morning headaches and residual fatigue.  Sometimes legs are sore. Sometimes he needs to massage his legs.  Naps are taken a rarely - causing him to have poor sleep the night after.    Review of Systems: Out of a complete 14 system review, the patient complains of only the following symptoms, and all other reviewed systems are negative.:   irrestsiable urge to move- RLS. PLMD?  Near syncope.  Fatigue, sleepiness ,  snoring, fragmented sleep, Insomnia - cyclic    How likely are you to doze in the following situations: 0 = not likely, 1 = slight chance, 2 =  moderate chance, 3 = high chance   Sitting and Reading? Watching Television? Sitting inactive in a public place (theater or meeting)? As a passenger in a car for an hour without a break? Lying down in the afternoon when circumstances permit? Sitting and talking to someone? Sitting quietly after lunch without alcohol? In a car, while stopped for a few minutes in traffic?   Total = 2/ 24 points   FSS endorsed at 17/ 63 points.   Social History   Socioeconomic History  . Marital status: Single    Spouse name: Not on file  . Number of children: Not on file  . Years of education: Not on file  . Highest education level: Not on file  Occupational History  . Not on file  Tobacco Use  . Smoking status: Never Smoker  . Smokeless tobacco: Never Used  Substance and  Sexual Activity  . Alcohol use: Yes    Comment: on weekends  . Drug use: No  . Sexual activity: Not on file  Other Topics Concern  . Not on file  Social History Narrative  . Not on file   Social Determinants of Health   Financial Resource Strain:   . Difficulty of Paying Living Expenses:   Food Insecurity:   . Worried About Charity fundraiser in the Last Year:   . Arboriculturist in the Last Year:   Transportation Needs:   . Film/video editor (Medical):   Marland Kitchen Lack of Transportation (Non-Medical):   Physical Activity:   . Days of Exercise per Week:   . Minutes of Exercise per Session:   Stress:   . Feeling of Stress :   Social Connections:   . Frequency of Communication with Friends and Family:   . Frequency of Social Gatherings with Friends and Family:   . Attends Religious Services:   . Active Member of Clubs or Organizations:   . Attends Archivist Meetings:   Marland Kitchen Marital Status:     Family History  Problem Relation Age of Onset  . Obesity Mother   . Mental illness Mother   . Depression Mother   . Bipolar disorder Mother   . Sleep apnea Mother   . Alcoholism Mother   . Eating disorder Mother   . Irritable bowel syndrome Sister   . Hearing loss Maternal Grandmother     Past Medical History:  Diagnosis Date  . ADD (attention deficit disorder)   . Asthma    childhood  . Asthma   . Colon polyps   . Hip pain   . HTN (hypertension)   . Joint pain   . Overweight     Past Surgical History:  Procedure Laterality Date  . arm Right   . TONSILLECTOMY    . WISDOM TOOTH EXTRACTION       Current Outpatient Medications on File Prior to Visit  Medication Sig Dispense Refill  . ibuprofen (ADVIL) 200 MG tablet Take 200 mg by mouth as needed.    . Vitamin D, Ergocalciferol, (DRISDOL) 1.25 MG (50000 UNIT) CAPS capsule Take 1 capsule (50,000 Units total) by mouth every 7 (seven) days. 4 capsule 0   No current facility-administered medications on file  prior to visit.   Physical exam:  Today's Vitals   05/17/20 0956  BP: 138/82  Pulse: 74  Weight: (!) 372 lb (168.7 kg)  Height: 6\' 4"  (1.93 m)   Body mass index is 45.28 kg/m.   Wt Readings from  Last 3 Encounters:  05/17/20 (!) 372 lb (168.7 kg)  05/16/20 (!) 366 lb (166 kg)  05/11/20 (!) 361 lb (163.7 kg)     Ht Readings from Last 3 Encounters:  05/17/20 6\' 4"  (1.93 m)  05/16/20 6\' 4"  (1.93 m)  05/11/20 6\' 3"  (1.905 m)      General: The patient is awake, alert and appears not in acute distress.  The patient is well groomed. Head: Normocephalic, atraumatic. Neck is supple. Mallampati 2  neck circumference: 18 inches . Nasal airflow patent- but frequently congested.  Retrognathia is not seen.  Dental status: intact  Cardiovascular:  Regular rate and cardiac rhythm by pulse,  without distended neck veins. Respiratory: Lungs are clear to auscultation.  Skin:  Without evidence of ankle edema, or rash. Trunk: The patient's posture is erect.   Neurologic exam : The patient is awake and alert, oriented to place and time.   Memory subjective described as intact.  Attention span & concentration ability appears normal.  Speech is fluent,  without  dysarthria, dysphonia or aphasia.  Mood and affect are appropriate.   Cranial nerves: no loss of smell or taste reported  Pupils are equal and briskly reactive to light. Funduscopic exam deferred. .  Extraocular movements in vertical and horizontal planes were intact and without nystagmus.  No Diplopia. Visual fields by finger perimetry are intact. Hearing was intact to soft voice and finger rubbing.   Facial sensation intact to fine touch.  Facial motor strength is symmetric -and tongue and uvula move midline.  Neck ROM : rotation, tilt and flexion extension were normal for age and shoulder shrug was symmetrical.    Motor exam:  Symmetric bulk, tone and ROM.   Normal tone without cog wheeling, symmetric grip strength, good grip  strength.  .   Sensory:  Fine touch, pinprick and vibration were tested  and  normal.  Proprioception tested in the upper extremities was normal.   Coordination: Rapid alternating movements in the fingers/hands were of normal speed.  The Finger-to-nose maneuver was intact without evidence of ataxia, dysmetria or tremor.   Gait and station: Patient could rise unassisted from a seated position, walked without assistive device.  Stance is of normal width/ base and the patient turned with 3 steps.  Toe and heel walk were deferred.  Deep tendon reflexes: in the  upper and lower extremities are symmetrically attenuated. .  Babinski response was deferred .      After spending a total time of 45 minutes face to face and additional time for physical and neurologic examination, review of laboratory studies,  personal review of imaging studies, reports and results of other testing and review of referral information / records as far as provided in visit, I have established the following assessments:  1) I have the pleasure of meeting Travis [name] today, he has been successfully reducing his body mass index by about 40 pounds and 2 points on the BMI scale since joining the healthy weight and maintenance program.  He is seen here noxious to support further weight loss but also because he did develop some chest pain and has been undergone an echocardiogram which has returned as normal the study date was 05-11-20,   2)He reports a near syncope spell- never LOC- he felt dizzy, chest tightness, ED visit followed. See report from ED.   3) our goal is to evaluate for the possibility of atrial fibrillation, and also if sleep apnea should be identified treatment  may help to reduce the risk of hypertension, future diabetes, and will assist in losing further weight.  Deeper sleep stages that can regularly be achieved at night help with secretion of insulin and his hypertension control. His main risk factor is  weight at this time- BMI still 45.   4) Further the need to identify if his RLS is associated with PLMs and related arousals.    My Plan is to proceed with:  1) HST or PSG are both appropriate for apnea screening, but only a PSG will allow to evaluate for RLS/ PLMD.  I hope to find answers about the pre- syncope/ near syncope this way as well.     I would like to thank Dr. Raliegh Ip and NP Loleta Books, MD, as well as   Shelda Pal, [address],  [place] [address] for allowing me to meet with and to take care of this pleasant patient.   II plan to follow up either personally or through our NP within 2-3 month.   CC: I will share my notes with PCP also.   Electronically signed by: Larey Seat, MD 05/17/2020 10:10 AM  [facility] Board certified by The [employer] of Sleep Medicine and Diplomate of the [employer] of Sleep Medicine. Board certified In Neurology through the [place], Fellow of the [employer] of Neurology. Medical Director of [facility].

## 2020-05-25 ENCOUNTER — Encounter (INDEPENDENT_AMBULATORY_CARE_PROVIDER_SITE_OTHER): Payer: Self-pay | Admitting: Family Medicine

## 2020-05-25 ENCOUNTER — Other Ambulatory Visit: Payer: Self-pay

## 2020-05-25 ENCOUNTER — Ambulatory Visit (INDEPENDENT_AMBULATORY_CARE_PROVIDER_SITE_OTHER): Payer: BC Managed Care – PPO | Admitting: Family Medicine

## 2020-05-25 VITALS — BP 125/79 | HR 67 | Temp 98.0°F | Ht 76.0 in | Wt 362.0 lb

## 2020-05-25 DIAGNOSIS — E8881 Metabolic syndrome: Secondary | ICD-10-CM | POA: Insufficient documentation

## 2020-05-25 DIAGNOSIS — Z6841 Body Mass Index (BMI) 40.0 and over, adult: Secondary | ICD-10-CM

## 2020-05-25 DIAGNOSIS — E88819 Insulin resistance, unspecified: Secondary | ICD-10-CM | POA: Insufficient documentation

## 2020-05-25 DIAGNOSIS — R110 Nausea: Secondary | ICD-10-CM

## 2020-05-25 HISTORY — DX: Metabolic syndrome: E88.81

## 2020-05-25 HISTORY — DX: Insulin resistance, unspecified: E88.819

## 2020-05-25 NOTE — Progress Notes (Signed)
Chief Complaint:   OBESITY Travis [name] is here to discuss his progress with his obesity treatment plan along with follow-up of his obesity related diagnoses. Travis [name] is on the Category 4 Plan and states he is following his eating plan approximately 100% of the time. Travis [name] states he is doing 0 minutes 0 times per week.  Today's visit was #: 9 Starting weight: 399 lbs Starting date: 01/05/2020 Today's weight: 362 lbs Today's date: 05/25/2020 Total lbs lost to date: 37 Total lbs lost since last in-office visit: 0  Interim History: Travis [name] went to [place] the last weekend since he had a family get together. He has followed the plan otherwise almost completely. He voices he felt nauseous while away but he didn't want to take Zofran (no improvement with anything except time). He denies obstacle in the next few weeks. He notes being hungry even after eating everything on the plan.  Subjective:   1. Nausea Travis [name] has had sparse intermittent nausea 3 times the past few months. He previously took Zofran with improvement of nausea. He is not taking daily multivitamins, and he is not drinking ginger ale or taking ginger candies.  2. Insulin resistance Travis [name]'s last A1c was 5.5 and insulin 15.6. He is not on any medications.  Assessment/Plan:   1. Nausea Travis [name] is to be aware of symptom onsets and if anything makes it better.  2. Insulin resistance Travis [name] will continue to work on weight loss, exercise, and decreasing simple carbohydrates to help decrease the risk of diabetes. We will follow up on labs at his next appointment. Travis [name] agreed to follow-up with [name] as directed to closely monitor his progress.  3. Class 3 severe obesity with serious comorbidity and body mass index (BMI) of 45.0 to 49.9 in adult, unspecified obesity type Travis [name]) Travis [name] is currently in the action stage of change. As such, his goal is to continue with weight loss efforts. He has agreed to the Category 4 Plan + 200  calories.   Exercise goals: No exercise has been prescribed at this time.  Behavioral modification strategies: increasing lean protein intake, increasing vegetables, meal planning and cooking strategies and keeping healthy foods in the home.  Travis [name] has agreed to follow-up with our clinic in 2 weeks. He was informed of the importance of frequent follow-up visits to maximize his success with intensive lifestyle modifications for his multiple health conditions.   Objective:   Blood pressure 125/79, pulse 67, temperature 98 F (36.7 C), temperature source Oral, height 6\' 4"  (1.93 m), weight (!) 362 lb (164.2 kg), SpO2 98 %. Body mass index is 44.06 kg/m.  General: Cooperative, alert, well developed, in no acute distress. HEENT: Conjunctivae and lids unremarkable. Cardiovascular: Regular rhythm.  Lungs: Normal work of breathing. Neurologic: No focal deficits.   Lab Results  Component Value Date   CREATININE 0.77 05/04/2020   BUN 12 05/04/2020   NA 139 05/04/2020   K 4.3 05/04/2020   CL 105 05/04/2020   CO2 24 05/04/2020   Lab Results  Component Value Date   ALT 49 (H) 01/31/2020   AST 24 01/31/2020   ALKPHOS 50 01/31/2020   BILITOT 0.5 01/31/2020   Lab Results  Component Value Date   HGBA1C 5.5 01/05/2020   HGBA1C 5.6 07/29/2019   Lab Results  Component Value Date   INSULIN 15.6 01/31/2020   INSULIN CANCELED 01/05/2020   Lab Results  Component Value Date   TSH 1.580 01/31/2020   Lab Results  Component Value Date  CHOL 191 01/31/2020   HDL 50 01/31/2020   LDLCALC 129 (H) 01/31/2020   TRIG 63 01/31/2020   CHOLHDL 4 07/28/2019   Lab Results  Component Value Date   WBC 4.8 05/04/2020   HGB 15.4 05/04/2020   HCT 46.0 05/04/2020   MCV 84.2 05/04/2020   PLT 210 05/04/2020   No results found for: IRON, TIBC, FERRITIN  Attestation Statements:   Reviewed by clinician on day of visit: allergies, medications, problem list, medical history, surgical history,  family history, social history, and previous encounter notes.  Time spent on visit including pre-visit chart review and post-visit care and charting was 15 minutes.    I, Trixie Dredge, am acting as transcriptionist for Coralie Common, MD.  I have reviewed the above documentation for accuracy and completeness, and I agree with the above. - Jinny Blossom, MD

## 2020-05-26 ENCOUNTER — Ambulatory Visit (INDEPENDENT_AMBULATORY_CARE_PROVIDER_SITE_OTHER): Payer: BC Managed Care – PPO | Admitting: Cardiology

## 2020-05-26 ENCOUNTER — Encounter: Payer: Self-pay | Admitting: Cardiology

## 2020-05-26 VITALS — BP 130/78 | HR 72 | Ht 76.0 in | Wt 366.0 lb

## 2020-05-26 DIAGNOSIS — R002 Palpitations: Secondary | ICD-10-CM | POA: Diagnosis not present

## 2020-05-26 DIAGNOSIS — E782 Mixed hyperlipidemia: Secondary | ICD-10-CM

## 2020-05-26 NOTE — Patient Instructions (Signed)
Medication Instructions:  No medication changes. *If you need a refill on your cardiac medications before your next appointment, please call your pharmacy*   Lab Work: None ordered If you have labs (blood work) drawn today and your tests are completely normal, you will receive your results only by: . MyChart Message (if you have MyChart) OR . A paper copy in the mail If you have any lab test that is abnormal or we need to change your treatment, we will call you to review the results.   Testing/Procedures: None ordered   Follow-Up: At [facility], you and your health needs are our priority.  As part of our continuing mission to provide you with exceptional heart care, we have created designated Provider Care Teams.  These Care Teams include your primary Cardiologist (physician) and Advanced Practice Providers (APPs -  Physician Assistants and Nurse Practitioners) who all work together to provide you with the care you need, when you need it.  We recommend signing up for the patient portal called "MyChart".  Sign up information is provided on this After Visit Summary.  MyChart is used to connect with patients for Virtual Visits (Telemedicine).  Patients are able to view lab/test results, encounter notes, upcoming appointments, etc.  Non-urgent messages can be sent to your provider as well.   To learn more about what you can do with MyChart, go to [email or web address].    Your next appointment:   3 month(s)  The format for your next appointment:   In Person  Provider:   Kardie Tobb, DO   Other Instructions NA 

## 2020-05-26 NOTE — Progress Notes (Signed)
Cardiology Office Note:    Date:  05/26/2020   ID:  Travis [name], DOB 29-Sep-1986, MRN [id number]  PCP:  Shelda Pal, DO  Cardiologist:  No primary care provider on file.  Electrophysiologist:  None   Referring MD: Shelda Pal*   Chief Complaint  Patient presents with  . Follow-up    History of Present Illness:    Travis [name] is a 34 y.o. male with a hx of upper lipidemia, morbid obesity presented on May 10, 2020 to be evaluated for atypical chest pain shortness of breath as well as palpitations.  At the end of his visit the patient was referred for pharmacologic nuclear stress test, and echo as well as a monitor was placed on the patient.  In the interim he was able to get the stress test as well as his echo which were all reported to be normal.  He recently stopped wearing his monitor and mailed his back on Wednesday this information has not been downloaded yet to [name]. He tells me that the chest pain has resolved and the palpitations also has stopped he has not had any recurrent palpitations.  He feels much better.  Past Medical History:  Diagnosis Date  . ADD (attention deficit disorder)   . Asthma    childhood  . Asthma   . Colon polyps   . Hip pain   . HTN (hypertension)   . Joint pain   . Overweight     Past Surgical History:  Procedure Laterality Date  . arm Right   . TONSILLECTOMY    . WISDOM TOOTH EXTRACTION      Current Medications: Current Meds  Medication Sig  . ibuprofen (ADVIL) 200 MG tablet Take 200 mg by mouth as needed.  . Vitamin D, Ergocalciferol, (DRISDOL) 1.25 MG (50000 UNIT) CAPS capsule Take 1 capsule (50,000 Units total) by mouth every 7 (seven) days.     Allergies:   Patient has no known allergies.   Social History   Socioeconomic History  . Marital status: Single    Spouse name: Not on file  . Number of children: Not on file  . Years of education: Not on file  . Highest education level: Not on file    Occupational History  . Not on file  Tobacco Use  . Smoking status: Never Smoker  . Smokeless tobacco: Never Used  Substance and Sexual Activity  . Alcohol use: Yes    Comment: on weekends  . Drug use: No  . Sexual activity: Not on file  Other Topics Concern  . Not on file  Social History Narrative  . Not on file   Social Determinants of Health   Financial Resource Strain:   . Difficulty of Paying Living Expenses: Not on file  Food Insecurity:   . Worried About Charity fundraiser in the Last Year: Not on file  . Ran Out of Food in the Last Year: Not on file  Transportation Needs:   . Lack of Transportation (Medical): Not on file  . Lack of Transportation (Non-Medical): Not on file  Physical Activity:   . Days of Exercise per Week: Not on file  . Minutes of Exercise per Session: Not on file  Stress:   . Feeling of Stress : Not on file  Social Connections:   . Frequency of Communication with Friends and Family: Not on file  . Frequency of Social Gatherings with Friends and Family: Not on file  . Attends  Religious Services: Not on file  . Active Member of Clubs or Organizations: Not on file  . Attends Archivist Meetings: Not on file  . Marital Status: Not on file     Family History: The patient's family history includes Alcoholism in his mother; Bipolar disorder in his mother; Depression in his mother; Eating disorder in his mother; Hearing loss in his maternal grandmother; Irritable bowel syndrome in his sister; Mental illness in his mother; Obesity in his mother; Sleep apnea in his mother.  ROS:   Review of Systems  Constitution: Negative for decreased appetite, fever and weight gain.  HENT: Negative for congestion, ear discharge, hoarse voice and sore throat.   Eyes: Negative for discharge, redness, vision loss in right eye and visual halos.  Cardiovascular: Negative for chest pain, dyspnea on exertion, leg swelling, orthopnea and palpitations.   Respiratory: Negative for cough, hemoptysis, shortness of breath and snoring.   Endocrine: Negative for heat intolerance and polyphagia.  Hematologic/Lymphatic: Negative for bleeding problem. Does not bruise/bleed easily.  Skin: Negative for flushing, nail changes, rash and suspicious lesions.  Musculoskeletal: Negative for arthritis, joint pain, muscle cramps, myalgias, neck pain and stiffness.  Gastrointestinal: Negative for abdominal pain, bowel incontinence, diarrhea and excessive appetite.  Genitourinary: Negative for decreased libido, genital sores and incomplete emptying.  Neurological: Negative for brief paralysis, focal weakness, headaches and loss of balance.  Psychiatric/Behavioral: Negative for altered mental status, depression and suicidal ideas.  Allergic/Immunologic: Negative for HIV exposure and persistent infections.    EKGs/Labs/Other Studies Reviewed:    The following studies were reviewed today:   EKG:  The ekg ordered today demonstrates   Echo IMPRESSIONS May 11, 2020 1. Left ventricular ejection fraction, by estimation, is 60 to 65%. The  left ventricle has normal function. The left ventricle has no regional  wall motion abnormalities. Left ventricular diastolic parameters were  normal.  2. Right ventricular systolic function is normal. The right ventricular  size is normal.  3. The mitral valve is normal in structure. No evidence of mitral valve  regurgitation. No evidence of mitral stenosis.  4. The aortic valve is tricuspid. Aortic valve regurgitation is not  visualized. No aortic stenosis is present.  5. The inferior vena cava is normal in size with greater than 50%  respiratory variability, suggesting right atrial pressure of 3 mmHg.   Pharmacological stress test May 17, 2020  Nuclear stress EF: 56%.  There was no ST segment deviation noted during stress.  The study is normal.  This is a low risk study.  The left ventricular ejection  fraction is normal (55-65%).  No ischemia.     Recent Labs: 01/31/2020: ALT 49; TSH 1.580 05/04/2020: BUN 12; Creatinine, Ser 0.77; Hemoglobin 15.4; Platelets 210; Potassium 4.3; Sodium 139  Recent Lipid Panel    Component Value Date/Time   CHOL 191 01/31/2020 1521   TRIG 63 01/31/2020 1521   HDL 50 01/31/2020 1521   CHOLHDL 4 07/28/2019 1012   VLDL 11.8 07/28/2019 1012   LDLCALC 129 (H) 01/31/2020 1521    Physical Exam:    VS:  BP 130/78 (BP Location: Right Arm, Patient Position: Sitting, Cuff Size: Large)   Pulse 72   Ht 6\' 4"  (1.93 m)   Wt (!) 366 lb (166 kg)   SpO2 93%   BMI 44.55 kg/m     Wt Readings from Last 3 Encounters:  05/26/20 (!) 366 lb (166 kg)  05/25/20 (!) 362 lb (164.2 kg)  05/17/20 Marland Kitchen)  372 lb (168.7 kg)     GEN: Well nourished, well developed in no acute distress HEENT: Normal NECK: No JVD; No carotid bruits LYMPHATICS: No lymphadenopathy CARDIAC: S1S2 noted,RRR, no murmurs, rubs, gallops RESPIRATORY:  Clear to auscultation without rales, wheezing or rhonchi  ABDOMEN: Soft, non-tender, non-distended, +bowel sounds, no guarding. EXTREMITIES: No edema, No cyanosis, no clubbing MUSCULOSKELETAL:  No deformity  SKIN: Warm and dry NEUROLOGIC:  Alert and oriented x 3, non-focal PSYCHIATRIC:  Normal affect, good insight  ASSESSMENT:    1. Mixed hyperlipidemia   2. Morbid obesity ([place])   3. Palpitations    PLAN:     The patient wanted to know if he can get back to working out in the gym.  I told him that for now it is okay he should listen to his body and if he feels worsening shortness of breath he can stop the workout.  I was able to discuss his echocardiogram result as well as his nuclear stress test results with him.    The patient is in agreement with the above plan. The patient left the office in stable condition.  The patient will follow up in   Medication Adjustments/Labs and Tests Ordered: Current medicines are reviewed at length with  the patient today.  Concerns regarding medicines are outlined above.  No orders of the defined types were placed in this encounter.  No orders of the defined types were placed in this encounter.   Patient Instructions  Medication Instructions:  No medication changes. *If you need a refill on your cardiac medications before your next appointment, please call your pharmacy*   Lab Work: None ordered If you have labs (blood work) drawn today and your tests are completely normal, you will receive your results only by: Marland Kitchen MyChart Message (if you have MyChart) OR . A paper copy in the mail If you have any lab test that is abnormal or we need to change your treatment, we will call you to review the results.   Testing/Procedures: None ordered   Follow-Up: At [facility], you and your health needs are our priority.  As part of our continuing mission to provide you with exceptional heart care, we have created designated Provider Care Teams.  These Care Teams include your primary Cardiologist (physician) and Advanced Practice Providers (APPs -  Physician Assistants and Nurse Practitioners) who all work together to provide you with the care you need, when you need it.  We recommend signing up for the patient portal called "MyChart".  Sign up information is provided on this After Visit Summary.  MyChart is used to connect with patients for Virtual Visits (Telemedicine).  Patients are able to view lab/test results, encounter notes, upcoming appointments, etc.  Non-urgent messages can be sent to your provider as well.   To learn more about what you can do with MyChart, go to [email or web address].    Your next appointment:   3 month(s)  The format for your next appointment:   In Person  Provider:   Berniece Salines, DO   Other Instructions NA     Adopting a Healthy Lifestyle.  Know what a healthy weight is for you (roughly BMI <25) and aim to maintain this   Aim for 7+ servings of  fruits and vegetables daily   65-80+ fluid ounces of water or unsweet tea for healthy kidneys   Limit to max 1 drink of alcohol per day; avoid smoking/tobacco   Limit animal fats in diet for cholesterol and  heart health - choose grass fed whenever available   Avoid highly processed foods, and foods high in saturated/trans fats   Aim for low stress - take time to unwind and care for your mental health   Aim for 150 min of moderate intensity exercise weekly for heart health, and weights twice weekly for bone health   Aim for 7-9 hours of sleep daily   When it comes to diets, agreement about the perfect plan isnt easy to find, even among the experts. Experts at the [employer] developed an idea known as the Healthy Eating Plate. Just imagine a plate divided into logical, healthy portions.   The emphasis is on diet quality:   Load up on vegetables and fruits - one-half of your plate: Aim for color and variety, and remember that potatoes dont count.   Go for whole grains - one-quarter of your plate: Whole wheat, barley, wheat berries, quinoa, oats, brown rice, and foods made with them. If you want pasta, go with whole wheat pasta.   Protein power - one-quarter of your plate: Fish, chicken, beans, and nuts are all healthy, versatile protein sources. Limit red meat.   The diet, however, does go beyond the plate, offering a few other suggestions.   Use healthy plant oils, such as olive, canola, soy, corn, sunflower and peanut. Check the labels, and avoid partially hydrogenated oil, which have unhealthy trans fats.   If youre thirsty, drink water. Coffee and tea are good in moderation, but skip sugary drinks and limit milk and dairy products to one or two daily servings.   The type of carbohydrate in the diet is more important than the amount. Some sources of carbohydrates, such as vegetables, fruits, whole grains, and beans-are healthier than others.   Finally, stay  active  Signed, Berniece Salines, DO  05/26/2020 3:24 PM    [facility]

## 2020-05-29 ENCOUNTER — Ambulatory Visit (INDEPENDENT_AMBULATORY_CARE_PROVIDER_SITE_OTHER): Payer: BC Managed Care – PPO | Admitting: Neurology

## 2020-05-29 ENCOUNTER — Ambulatory Visit: Payer: BC Managed Care – PPO | Admitting: Cardiology

## 2020-05-29 DIAGNOSIS — R0683 Snoring: Secondary | ICD-10-CM

## 2020-05-29 DIAGNOSIS — G4733 Obstructive sleep apnea (adult) (pediatric): Secondary | ICD-10-CM | POA: Diagnosis not present

## 2020-05-29 DIAGNOSIS — R002 Palpitations: Secondary | ICD-10-CM

## 2020-05-29 DIAGNOSIS — R55 Syncope and collapse: Secondary | ICD-10-CM

## 2020-05-29 DIAGNOSIS — G2581 Restless legs syndrome: Secondary | ICD-10-CM

## 2020-05-29 DIAGNOSIS — R079 Chest pain, unspecified: Secondary | ICD-10-CM

## 2020-06-07 ENCOUNTER — Other Ambulatory Visit (INDEPENDENT_AMBULATORY_CARE_PROVIDER_SITE_OTHER): Payer: Self-pay | Admitting: Family Medicine

## 2020-06-07 DIAGNOSIS — E559 Vitamin D deficiency, unspecified: Secondary | ICD-10-CM

## 2020-06-13 NOTE — Procedures (Signed)
PATIENT'S NAME:  Shamir, Tuzzolino DOB:      1986-01-09      MR#:    [id number]     DATE OF RECORDING: 05/29/2020 REFERRING M.D.:  Crosby Oyster [place], DO Study Performed:   Baseline Polysomnogram HISTORY:  Mr. Dorian is a 34 - year- old biracial male patient and was seen upon referral from Benay Pillow, NP on 05/17/2020 . Chief concern according to patient: "I am needing help to lose weight, I had also heart flutter, cardiologist placed me on heart monitor, echo 05-11-2020, and myo-view just yesterday" and :" I lost already about 40 pounds since March but had chest pain just last Saturday".     I have the pleasure of seeing DESTINE AMBROISE today, a right -handed biracial male with a possible sleep disorder. He has a past medical history of ADD (attention deficit disorder), Asthma, Colon polyps, HTN (hypertension), hip/Joint pain, and being Overweight. He had contracted Covid in 2020 and has been vaccinated since. He reports nocturnal intermittent palpitation.      The patient goes to bed at 10.30 PM and struggles to fall asleep- most nights it takes 45 minutes to 90 minutes (!)> Girlfriend snores.  He has reported that he has been sleeping better- just over the last 14 days.   He usually continues to sleep for intervals of 2-3 hours, wakes for unknown reasons-, cyclic, month of good sleep, alternating with month of poorer sleep.  The preferred sleep position is prone, with the support of 1 pillow.  Dreams are reportedly frequent.  6-7 AM is the usual rise time. The patient wakes up spontaneously.  He reports mostly feeling refreshed / restored in AM- but there is a cyclic pattern here too. He has a dry mouth- but morning headaches and residual fatigue.  Sometimes legs are sore. Sometimes he needs to massage his legs. Naps are taken a rarely - as these are causing him to have poor sleep the night after.   The patient endorsed the Epworth Sleepiness Scale at 2 points.   The patient's weight 373 pounds  with a height of 76 (inches), resulting in a BMI of 45.4 kg/m2. The patient's neck circumference measured 18 inches.  CURRENT MEDICATIONS: Advil   PROCEDURE:  This is a multichannel digital polysomnogram utilizing the Somnostar 11.2 system.  Electrodes and sensors were applied and monitored per AASM Specifications.   EEG, EOG, Chin and Limb EMG, were sampled at 200 Hz.  ECG, Snore and Nasal Pressure, Thermal Airflow, Respiratory Effort, CPAP Flow and Pressure, Oximetry was sampled at 50 Hz. Digital video and audio were recorded.      BASELINE STUDY: Lights Out was at 22:36 and Lights On at 05:01.  Total recording time (TRT) was 385 minutes, with a total sleep time (TST) of 305.5 minutes.  The patient's sleep latency was 57.5 minutes.  REM latency was 212.5 minutes.  The sleep efficiency was 79.4 %.     SLEEP ARCHITECTURE: WASO (Wake after sleep onset) was 73.5 minutes.  There were 57 minutes in Stage N1, 131.5 minutes Stage N2, 83.5 minutes Stage N3 and 33.5 minutes in Stage REM.  The percentage of Stage N1 was 18.7%, Stage N2 was 43.%, Stage N3 was 27.3% and Stage R (REM sleep) was 11.%.    RESPIRATORY ANALYSIS:  There were a total of 13 respiratory events:  13 hypopneas with a hypopnea index of 2.6 /hour. The patient also had few respiratory event related arousals (RERAs).  The total APNEA/HYPOPNEA INDEX (AHI) was 2.6/hour and the total RESPIRATORY DISTURBANCE INDEX was 3.0 /hour.  4 events occurred in REM sleep and 18 events in NREM. The REM AHI was 7.2 /hour, versus a non-REM AHI of 2.0/h. The patient spent 69.5 minutes of total sleep time in the supine position and 236 minutes in non-supine. The supine AHI was 4.3/h versus a non-supine AHI of 2.0/h.  OXYGEN SATURATION & C02:  The Wake baseline 02 saturation was 96%, with the lowest being 89%. Time spent below 89% saturation equaled 0 minutes.  The arousals were noted as: 51 were spontaneous, 0 were associated with PLMs, 5 were associated  with respiratory events. The patient had a total of 0 Periodic Limb Movements.   Audio and video analysis did not show any abnormal or unusual movements, behaviors, phonations or vocalizations.  There were 2 bathroom breaks. Mild, soft Snoring was noted.  EKG was in keeping with normal sinus rhythm (NSR).   IMPRESSION:  1. No clinically significant degree of Obstructive Sleep Apnea (OSA), overall AHI was 2.6/h and REM AHI was 7.2/h and supine AHI was 4.3/h. 2. No Periodic Limb Movement Disorder (PLMD) is present.  3. Soft Snoring, loudest in supine.  4. A high proportion of slow wave sleep (AKA Delta sleep, N3 sleep) was noted, and a reduced proportion of REM sleep in comparison to peer group.   RECOMMENDATIONS: The reported cardiac palpitations are unrelated to OSA or hypoxemia during sleep.  Avoiding supine sleep is best to reduce snoring volume, and snoring will decrease with weight loss as well. Total sleep time was just over 5 hours.  We can discuss medications to improve sleep duration in our revisit.    I certify that I have reviewed the entire raw data recording prior to the issuance of this report in accordance with the Standards of Accreditation of the American Academy of Sleep Medicine (AASM)   Larey Seat, MD Diplomat, American Board of Psychiatry and Neurology  Diplomat, American Board of Sleep Medicine Market researcher, [place] [facility] [employer]

## 2020-06-13 NOTE — Progress Notes (Signed)
IMPRESSION:   1. No clinically significant degree of Obstructive Sleep Apnea  (OSA) was present, overall AHI was 2.6/h and REM AHI was 7.2/h and supine AHI  was 4.3/h.  2. No Periodic Limb Movement Disorder (PLMD) is present.  3. Soft Snoring, loudest in supine.  4. A high proportion of slow wave sleep (AKA Delta sleep, N3  sleep) was noted, and a reduced proportion of REM sleep when seen in  comparison to peer group.   RECOMMENDATIONS: The reported cardiac palpitations are unrelated  to OSA or hypoxemia during sleep.  Avoiding supine sleep is best to reduce snoring volume, and  snoring will decrease with weight loss as well. Total sleep time  was just over 5 hours.  We can discuss medications to improve sleep duration in our  revisit.

## 2020-06-14 ENCOUNTER — Institutional Professional Consult (permissible substitution): Payer: BC Managed Care – PPO | Admitting: Neurology

## 2020-06-14 ENCOUNTER — Telehealth: Payer: Self-pay | Admitting: Neurology

## 2020-06-14 NOTE — Telephone Encounter (Signed)
-----   Message from Larey Seat, MD sent at 06/13/2020  1:07 PM EDT ----- IMPRESSION:   1. No clinically significant degree of Obstructive Sleep Apnea  (OSA) was present, overall AHI was 2.6/h and REM AHI was 7.2/h and supine AHI  was 4.3/h.  2. No Periodic Limb Movement Disorder (PLMD) is present.  3. Soft Snoring, loudest in supine.  4. A high proportion of slow wave sleep (AKA Delta sleep, N3  sleep) was noted, and a reduced proportion of REM sleep when seen in  comparison to peer group.   RECOMMENDATIONS: The reported cardiac palpitations are unrelated  to OSA or hypoxemia during sleep.  Avoiding supine sleep is best to reduce snoring volume, and  snoring will decrease with weight loss as well. Total sleep time  was just over 5 hours.  We can discuss medications to improve sleep duration in our  revisit.

## 2020-06-14 NOTE — Telephone Encounter (Signed)
Called the patient and reviewed the sleep study with him in detail. Advised the patient to avoid sleeping on back and encouraged weight loss to help with decreasing snoring. Pt inquired about discussing medications to help improve sleep duration. Advised we can schedule follow up visit to discuss in more detail. Patient scheduled 07/11/20 at 1:30 pm. Pt verbalized understanding.

## 2020-06-15 ENCOUNTER — Ambulatory Visit (INDEPENDENT_AMBULATORY_CARE_PROVIDER_SITE_OTHER): Payer: BC Managed Care – PPO | Admitting: Family Medicine

## 2020-06-15 ENCOUNTER — Encounter (INDEPENDENT_AMBULATORY_CARE_PROVIDER_SITE_OTHER): Payer: Self-pay | Admitting: Family Medicine

## 2020-06-15 ENCOUNTER — Other Ambulatory Visit: Payer: Self-pay

## 2020-06-15 VITALS — BP 118/84 | HR 83 | Temp 98.0°F | Ht 76.0 in | Wt 360.0 lb

## 2020-06-15 DIAGNOSIS — F411 Generalized anxiety disorder: Secondary | ICD-10-CM | POA: Diagnosis not present

## 2020-06-15 DIAGNOSIS — Z9189 Other specified personal risk factors, not elsewhere classified: Secondary | ICD-10-CM

## 2020-06-15 DIAGNOSIS — Z6841 Body Mass Index (BMI) 40.0 and over, adult: Secondary | ICD-10-CM

## 2020-06-15 DIAGNOSIS — E559 Vitamin D deficiency, unspecified: Secondary | ICD-10-CM

## 2020-06-15 MED ORDER — VITAMIN D (ERGOCALCIFEROL) 1.25 MG (50000 UNIT) PO CAPS: 50000.0000 [IU] | ORAL_CAPSULE | ORAL | 0 refills | Status: DC

## 2020-06-15 MED ORDER — FLUOXETINE HCL 20 MG PO TABS: 20.0000 mg | ORAL_TABLET | Freq: Every day | ORAL | 0 refills | Status: DC

## 2020-06-15 NOTE — Progress Notes (Signed)
Chief Complaint:   OBESITY Travis [name] is here to discuss his progress with his obesity treatment plan along with follow-up of his obesity related diagnoses. Travis [name] is on the Category 4 Plan + 200 calories and states he is following his eating plan approximately 65-70% of the time. Travis [name] states he is doing 0 minutes 0 times per week.  Today's visit was #: 10 Starting weight: 399 lbs Starting date: 01/05/2020 Today's weight: 360 lbs Today's date: 06/15/2020 Total lbs lost to date: 39 Total lbs lost since last in-office visit: 2  Interim History: Travis [name] hasn't returned to the gym and he has been working outside all day. He is struggling to remain committed to meals plan. He feels like he is missing out on certain foods. He has an upcoming trip to [place] planned.  Subjective:   1. Generalized anxiety disorder Travis [name] is having both stress and health related anxiety. He voices some struggles with sleep and motivation.  2. Vitamin D deficiency Travis [name] denies nausea, vomiting, or muscle weakness, but he notes fatigue. He is on prescription Vit D.  3. At risk for anxiety Travis [name] is at risk of developing anxiety due to significant health related anxiety.  Assessment/Plan:   1. Generalized anxiety disorder Behavior modification techniques were discussed today to help Press deal with his anxiety. Kip agreed to start Prozac 20 mg PO daily with no refill. Orders and follow up as documented in patient record.   - FLUoxetine (PROZAC) 20 MG tablet; Take 1 tablet (20 mg total) by mouth daily.  Dispense: 30 tablet; Refill: 0  2. Vitamin D deficiency Low Vitamin D level contributes to fatigue and are associated with obesity, breast, and colon cancer. We will refill prescription Vitamin D for 1 month. [name] will follow-up for routine testing of Vitamin D, at least 2-3 times per year to avoid over-replacement.  - Vitamin D, Ergocalciferol, (DRISDOL) 1.25 MG (50000 UNIT) CAPS capsule; Take 1  capsule (50,000 Units total) by mouth every 7 (seven) days.  Dispense: 4 capsule; Refill: 0  3. At risk for anxiety Travis [name] was given approximately 15 minutes of anxiety risk counseling today. He has risk factors for anxiety. We discussed the importance of a healthy work life balance, a healthy relationship with food and a good support system.  Repetitive spaced learning was employed today to elicit superior memory formation and behavioral change.  4. Class 3 severe obesity with serious comorbidity and body mass index (BMI) of 40.0 to 44.9 in adult, unspecified obesity type [facility] is currently in the action stage of change. As such, his goal is to continue with weight loss efforts. He has agreed to the Category 4 Plan.   Exercise goals: No exercise has been prescribed at this time.  Behavioral modification strategies: increasing lean protein intake, increasing vegetables, meal planning and cooking strategies, keeping healthy foods in the home and planning for success.  Travis [name] has agreed to follow-up with our clinic in 2 weeks. He was informed of the importance of frequent follow-up visits to maximize his success with intensive lifestyle modifications for his multiple health conditions.   Objective:   Blood pressure 118/84, pulse 83, temperature 98 F (36.7 C), temperature source Oral, height 6\' 4"  (1.93 m), weight (!) 360 lb (163.3 kg), SpO2 99 %. Body mass index is 43.82 kg/m.  General: Cooperative, alert, well developed, in no acute distress. HEENT: Conjunctivae and lids unremarkable. Cardiovascular: Regular rhythm.  Lungs: Normal work of breathing. Neurologic: No focal deficits.  Lab Results  Component Value Date   CREATININE 0.77 05/04/2020   BUN 12 05/04/2020   NA 139 05/04/2020   K 4.3 05/04/2020   CL 105 05/04/2020   CO2 24 05/04/2020   Lab Results  Component Value Date   ALT 49 (H) 01/31/2020   AST 24 01/31/2020   ALKPHOS 50 01/31/2020   BILITOT 0.5  01/31/2020   Lab Results  Component Value Date   HGBA1C 5.5 01/05/2020   HGBA1C 5.6 07/29/2019   Lab Results  Component Value Date   INSULIN 15.6 01/31/2020   INSULIN CANCELED 01/05/2020   Lab Results  Component Value Date   TSH 1.580 01/31/2020   Lab Results  Component Value Date   CHOL 191 01/31/2020   HDL 50 01/31/2020   LDLCALC 129 (H) 01/31/2020   TRIG 63 01/31/2020   CHOLHDL 4 07/28/2019   Lab Results  Component Value Date   WBC 4.8 05/04/2020   HGB 15.4 05/04/2020   HCT 46.0 05/04/2020   MCV 84.2 05/04/2020   PLT 210 05/04/2020   No results found for: IRON, TIBC, FERRITIN  Attestation Statements:   Reviewed by clinician on day of visit: allergies, medications, problem list, medical history, surgical history, family history, social history, and previous encounter notes.   I, Trixie Dredge, am acting as transcriptionist for Coralie Common, MD.  I have reviewed the above documentation for accuracy and completeness, and I agree with the above. - Jinny Blossom, MD

## 2020-06-23 ENCOUNTER — Encounter (INDEPENDENT_AMBULATORY_CARE_PROVIDER_SITE_OTHER): Payer: Self-pay | Admitting: Family Medicine

## 2020-06-28 ENCOUNTER — Encounter (INDEPENDENT_AMBULATORY_CARE_PROVIDER_SITE_OTHER): Payer: Self-pay | Admitting: Family Medicine

## 2020-06-29 ENCOUNTER — Telehealth (INDEPENDENT_AMBULATORY_CARE_PROVIDER_SITE_OTHER): Payer: Self-pay

## 2020-06-29 ENCOUNTER — Encounter (HOSPITAL_BASED_OUTPATIENT_CLINIC_OR_DEPARTMENT_OTHER): Payer: Self-pay | Admitting: *Deleted

## 2020-06-29 ENCOUNTER — Encounter (INDEPENDENT_AMBULATORY_CARE_PROVIDER_SITE_OTHER): Payer: Self-pay | Admitting: Family Medicine

## 2020-06-29 ENCOUNTER — Emergency Department (HOSPITAL_BASED_OUTPATIENT_CLINIC_OR_DEPARTMENT_OTHER)
Admission: EM | Admit: 2020-06-29 | Discharge: 2020-06-29 | Disposition: A | Discharge status: Home / Self Care | Payer: BC Managed Care – PPO | Attending: Emergency Medicine | Admitting: Emergency Medicine

## 2020-06-29 ENCOUNTER — Emergency Department (HOSPITAL_BASED_OUTPATIENT_CLINIC_OR_DEPARTMENT_OTHER): Payer: BC Managed Care – PPO

## 2020-06-29 ENCOUNTER — Other Ambulatory Visit: Payer: Self-pay

## 2020-06-29 ENCOUNTER — Telehealth (INDEPENDENT_AMBULATORY_CARE_PROVIDER_SITE_OTHER): Payer: BC Managed Care – PPO | Admitting: Family Medicine

## 2020-06-29 VITALS — Ht 76.0 in | Wt 361.0 lb

## 2020-06-29 DIAGNOSIS — J45909 Unspecified asthma, uncomplicated: Secondary | ICD-10-CM | POA: Insufficient documentation

## 2020-06-29 DIAGNOSIS — I10 Essential (primary) hypertension: Secondary | ICD-10-CM | POA: Insufficient documentation

## 2020-06-29 DIAGNOSIS — R002 Palpitations: Secondary | ICD-10-CM | POA: Diagnosis not present

## 2020-06-29 DIAGNOSIS — R079 Chest pain, unspecified: Secondary | ICD-10-CM

## 2020-06-29 DIAGNOSIS — F419 Anxiety disorder, unspecified: Secondary | ICD-10-CM

## 2020-06-29 DIAGNOSIS — R0789 Other chest pain: Secondary | ICD-10-CM | POA: Diagnosis not present

## 2020-06-29 DIAGNOSIS — Z6841 Body Mass Index (BMI) 40.0 and over, adult: Secondary | ICD-10-CM | POA: Diagnosis not present

## 2020-06-29 DIAGNOSIS — Z20822 Contact with and (suspected) exposure to covid-19: Secondary | ICD-10-CM | POA: Diagnosis not present

## 2020-06-29 DIAGNOSIS — E559 Vitamin D deficiency, unspecified: Secondary | ICD-10-CM

## 2020-06-29 LAB — COMPREHENSIVE METABOLIC PANEL
ALT: 49 U/L — ABNORMAL HIGH (ref 0–44)
AST: 26 U/L (ref 15–41)
Albumin: 4.8 g/dL (ref 3.5–5.0)
Alkaline Phosphatase: 46 U/L (ref 38–126)
Anion gap: 13 (ref 5–15)
BUN: 15 mg/dL (ref 6–20)
CO2: 22 mmol/L (ref 22–32)
Calcium: 9.4 mg/dL (ref 8.9–10.3)
Chloride: 98 mmol/L (ref 98–111)
Creatinine, Ser: 0.85 mg/dL (ref 0.61–1.24)
GFR calc Af Amer: >60 mL/min (ref >60)
GFR calc non Af Amer: >60 mL/min (ref >60)
Glucose, Bld: 155 mg/dL — ABNORMAL HIGH (ref 70–99)
Potassium: 3.5 mmol/L (ref 3.5–5.1)
Sodium: 133 mmol/L — ABNORMAL LOW (ref 135–145)
Total Bilirubin: 0.5 mg/dL (ref 0.3–1.2)
Total Protein: 8 g/dL (ref 6.5–8.1)

## 2020-06-29 LAB — TROPONIN I (HIGH SENSITIVITY)
Troponin I (High Sensitivity): 2 ng/L (ref <18)
Troponin I (High Sensitivity): 3 ng/L (ref <18)

## 2020-06-29 LAB — CBC WITH DIFFERENTIAL/PLATELET
Abs Immature Granulocytes: 0.03 10*3/uL (ref 0.00–0.07)
Basophils Absolute: 0 10*3/uL (ref 0.0–0.1)
Basophils Relative: 0 %
Eosinophils Absolute: 0.1 10*3/uL (ref 0.0–0.5)
Eosinophils Relative: 1 %
HCT: 45.7 % (ref 39.0–52.0)
Hemoglobin: 15.4 g/dL (ref 13.0–17.0)
Immature Granulocytes: 1 %
Lymphocytes Relative: 15 %
Lymphs Abs: 0.9 10*3/uL (ref 0.7–4.0)
MCH: 28 pg (ref 26.0–34.0)
MCHC: 33.7 g/dL (ref 30.0–36.0)
MCV: 83.1 fL (ref 80.0–100.0)
Monocytes Absolute: 0.4 10*3/uL (ref 0.1–1.0)
Monocytes Relative: 6 %
Neutro Abs: 4.8 10*3/uL (ref 1.7–7.7)
Neutrophils Relative %: 77 %
Platelets: 205 10*3/uL (ref 150–400)
RBC: 5.5 MIL/uL (ref 4.22–5.81)
RDW: 12.3 % (ref 11.5–15.5)
WBC: 6.2 10*3/uL (ref 4.0–10.5)
nRBC: 0 % (ref 0.0–0.2)

## 2020-06-29 LAB — RESPIRATORY PANEL BY RT PCR (FLU A&B, COVID)
Influenza A by PCR: NEGATIVE
Influenza B by PCR: NEGATIVE
SARS Coronavirus 2 by RT PCR: NEGATIVE

## 2020-06-29 MED ORDER — HYDROXYZINE HCL 25 MG PO TABS: 25.0000 mg | ORAL_TABLET | Freq: Four times a day (QID) | ORAL | 0 refills | Status: DC

## 2020-06-29 MED ORDER — LORAZEPAM 2 MG/ML IJ SOLN
0.5000 mg | Freq: Once | INTRAMUSCULAR | Status: AC
  Administered 2020-06-29: 0.5 mg via INTRAVENOUS
  Filled 2020-06-29: qty 1

## 2020-06-29 MED ORDER — FLUOXETINE HCL 40 MG PO CAPS: 40.0000 mg | ORAL_CAPSULE | Freq: Every day | ORAL | 0 refills | Status: DC

## 2020-06-29 MED ORDER — VITAMIN D (ERGOCALCIFEROL) 1.25 MG (50000 UNIT) PO CAPS: 50000.0000 [IU] | ORAL_CAPSULE | ORAL | 0 refills | Status: DC

## 2020-06-29 MED ORDER — SODIUM CHLORIDE 0.9 % IV BOLUS
1000.0000 mL | Freq: Once | INTRAVENOUS | Status: AC
  Administered 2020-06-29: 1000 mL via INTRAVENOUS

## 2020-06-29 NOTE — ED Triage Notes (Signed)
C/o cp this am x 4 hrs , " palpitations" x 1 hr , hx of same followed by cards

## 2020-06-29 NOTE — ED Provider Notes (Signed)
[name] Provider Note   CSN: [id number] Arrival date & time: 06/29/20  1346     History Chief Complaint  Patient presents with  . Palpitations    Travis [name] is a 34 y.o. male who presents for an episode of palpitations, chest pain that occurred approximately 1:30 PM this afternoon.  Patient reports he was at a meeting at work and states all of a sudden, he felt like his heart was racing.  He states he felt lightheaded, clammy and started having some left-sided chest pain and discomfort.  He states he does not have difficulty breathing.  He reports he had a similar episode approximately 6 weeks ago.  He was evaluated to the ED and had unremarkable work-up.  He was referred to cardiology which she has seen.  He had a Holter monitor which showed no evidence of acute abnormalities.  He also had an stress test as well as an echo.  On initial ED arrival, he states his chest discomfort has improved though he still feels a tingling sensation in his chest and his legs.  He has not had any difficulty breathing.  He states chest pain is not worse with deep inspiration.  He was on exerting himself when this occurred.  He states that he had recently been talking to his primary doctor and had a question of this with some anxiety.  He does report that he recently started a new job and has been stressed.  Additionally, he found out his girlfriend tested positive for COVID-19 yesterday and reports that he was worried and stressed about that.  He did not feel particularly stressed out at the meeting.  He does not smoke.  Denies any cocaine, heroin, marijuana use.  He has not had increased caffeine intake.  No history of cardiac abnormalities.  No family history of MI n before the age of 23. He denies any exogenous hormone use, recent immobilization, prior history of DVT/PE, recent surgery, leg swelling, or long travel.  He denies any fevers, cough, chills.  He denies any shortness  of breath, abdominal pain, nausea/vomiting.  He has been vaccinated for Covid.   The history is provided by the patient.       Past Medical History:  Diagnosis Date  . ADD (attention deficit disorder)   . Asthma    childhood  . Asthma   . Colon polyps   . Hip pain   . HTN (hypertension)   . Joint pain   . Overweight     Patient Active Problem List   Diagnosis Date Noted  . Insulin resistance 05/25/2020  . Snoring 05/17/2020  . Morbid obesity ([place]) 05/17/2020  . Restless legs syndrome (RLS) 05/17/2020  . Chest pain at rest 05/17/2020  . Intermittent palpitations 05/17/2020  . Near syncope 05/17/2020  . Other insomnia 04/03/2020  . Vitamin D deficiency 03/20/2020  . Essential hypertension 03/20/2020  . Class 3 severe obesity with serious comorbidity and body mass index (BMI) of 45.0 to 49.9 in adult [facility] 07/28/2019  . Left hip pain 07/28/2019    Past Surgical History:  Procedure Laterality Date  . arm Right   . TONSILLECTOMY    . WISDOM TOOTH EXTRACTION         Family History  Problem Relation Age of Onset  . Obesity Mother   . Mental illness Mother   . Depression Mother   . Bipolar disorder Mother   . Sleep apnea Mother   . Alcoholism  Mother   . Eating disorder Mother   . Irritable bowel syndrome Sister   . Hearing loss Maternal Grandmother     Social History   Tobacco Use  . Smoking status: Never Smoker  . Smokeless tobacco: Never Used  Substance Use Topics  . Alcohol use: Yes    Comment: on weekends  . Drug use: No    Home Medications Prior to Admission medications   Medication Sig Start Date End Date Taking? Authorizing Provider  FLUoxetine (PROZAC) 40 MG capsule Take 1 capsule (40 mg total) by mouth daily. 06/29/20  Yes Eber Jones, MD  FLUoxetine (PROZAC) 20 MG tablet Take 1 tablet (20 mg total) by mouth daily. 06/15/20   Eber Jones, MD  hydrOXYzine (ATARAX/VISTARIL) 25 MG tablet Take 1 tablet (25 mg total) by mouth  every 6 (six) hours. 06/29/20   Volanda Napoleon, PA-C  ibuprofen (ADVIL) 200 MG tablet Take 200 mg by mouth as needed.    [provider]  Vitamin D, Ergocalciferol, (DRISDOL) 1.25 MG (50000 UNIT) CAPS capsule Take 1 capsule (50,000 Units total) by mouth every 7 (seven) days. 06/29/20   Eber Jones, MD    Allergies    Patient has no known allergies.  Review of Systems   Review of Systems  Constitutional: Negative for fever.  Respiratory: Negative for cough and shortness of breath.   Cardiovascular: Positive for chest pain and palpitations.  Gastrointestinal: Negative for abdominal pain, nausea and vomiting.  Genitourinary: Negative for dysuria and hematuria.  Neurological: Positive for light-headedness. Negative for headaches.  All other systems reviewed and are negative.   Physical Exam Updated Vital Signs BP (!) 156/74 (BP Location: Right Arm)   Pulse 86   Temp 97.9 F (36.6 C) (Oral)   Resp 20   Ht 6\' 4"  (1.93 m)   Wt (!) 167.8 kg   SpO2 100%   BMI 45.04 kg/m   Physical Exam Vitals and nursing note reviewed.  Constitutional:      Appearance: Normal appearance. He is well-developed.  HENT:     Head: Normocephalic and atraumatic.  Eyes:     General: Lids are normal. No scleral icterus.       Right eye: No discharge.        Left eye: No discharge.     Conjunctiva/sclera: Conjunctivae normal.     Pupils: Pupils are equal, round, and reactive to light.  Cardiovascular:     Rate and Rhythm: Normal rate and regular rhythm.     Pulses: Normal pulses.          Radial pulses are 2+ on the right side and 2+ on the left side.       Dorsalis pedis pulses are 2+ on the right side and 2+ on the left side.     Heart sounds: Normal heart sounds. No murmur heard.  No friction rub. No gallop.   Pulmonary:     Effort: Pulmonary effort is normal.     Breath sounds: Normal breath sounds.     Comments: Lungs clear to auscultation bilaterally.  Symmetric chest  rise.  No wheezing, rales, rhonchi. Abdominal:     Palpations: Abdomen is soft. Abdomen is not rigid.     Tenderness: There is no abdominal tenderness. There is no guarding.  Musculoskeletal:        General: Normal range of motion.     Cervical back: Full passive range of motion without pain.  Skin:    General:  Skin is warm and dry.     Capillary Refill: Capillary refill takes less than 2 seconds.  Neurological:     Mental Status: He is alert and oriented to person, place, and time.  Psychiatric:        Speech: Speech normal.        Behavior: Behavior normal.     ED Results / Procedures / Treatments   Labs (all labs ordered are listed, but only abnormal results are displayed) Labs Reviewed  COMPREHENSIVE METABOLIC PANEL - Abnormal; Notable for the following components:      Result Value   Sodium 133 (*)    Glucose, Bld 155 (*)    ALT 49 (*)    All other components within normal limits  RESPIRATORY PANEL BY RT PCR (FLU A&B, COVID)  CBC WITH DIFFERENTIAL/PLATELET  TROPONIN I (HIGH SENSITIVITY)  TROPONIN I (HIGH SENSITIVITY)    EKG None   Sinus Tachy, rate 102. QTC 445  Radiology DG Chest 2 View  Result Date: 06/29/2020 CLINICAL DATA:  Chest pain and palpitations EXAM: CHEST - 2 VIEW COMPARISON:  05/04/2020 FINDINGS: The heart size and mediastinal contours are within normal limits. No focal airspace consolidation, pleural effusion, or pneumothorax. The visualized skeletal structures are unremarkable. IMPRESSION: No active cardiopulmonary disease. Electronically Signed   By: Davina Poke D.O.   On: 06/29/2020 14:13    Procedures Procedures (including critical care time)  Medications Ordered in ED Medications  sodium chloride 0.9 % bolus 1,000 mL ( Intravenous Stopped 06/29/20 1935)  LORazepam (ATIVAN) injection 0.5 mg (0.5 mg Intravenous Given 06/29/20 1739)  LORazepam (ATIVAN) injection 0.5 mg (0.5 mg Intravenous Given 06/29/20 1924)    ED Course  I have reviewed  the triage vital signs and the nursing notes.  Pertinent labs & imaging results that were available during my care of the patient were reviewed by me and considered in my medical decision making (see chart for details).    MDM Rules/Calculators/A&P                           34 year old male who presents for evaluation of episode of palpitations, chest pain, lightheadedness.  He reports that this occurred while in a meeting at work.  He did similar episode a few weeks ago and was seen in the ED at that time, his work-up was unremarkable.  He was followed up with his primary care doctor who advised him to follow-up with PCP.  He had a monitor, echo, stress test done that was unremarkable.  He states on the emergency department, he still feels some tingling, "almost like electrical" type feeling in his legs and his chest.  No difficulty breathing currently.  He states he was at a meeting while this happened.  Did not feel particularly stressed during the meeting but does report that his girlfriend was just recently diagnosed with Covid and he has been worried and concerned about that.  He does not smoke.  No history of cardio abnormalities.  No family history of MI before the age of 34.  He states that this was not with exertion.  Question if this is anxiety versus infectious process.  Also consider ACS etiology but low suspicion given some of the atypical features of it.  We will plan to check labs, EKG, chest x-ray.  History/physical exam not concerning for dissection, PE.  Troponin negative.  CBC shows leukocytosis, hemoglobin 15.4.  CMP shows sodium 133, glucose 155.  BUN and creatinine within normal limits. CXR negative for any acute abnormality.   Review of patient's records show that he was seen in the ED on 05/04/2020 for evaluation of chest pain after lifting a rebar.  At that time, his work-up was unremarkable and it was thought to be musculoskeletal.  He followed up with his primary care doctor  who advised him to see cardiology on an outpatient basis.  He saw cardiology on 05/10/2020 who recommended a Holter monitor, echo, stress test.   This follow-up appoint with cardiology was on 8/20/1.  They had not yet gotten the results of his Holter monitor.  He did have an echo which was unremarkable.  He had a stress test on May 17, 2020 which showed no evidence of ST segment deviation.  It was considered a normal study and a low risk study.  His EF was 55-65%.  He was not started on any medications during that follow-up with cardiology.  Covid is negative.  Delta troponin is negative.  Given patient's history/risk factors, he has a heart score of 3.  Given his recent reassuring stress test as well as cardiac work-up and reassuring EKG as well as 2 - troponins, feel that patient can be appropriately discharged.  He does report feeling somewhat better after the Ativan.  He is requesting some anxiety medication to go home with.  I discussed with him that he should contact his primary care doctor but will give a short course of Vistaril to help with acute anxiety.  I discussed with patient that he needs to follow-up with cardiology as he may need additional monitoring to capture any events. At this time, patient exhibits no emergent life-threatening condition that require further evaluation in ED. Patient had ample opportunity for questions and discussion. All patient's questions were answered with full understanding. Strict return precautions discussed. Patient expresses understanding and agreement to plan.   HARKIRAT OROZCO was evaluated in Emergency Department on 06/29/2020 for the symptoms described in the history of present illness. He was evaluated in the context of the global COVID-19 pandemic, which necessitated consideration that the patient might be at risk for infection with the SARS-CoV-2 virus that causes COVID-19. Institutional protocols and algorithms that pertain to the evaluation of patients at  risk for COVID-19 are in a state of rapid change based on information released by regulatory bodies including the CDC and federal and state organizations. These policies and algorithms were followed during the patient's care in the ED.  Portions of this note were generated with [employer]. Dictation errors may occur despite best attempts at proofreading.  Final Clinical Impression(s) / ED Diagnoses Final diagnoses:  Nonspecific chest pain  Palpitations    Rx / DC Orders ED Discharge Orders         Ordered    hydrOXYzine (ATARAX/VISTARIL) 25 MG tablet  Every 6 hours        06/29/20 1906           Desma Mcgregor 06/29/20 2353    Charlesetta Shanks, MD 07/09/20 1236

## 2020-06-29 NOTE — Discharge Instructions (Signed)
As we discussed, your work-up today was reassuring.  Your Covid test was negative.  Chest x-ray, blood work was reassuring.  As we discussed, there may be a component of anxiety to this.  I prescribed you some Vistaril.  As we discussed, you need to follow-up with your cardiology.  Please call our office with them that you were seen here in the emergency department.  They may need to do further monitoring.  Additionally, discussed with your internal medicine doctor regarding anxiety.  Your Covid test today was negative.  If you start developing symptoms, he will need a repeat test.  Return to the Emergency Department immediately if you experiencing worsening chest pain, difficulty breathing, nausea/vomiting, get very sweaty, headache or any other worsening or concerning symptoms.

## 2020-06-29 NOTE — Telephone Encounter (Signed)
I connected with  Travis [name] on 06/29/20 by a video enabled telemedicine application and verified that I am speaking with the correct person using two identifiers.   I discussed the limitations of evaluation and management by telemedicine. The patient expressed understanding and agreed to proceed.

## 2020-06-30 ENCOUNTER — Encounter (INDEPENDENT_AMBULATORY_CARE_PROVIDER_SITE_OTHER): Payer: Self-pay | Admitting: Family Medicine

## 2020-06-30 DIAGNOSIS — F410 Panic disorder [episodic paroxysmal anxiety] without agoraphobia: Secondary | ICD-10-CM | POA: Diagnosis not present

## 2020-06-30 NOTE — Progress Notes (Signed)
TeleHealth Visit:  Due to the COVID-19 pandemic, this visit was completed with telemedicine (audio/video) technology to reduce patient and provider exposure as well as to preserve personal protective equipment.   Travis [name] has verbally consented to this TeleHealth visit. The patient is located at home, the provider is located at the [facility] office. The participants in this visit include the listed provider and patient. The visit was conducted today via MyChart video.   Chief Complaint: OBESITY Travis [name] is here to discuss his progress with his obesity treatment plan along with follow-up of his obesity related diagnoses. Travis [name] is on the Category 4 Plan and states he is following his eating plan approximately 80% of the time. Travis [name] states he is active while working only.   Today's visit was #: 11 Starting weight: 399 lbs Starting date: 01/05/2020  Interim History: Travis [name] notes COVID exposure, and his girlfriend is ill with symptoms of COVID. She doesn't have symptoms currently. His girlfriend's birthday was last weekend so he did indulge over the weekend. He states he is struggling with dinner especially with vegetables. He was supposed to go to [place] tomorrow but that is canceled due to COVID exposure.  Subjective:   1. Vitamin D deficiency Travis [name] denies nausea, vomiting, or muscle weakness, but he notes fatigue. He is on Vit D prescription and his last Vit D level was 22.8.  2. Anxiety Travis [name] is on Prozac 20 mg, and she symptoms had initially improved.  Assessment/Plan:   1. Vitamin D deficiency Low Vitamin D level contributes to fatigue and are associated with obesity, breast, and colon cancer. We will refill prescription Vitamin D for 1 month. Travis [name] will follow-up for routine testing of Vitamin D, at least 2-3 times per year to avoid over-replacement.  - Vitamin D, Ergocalciferol, (DRISDOL) 1.25 MG (50000 UNIT) CAPS capsule; Take 1 capsule (50,000 Units  total) by mouth every 7 (seven) days.  Dispense: 4 capsule; Refill: 0  2. Anxiety Behavior modification techniques were discussed today to help Travis [name] deal with his anxiety. Travis [name] agreed to increase Prozac to 40 mg PO daily with no refill. Orders and follow up as documented in patient record.   - FLUoxetine (PROZAC) 40 MG capsule; Take 1 capsule (40 mg total) by mouth daily.  Dispense: 30 capsule; Refill: 0  3. Class 3 severe obesity with serious comorbidity and body mass index (BMI) of 40.0 to 44.9 in adult, unspecified obesity type Travis [name]) Travis [name] is currently in the action stage of change. As such, his goal is to continue with weight loss efforts. He has agreed to the Category 4 Plan + 200 calories.   Exercise goals: As is.  Behavioral modification strategies: increasing lean protein intake, meal planning and cooking strategies, keeping healthy foods in the home and planning for success.  Travis [name] has agreed to follow-up with our clinic in 2 weeks. He was informed of the importance of frequent follow-up visits to maximize his success with intensive lifestyle modifications for his multiple health conditions.  Objective:   VITALS: Per patient if applicable, see vitals. GENERAL: Alert and in no acute distress. CARDIOPULMONARY: No increased WOB. Speaking in clear sentences.  PSYCH: Pleasant and cooperative. Speech normal rate and rhythm. Affect is appropriate. Insight and judgement are appropriate. Attention is focused, linear, and appropriate.  NEURO: Oriented as arrived to appointment on time with no prompting.   Lab Results  Component Value Date   CREATININE 0.85 06/29/2020   BUN 15 06/29/2020   NA 133 (  L) 06/29/2020   K 3.5 06/29/2020   CL 98 06/29/2020   CO2 22 06/29/2020   Lab Results  Component Value Date   ALT 49 (H) 06/29/2020   AST 26 06/29/2020   ALKPHOS 46 06/29/2020   BILITOT 0.5 06/29/2020   Lab Results  Component Value Date   HGBA1C 5.5 01/05/2020   HGBA1C 5.6  07/29/2019   Lab Results  Component Value Date   INSULIN 15.6 01/31/2020   INSULIN CANCELED 01/05/2020   Lab Results  Component Value Date   TSH 1.580 01/31/2020   Lab Results  Component Value Date   CHOL 191 01/31/2020   HDL 50 01/31/2020   LDLCALC 129 (H) 01/31/2020   TRIG 63 01/31/2020   CHOLHDL 4 07/28/2019   Lab Results  Component Value Date   WBC 6.2 06/29/2020   HGB 15.4 06/29/2020   HCT 45.7 06/29/2020   MCV 83.1 06/29/2020   PLT 205 06/29/2020   No results found for: IRON, TIBC, FERRITIN  Attestation Statements:   Reviewed by clinician on day of visit: allergies, medications, problem list, medical history, surgical history, family history, social history, and previous encounter notes.  Time spent on visit including pre-visit chart review and post-visit charting and care was 18 minutes.    I, Trixie Dredge, am acting as transcriptionist for Coralie Common, MD.  I have reviewed the above documentation for accuracy and completeness, and I agree with the above. - Jinny Blossom, MD

## 2020-07-03 NOTE — Telephone Encounter (Signed)
FYI

## 2020-07-04 DIAGNOSIS — F410 Panic disorder [episodic paroxysmal anxiety] without agoraphobia: Secondary | ICD-10-CM | POA: Diagnosis not present

## 2020-07-11 ENCOUNTER — Ambulatory Visit: Payer: BC Managed Care – PPO | Admitting: Neurology

## 2020-07-13 ENCOUNTER — Encounter (INDEPENDENT_AMBULATORY_CARE_PROVIDER_SITE_OTHER): Payer: Self-pay | Admitting: Family Medicine

## 2020-07-13 ENCOUNTER — Other Ambulatory Visit: Payer: Self-pay

## 2020-07-13 ENCOUNTER — Ambulatory Visit (INDEPENDENT_AMBULATORY_CARE_PROVIDER_SITE_OTHER): Payer: BC Managed Care – PPO | Admitting: Family Medicine

## 2020-07-13 ENCOUNTER — Telehealth (INDEPENDENT_AMBULATORY_CARE_PROVIDER_SITE_OTHER): Payer: BC Managed Care – PPO | Admitting: Family Medicine

## 2020-07-13 DIAGNOSIS — E8881 Metabolic syndrome: Secondary | ICD-10-CM

## 2020-07-13 DIAGNOSIS — F419 Anxiety disorder, unspecified: Secondary | ICD-10-CM

## 2020-07-13 DIAGNOSIS — E88819 Insulin resistance, unspecified: Secondary | ICD-10-CM

## 2020-07-13 DIAGNOSIS — Z6841 Body Mass Index (BMI) 40.0 and over, adult: Secondary | ICD-10-CM | POA: Diagnosis not present

## 2020-07-13 DIAGNOSIS — E66813 Obesity, class 3: Secondary | ICD-10-CM

## 2020-07-13 MED ORDER — CLONAZEPAM 0.5 MG PO TABS: 0.5000 mg | ORAL_TABLET | Freq: Two times a day (BID) | ORAL | 0 refills | Status: DC | PRN

## 2020-07-13 MED ORDER — SERTRALINE HCL 100 MG PO TABS: 100.0000 mg | ORAL_TABLET | Freq: Every day | ORAL | 0 refills | Status: DC

## 2020-07-13 NOTE — Progress Notes (Signed)
TeleHealth Visit:  Due to the COVID-19 pandemic, this visit was completed with telemedicine (audio/video) technology to reduce patient and provider exposure as well as to preserve personal protective equipment.   Travis [name] has verbally consented to this TeleHealth visit. The patient is located at home, the provider is located at the [facility] office. The participants in this visit include the listed provider and patient. The visit was conducted today via MyChart video.   Chief Complaint: OBESITY Travis [name] is here to discuss his progress with his obesity treatment plan along with follow-up of his obesity related diagnoses. Travis [name] is on the Category 4 Plan + 200 calories and states he is following his eating plan approximately 60% of the time. Travis [name] states he is doing 0 minutes 0 times per week.  Today's visit was #: 12 Starting weight: 399 lbs Starting date: 01/05/2020  Interim History: Travis [name] had an episode a few weeks ago of anxiety that he went to the emergency department for. He is doing breakfast daily and every other day at 100% on the meal plan. He is able to eat even with anxiety but his appetite has decreased. He potentially plans to go to [place] next weekend.  Subjective:   1. Insulin resistance Brave notes minimal carbohydrate cravings, and minimal appetite. He is not on medications.  2. Anxiety Angle is on Zoloft 100 mg and Klonopin 0.50 mg BID. He is seeing a Teacher, music at [employer]. He switched from Prozac secondary to anxiety symptoms not well controlled.  Assessment/Plan:   1. Insulin resistance Travis [name] will continue to work on weight loss, exercise, and decreasing simple carbohydrates to help decrease the risk of diabetes. We will follow up on symptoms at his next appointment. Travis [name] agreed to follow-up with [name] as directed to closely monitor his progress.  2. Anxiety Behavior modification techniques were discussed today to help  Travis [name] deal with his anxiety. He will follow up with his Psychiatrist tomorrow. We will refill Klonopin and Zoloft for 1 month. Orders and follow up as documented in patient record.   - clonazePAM (KLONOPIN) 0.5 MG tablet; Take 1 tablet (0.5 mg total) by mouth 2 (two) times daily as needed for anxiety.  Dispense: 20 tablet; Refill: 0 - sertraline (ZOLOFT) 100 MG tablet; Take 1 tablet (100 mg total) by mouth daily.  Dispense: 30 tablet; Refill: 0  3. Class 3 severe obesity with serious comorbidity and body mass index (BMI) of 40.0 to 44.9 in adult, unspecified obesity type Travis [name]) Travis [name] is currently in the action stage of change. As such, his goal is to continue with weight loss efforts. He has agreed to the Category 4 Plan + 200 calories or practicing portion control and making smarter food choices, such as increasing vegetables and decreasing simple carbohydrates, and not go long periods without eating.   Exercise goals: All adults should avoid inactivity. Some physical activity is better than none, and adults who participate in any amount of physical activity gain some health benefits.  Behavioral modification strategies: increasing lean protein intake, meal planning and cooking strategies, keeping healthy foods in the home and planning for success.  Travis [name] has agreed to follow-up with our clinic in 3 weeks. He was informed of the importance of frequent follow-up visits to maximize his success with intensive lifestyle modifications for his multiple health conditions.  Objective:   VITALS: Per patient if applicable, see vitals. GENERAL: Alert and in no acute distress. CARDIOPULMONARY: No increased WOB. Speaking in clear sentences.  PSYCH: Pleasant and cooperative. Speech normal rate and rhythm. Affect is appropriate. Insight and judgement are appropriate. Attention is focused, linear, and appropriate.  NEURO: Oriented as arrived to appointment on time with no prompting.   Lab Results    Component Value Date   CREATININE 0.85 06/29/2020   BUN 15 06/29/2020   NA 133 (L) 06/29/2020   K 3.5 06/29/2020   CL 98 06/29/2020   CO2 22 06/29/2020   Lab Results  Component Value Date   ALT 49 (H) 06/29/2020   AST 26 06/29/2020   ALKPHOS 46 06/29/2020   BILITOT 0.5 06/29/2020   Lab Results  Component Value Date   HGBA1C 5.5 01/05/2020   HGBA1C 5.6 07/29/2019   Lab Results  Component Value Date   INSULIN 15.6 01/31/2020   INSULIN CANCELED 01/05/2020   Lab Results  Component Value Date   TSH 1.580 01/31/2020   Lab Results  Component Value Date   CHOL 191 01/31/2020   HDL 50 01/31/2020   LDLCALC 129 (H) 01/31/2020   TRIG 63 01/31/2020   CHOLHDL 4 07/28/2019   Lab Results  Component Value Date   WBC 6.2 06/29/2020   HGB 15.4 06/29/2020   HCT 45.7 06/29/2020   MCV 83.1 06/29/2020   PLT 205 06/29/2020   No results found for: IRON, TIBC, FERRITIN  Attestation Statements:   Reviewed by clinician on day of visit: allergies, medications, problem list, medical history, surgical history, family history, social history, and previous encounter notes.   I, Trixie Dredge, am acting as transcriptionist for Coralie Common, MD.  I have reviewed the above documentation for accuracy and completeness, and I agree with the above. - Jinny Blossom, MD

## 2020-07-14 DIAGNOSIS — F410 Panic disorder [episodic paroxysmal anxiety] without agoraphobia: Secondary | ICD-10-CM | POA: Diagnosis not present

## 2020-07-24 DIAGNOSIS — F410 Panic disorder [episodic paroxysmal anxiety] without agoraphobia: Secondary | ICD-10-CM | POA: Diagnosis not present

## 2020-08-02 ENCOUNTER — Ambulatory Visit (INDEPENDENT_AMBULATORY_CARE_PROVIDER_SITE_OTHER): Payer: BC Managed Care – PPO | Admitting: Family Medicine

## 2020-08-02 ENCOUNTER — Encounter: Payer: Self-pay | Admitting: Family Medicine

## 2020-08-02 ENCOUNTER — Other Ambulatory Visit: Payer: Self-pay

## 2020-08-02 VITALS — BP 122/82 | HR 85 | Temp 98.0°F | Ht 76.0 in | Wt 387.1 lb

## 2020-08-02 DIAGNOSIS — Z1159 Encounter for screening for other viral diseases: Secondary | ICD-10-CM | POA: Diagnosis not present

## 2020-08-02 DIAGNOSIS — Z23 Encounter for immunization: Secondary | ICD-10-CM

## 2020-08-02 DIAGNOSIS — Z0000 Encounter for general adult medical examination without abnormal findings: Secondary | ICD-10-CM

## 2020-08-02 DIAGNOSIS — E559 Vitamin D deficiency, unspecified: Secondary | ICD-10-CM | POA: Diagnosis not present

## 2020-08-02 DIAGNOSIS — N5089 Other specified disorders of the male genital organs: Secondary | ICD-10-CM | POA: Diagnosis not present

## 2020-08-02 NOTE — Patient Instructions (Addendum)
Give [name] 2-3 business days to get the results of your labs back.   Keep the diet clean and stay active.  Do monthly self testicular checks in the shower. You are feeling for lumps/bumps that don't belong. If you feel anything like this, let me know and I will order another ultrasound. These are likely more cysts.  Let us know if you need anything.

## 2020-08-02 NOTE — Progress Notes (Signed)
Chief Complaint  Patient presents with  . Annual Exam    Well Male Travis [name] is here for a complete physical.   His last physical was >1 year ago.  Current diet: in general, diet has been OK.   Current exercise: active at work, lifting, cardio Weight trend: had been decreasing, gained some back Fatigue out of ordinary? No. Seat belt? Yes.    Pt has a several mo hx of nodules on his L testicle. Has a hx of cysts on his testicle that was evaluated by [name]. These are new. No pain. Urination is sound.    Health maintenance Tetanus- Getting today HIV- Yes Hep C- No  Past Medical History:  Diagnosis Date  . ADD (attention deficit disorder)   . Asthma    childhood  . Asthma   . Colon polyps   . Hip pain   . HTN (hypertension)   . Joint pain   . Overweight      Past Surgical History:  Procedure Laterality Date  . arm Right   . TONSILLECTOMY    . WISDOM TOOTH EXTRACTION      Medications  Current Outpatient Medications on File Prior to Visit  Medication Sig Dispense Refill  . clonazePAM (KLONOPIN) 0.5 MG tablet Take 1 tablet (0.5 mg total) by mouth 2 (two) times daily as needed for anxiety. 20 tablet 0  . mirtazapine (REMERON) 15 MG tablet Take 15 mg by mouth at bedtime.    Marland Kitchen venlafaxine XR (EFFEXOR-XR) 75 MG 24 hr capsule Take 2 capsules by mouth daily.    . Vitamin D, Ergocalciferol, (DRISDOL) 1.25 MG (50000 UNIT) CAPS capsule Take 1 capsule (50,000 Units total) by mouth every 7 (seven) days. 4 capsule 0   Allergies No Known Allergies  Family History Family History  Problem Relation Age of Onset  . Obesity Mother   . Mental illness Mother   . Depression Mother   . Bipolar disorder Mother   . Sleep apnea Mother   . Alcoholism Mother   . Eating disorder Mother   . Irritable bowel syndrome Sister   . Hearing loss Maternal Grandmother     Review of Systems: Constitutional: no fevers or chills Eye:  no recent significant change in  vision Ear/Nose/Mouth/Throat:  Ears:  no hearing loss Nose/Mouth/Throat:  no complaints of nasal congestion, no sore throat Cardiovascular:  no chest pain Respiratory:  no shortness of breath Gastrointestinal:  no abdominal pain, no change in bowel habits GU:  Male: negative for dysuria Musculoskeletal/Extremities:  no pain of the joints Integumentary (Skin/Breast):  no abnormal skin lesions reported  Neurologic:  no headaches Endocrine: No unexpected weight changes Hematologic/Lymphatic:  no night sweats  Exam BP 122/82 (BP Location: Right Arm, Patient Position: Sitting, Cuff Size: Large)   Pulse 85   Temp 98 F (36.7 C) (Oral)   Ht 6\' 4"  (1.93 m)   Wt (!) 387 lb 2 oz (175.6 kg)   SpO2 96%   BMI 47.12 kg/m  General:  well developed, well nourished, in no apparent distress Skin:  no significant moles, warts, or growths Head:  no masses, lesions, or tenderness Eyes:  pupils equal and round, sclera anicteric without injection Ears:  canals without lesions, TMs shiny without retraction, no obvious effusion, no erythema Nose:  nares patent, septum midline, mucosa normal Throat/Pharynx:  lips and gingiva without lesion; tongue and uvula midline; non-inflamed pharynx; no exudates or postnasal drainage Neck: neck supple without adenopathy, thyromegaly, or masses Lungs:  clear to auscultation, breath sounds equal bilaterally, no respiratory distress Cardio:  regular rate and rhythm, no bruits, no LE edema Abdomen:  abdomen soft, nontender; bowel sounds normal; no masses or organomegaly Genital (male): I did not feel any lesions or nodules on his testes. No skin lesions.  Rectal: Deferred Musculoskeletal:  symmetrical muscle groups noted without atrophy or deformity Extremities:  no clubbing, cyanosis, or edema, no deformities, no skin discoloration Neuro:  gait normal; deep tendon reflexes normal and symmetric Psych: well oriented with normal range of affect and appropriate  judgment/insight  Assessment and Plan  Well adult exam - Plan: Lipid panel, Comprehensive metabolic panel  Encounter for hepatitis C screening test for low risk patient - Plan: Hepatitis C antibody  Vitamin D deficiency - Plan: VITAMIN D 25 Hydroxy (Vit-D Deficiency, Fractures)  Testicle lump - Plan: US Scrotum  Need for Tdap vaccination - Plan: Tdap vaccine greater than or equal to 65yo IM   Well 34 y.o. male. Counseled on diet and exercise. Self testicular exams recommended at least monthly. Pt felt a lump on his testicle, will pursue w [name]. Likely cysts, but these are new and he is a young male.  Other orders as above. Follow up in 1 year pending the above workup. The patient voiced understanding and agreement to the plan.  [place], DO 08/02/20 10:22 AM

## 2020-08-03 ENCOUNTER — Other Ambulatory Visit: Payer: Self-pay | Admitting: Family Medicine

## 2020-08-03 DIAGNOSIS — R7401 Elevation of levels of liver transaminase levels: Secondary | ICD-10-CM

## 2020-08-03 LAB — LIPID PANEL
Cholesterol: 200 mg/dL — ABNORMAL HIGH (ref <200)
HDL: 63 mg/dL (ref >=40)
LDL Cholesterol (Calc): 122 mg/dL (calc) — ABNORMAL HIGH
Non-HDL Cholesterol (Calc): 137 mg/dL (calc) — ABNORMAL HIGH (ref <130)
Total CHOL/HDL Ratio: 3.2 (calc) (ref <5.0)
Triglycerides: 63 mg/dL (ref <150)

## 2020-08-03 LAB — COMPREHENSIVE METABOLIC PANEL
AG Ratio: 1.6 (calc) (ref 1.0–2.5)
ALT: 52 U/L — ABNORMAL HIGH (ref 9–46)
AST: 26 U/L (ref 10–40)
Albumin: 4.4 g/dL (ref 3.6–5.1)
Alkaline phosphatase (APISO): 49 U/L (ref 36–130)
BUN: 16 mg/dL (ref 7–25)
CO2: 22 mmol/L (ref 20–32)
Calcium: 9.8 mg/dL (ref 8.6–10.3)
Chloride: 102 mmol/L (ref 98–110)
Creat: 0.73 mg/dL (ref 0.60–1.35)
Globulin: 2.7 g/dL (calc) (ref 1.9–3.7)
Glucose, Bld: 89 mg/dL (ref 65–99)
Potassium: 4.6 mmol/L (ref 3.5–5.3)
Sodium: 137 mmol/L (ref 135–146)
Total Bilirubin: 0.3 mg/dL (ref 0.2–1.2)
Total Protein: 7.1 g/dL (ref 6.1–8.1)

## 2020-08-03 LAB — HEPATITIS C ANTIBODY
Hepatitis C Ab: NONREACTIVE
SIGNAL TO CUT-OFF: 0.01 (ref <1.00)

## 2020-08-03 LAB — VITAMIN D 25 HYDROXY (VIT D DEFICIENCY, FRACTURES): Vit D, 25-Hydroxy: 25 ng/mL — ABNORMAL LOW (ref 30–100)

## 2020-08-07 ENCOUNTER — Encounter (INDEPENDENT_AMBULATORY_CARE_PROVIDER_SITE_OTHER): Payer: Self-pay | Admitting: Family Medicine

## 2020-08-07 ENCOUNTER — Encounter (INDEPENDENT_AMBULATORY_CARE_PROVIDER_SITE_OTHER): Payer: Self-pay

## 2020-08-07 ENCOUNTER — Other Ambulatory Visit: Payer: Self-pay

## 2020-08-07 ENCOUNTER — Telehealth (INDEPENDENT_AMBULATORY_CARE_PROVIDER_SITE_OTHER): Payer: BC Managed Care – PPO | Admitting: Family Medicine

## 2020-08-07 DIAGNOSIS — E7849 Other hyperlipidemia: Secondary | ICD-10-CM

## 2020-08-07 DIAGNOSIS — E559 Vitamin D deficiency, unspecified: Secondary | ICD-10-CM

## 2020-08-07 DIAGNOSIS — Z6841 Body Mass Index (BMI) 40.0 and over, adult: Secondary | ICD-10-CM

## 2020-08-07 DIAGNOSIS — F410 Panic disorder [episodic paroxysmal anxiety] without agoraphobia: Secondary | ICD-10-CM | POA: Diagnosis not present

## 2020-08-07 MED ORDER — VITAMIN D (ERGOCALCIFEROL) 1.25 MG (50000 UNIT) PO CAPS: 50000.0000 [IU] | ORAL_CAPSULE | ORAL | 0 refills | Status: DC

## 2020-08-08 ENCOUNTER — Other Ambulatory Visit (INDEPENDENT_AMBULATORY_CARE_PROVIDER_SITE_OTHER): Payer: Self-pay | Admitting: Family Medicine

## 2020-08-08 DIAGNOSIS — E559 Vitamin D deficiency, unspecified: Secondary | ICD-10-CM

## 2020-08-08 NOTE — Progress Notes (Signed)
TeleHealth Visit:  Due to the COVID-19 pandemic, this visit was completed with telemedicine (audio/video) technology to reduce patient and provider exposure as well as to preserve personal protective equipment.   Travis [name] has verbally consented to this TeleHealth visit. The patient is located at home, the provider is located at the [facility] office. The participants in this visit include the listed provider and patient. The visit was conducted today via MyChart video.   Chief Complaint: OBESITY Travis [name] is here to discuss his progress with his obesity treatment plan along with follow-up of his obesity related diagnoses. Travis [name] is on the Category 4 Plan + 200 calories, or practicing portion control and making smarter food choices, such as increasing vegetables and decreasing simple carbohydrates and states he is following his eating plan approximately 30% of the time. Travis [name] states he is active while at work only.   Today's visit was #: 13 Starting weight: 399 lbs Starting date: 01/05/2020  Interim History: Travis [name] has been working with his Psychiatrist and was recently put on mirtazipine. He feels that the program is causing some anxiety as he is trying to figure out what is going on. He feels the program is just an additional stressor. He has gained 8-9 lbs since starting.  Subjective:   1. Vitamin D deficiency Travis [name] denies nausea, vomiting, or muscle weakness, but notes fatigue. He is on prescription Vit D. Last vit D level was of 25.  2. Other hyperlipidemia Travis [name]'s last LDL was 122, HDL 63, and triglycerides 63. He is not on medications.   Assessment/Plan:   1. Vitamin D deficiency Low Vitamin D level contributes to fatigue and are associated with obesity, breast, and colon cancer. We will refill prescription Vitamin D for 1 month. Travis [name] will follow-up for routine testing of Vitamin D, at least 2-3 times per year to avoid over-replacement.  - Vitamin D,  Ergocalciferol, (DRISDOL) 1.25 MG (50000 UNIT) CAPS capsule; Take 1 capsule (50,000 Units total) by mouth every 7 (seven) days.  Dispense: 12 capsule; Refill: 0  2. Other hyperlipidemia Cardiovascular risk and specific lipid/LDL goals reviewed. We discussed several lifestyle modifications today and Travis [name] will continue to work on diet, exercise and weight loss efforts. We will repeat labs in February 2022. Orders and follow up as documented in patient record.   Counseling Intensive lifestyle modifications are the first line treatment for this issue. . Dietary changes: Increase soluble fiber. Decrease simple carbohydrates. . Exercise changes: Moderate to vigorous-intensity aerobic activity 150 minutes per week if tolerated. . Lipid-lowering medications: see documented in medical record.  3. Class 3 severe obesity with serious comorbidity and body mass index (BMI) of 40.0 to 44.9 in adult, unspecified obesity type [facility]) Travis [name] is currently in the action stage of change. As such, his goal is to continue with weight loss efforts. He has agreed to practicing portion control and making smarter food choices, such as increasing vegetables and decreasing simple carbohydrates.   Exercise goals: As is.  Behavioral modification strategies: increasing lean protein intake, meal planning and cooking strategies and planning for success.  Travis [name] has agreed to follow-up with our clinic in 9 weeks. He was informed of the importance of frequent follow-up visits to maximize his success with intensive lifestyle modifications for his multiple health conditions.  Objective:   VITALS: Per patient if applicable, see vitals. GENERAL: Alert and in no acute distress. CARDIOPULMONARY: No increased WOB. Speaking in clear sentences.  PSYCH: Pleasant and cooperative. Speech normal rate and rhythm.  Affect is appropriate. Insight and judgement are appropriate. Attention is focused, linear, and appropriate.  NEURO:  Oriented as arrived to appointment on time with no prompting.   Lab Results  Component Value Date   CREATININE 0.73 08/02/2020   BUN 16 08/02/2020   NA 137 08/02/2020   K 4.6 08/02/2020   CL 102 08/02/2020   CO2 22 08/02/2020   Lab Results  Component Value Date   ALT 52 (H) 08/02/2020   AST 26 08/02/2020   ALKPHOS 46 06/29/2020   BILITOT 0.3 08/02/2020   Lab Results  Component Value Date   HGBA1C 5.5 01/05/2020   HGBA1C 5.6 07/29/2019   Lab Results  Component Value Date   INSULIN 15.6 01/31/2020   INSULIN CANCELED 01/05/2020   Lab Results  Component Value Date   TSH 1.580 01/31/2020   Lab Results  Component Value Date   CHOL 200 (H) 08/02/2020   HDL 63 08/02/2020   LDLCALC 122 (H) 08/02/2020   TRIG 63 08/02/2020   CHOLHDL 3.2 08/02/2020   Lab Results  Component Value Date   WBC 6.2 06/29/2020   HGB 15.4 06/29/2020   HCT 45.7 06/29/2020   MCV 83.1 06/29/2020   PLT 205 06/29/2020   No results found for: IRON, TIBC, FERRITIN  Attestation Statements:   Reviewed by clinician on day of visit: allergies, medications, problem list, medical history, surgical history, family history, social history, and previous encounter notes.   I, Trixie Dredge, am acting as transcriptionist for Coralie Common, MD.  I have reviewed the above documentation for accuracy and completeness, and I agree with the above. - Jinny Blossom, MD

## 2020-08-09 ENCOUNTER — Other Ambulatory Visit: Payer: Self-pay

## 2020-08-09 ENCOUNTER — Ambulatory Visit (HOSPITAL_BASED_OUTPATIENT_CLINIC_OR_DEPARTMENT_OTHER): Admission: RE | Admit: 2020-08-09 | Payer: BC Managed Care – PPO | Source: Ambulatory Visit

## 2020-08-09 ENCOUNTER — Other Ambulatory Visit: Payer: Self-pay | Admitting: Family Medicine

## 2020-08-09 ENCOUNTER — Ambulatory Visit (HOSPITAL_BASED_OUTPATIENT_CLINIC_OR_DEPARTMENT_OTHER)
Admission: RE | Admit: 2020-08-09 | Discharge: 2020-08-09 | Disposition: A | Discharge status: Home / Self Care | Payer: BC Managed Care – PPO | Source: Ambulatory Visit | Attending: Family Medicine | Admitting: Family Medicine

## 2020-08-09 DIAGNOSIS — N5089 Other specified disorders of the male genital organs: Secondary | ICD-10-CM | POA: Diagnosis not present

## 2020-08-14 ENCOUNTER — Ambulatory Visit (HOSPITAL_BASED_OUTPATIENT_CLINIC_OR_DEPARTMENT_OTHER): Admission: RE | Admit: 2020-08-14 | Payer: BC Managed Care – PPO | Source: Ambulatory Visit

## 2020-08-17 DIAGNOSIS — J45909 Unspecified asthma, uncomplicated: Secondary | ICD-10-CM | POA: Insufficient documentation

## 2020-08-17 DIAGNOSIS — M2550 Pain in unspecified joint: Secondary | ICD-10-CM | POA: Insufficient documentation

## 2020-08-17 DIAGNOSIS — I10 Essential (primary) hypertension: Secondary | ICD-10-CM | POA: Insufficient documentation

## 2020-08-17 DIAGNOSIS — M25559 Pain in unspecified hip: Secondary | ICD-10-CM | POA: Insufficient documentation

## 2020-08-17 DIAGNOSIS — F988 Other specified behavioral and emotional disorders with onset usually occurring in childhood and adolescence: Secondary | ICD-10-CM | POA: Insufficient documentation

## 2020-08-17 DIAGNOSIS — K635 Polyp of colon: Secondary | ICD-10-CM | POA: Insufficient documentation

## 2020-08-17 DIAGNOSIS — E663 Overweight: Secondary | ICD-10-CM | POA: Insufficient documentation

## 2020-08-21 DIAGNOSIS — F410 Panic disorder [episodic paroxysmal anxiety] without agoraphobia: Secondary | ICD-10-CM | POA: Diagnosis not present

## 2020-08-24 NOTE — Progress Notes (Signed)
Cardiology Office Note:    Date:  08/25/2020   ID:  Travis [name], DOB [birth year], MRN [id number]  PCP:  Shelda Pal, DO  Cardiologist:  Shirlee More, MD    Referring MD: Shelda Pal*    ASSESSMENT:    1. Chest pain of uncertain etiology   2. Attention deficit disorder, unspecified hyperactivity presence   3. Shortness of breath   4. Mixed hyperlipidemia    PLAN:    In order of problems listed above:  1. I nice opportunity to review the results of his testing with him all normal perfusion study echo event monitor.  He is seeing a psychiatrist adjustment medications he is doing well with less episodes of rapid heartbeat and stress.  He requests referral to behavioral health.   Next appointment: I will see back in the future as needed   Medication Adjustments/Labs and Tests Ordered: Current medicines are reviewed at length with the patient today.  Concerns regarding medicines are outlined above.  Orders Placed This Encounter  Procedures  . Ambulatory referral to [place]   No orders of the defined types were placed in this encounter.   Chief Complaint  Patient presents with  . Follow-up    After multiple cardiac test    History of Present Illness:    Travis [name] is a 34 y.o. male with a hx of palpitation chest pain and shortness of breath last seen 05/10/2020.  Compliance with diet, lifestyle and medications: Yes  Following his visit he underwent further evaluation including myocardial perfusion test reported 05/17/2020 with no evidence of ischemia normal perfusion and function EF 56%.  Echocardiogram showed normal left ventricular size function diastolic filling pressure and no significant valvular abnormality.  He utilized a ZIO monitor for 14 days which showed rare ventricular and supraventricular ectopy and no episodes of atrial fibrillation or flutter.  His triggered and diary events were associated with sinus rhythm and sinus  tachycardia. Past Medical History:  Diagnosis Date  . ADD (attention deficit disorder)   . Asthma    childhood  . Asthma   . Chest pain at rest 05/17/2020  . Class 3 severe obesity with serious comorbidity and body mass index (BMI) of 45.0 to 49.9 in adult [facility]) 07/28/2019  . Colon polyps   . Essential hypertension 03/20/2020  . Hip pain   . HTN (hypertension)   . Insulin resistance 05/25/2020  . Intermittent palpitations 05/17/2020  . Joint pain   . Left hip pain 07/28/2019  . Morbid obesity ([place]) 05/17/2020  . Near syncope 05/17/2020  . Other insomnia 04/03/2020  . Overweight   . Restless legs syndrome (RLS) 05/17/2020  . Snoring 05/17/2020  . Vitamin D deficiency 03/20/2020    Past Surgical History:  Procedure Laterality Date  . arm Right   . TONSILLECTOMY    . WISDOM TOOTH EXTRACTION      Current Medications: Current Meds  Medication Sig  . clonazePAM (KLONOPIN) 0.5 MG tablet Take 1 tablet (0.5 mg total) by mouth 2 (two) times daily as needed for anxiety.  . traZODone (DESYREL) 50 MG tablet Take 50 mg by mouth. 1-2 TABLETS AT BEDTIME  . venlafaxine XR (EFFEXOR-XR) 150 MG 24 hr capsule Take 150 mg by mouth daily.  Marland Kitchen venlafaxine XR (EFFEXOR-XR) 75 MG 24 hr capsule Take 1 capsule by mouth daily.   . Vitamin D, Ergocalciferol, (DRISDOL) 1.25 MG (50000 UNIT) CAPS capsule Take 1 capsule (50,000 Units total) by mouth every 7 (seven)  days.     Allergies:   Patient has no known allergies.   Social History   Socioeconomic History  . Marital status: Married    Spouse name: Not on file  . Number of children: Not on file  . Years of education: Not on file  . Highest education level: Not on file  Occupational History  . Not on file  Tobacco Use  . Smoking status: Never Smoker  . Smokeless tobacco: Never Used  Substance and Sexual Activity  . Alcohol use: Yes    Comment: on weekends  . Drug use: No  . Sexual activity: Not on file  Other Topics Concern  . Not on file    Social History Narrative  . Not on file   Social Determinants of Health   Financial Resource Strain:   . Difficulty of Paying Living Expenses: Not on file  Food Insecurity:   . Worried About Charity fundraiser in the Last Year: Not on file  . Ran Out of Food in the Last Year: Not on file  Transportation Needs:   . Lack of Transportation (Medical): Not on file  . Lack of Transportation (Non-Medical): Not on file  Physical Activity:   . Days of Exercise per Week: Not on file  . Minutes of Exercise per Session: Not on file  Stress:   . Feeling of Stress : Not on file  Social Connections:   . Frequency of Communication with Friends and Family: Not on file  . Frequency of Social Gatherings with Friends and Family: Not on file  . Attends Religious Services: Not on file  . Active Member of Clubs or Organizations: Not on file  . Attends Archivist Meetings: Not on file  . Marital Status: Not on file     Family History: The patient's family history includes Alcoholism in his mother; Bipolar disorder in his mother; Depression in his mother; Eating disorder in his mother; Hearing loss in his maternal grandmother; Irritable bowel syndrome in his sister; Mental illness in his mother; Obesity in his mother; Sleep apnea in his mother. ROS:   Please see the history of present illness.    All other systems reviewed and are negative.  EKGs/Labs/Other Studies Reviewed:    The following studies were reviewed today:   Recent Labs: 01/31/2020: TSH 1.580 06/29/2020: Hemoglobin 15.4; Platelets 205 08/02/2020: ALT 52; BUN 16; Creat 0.73; Potassium 4.6; Sodium 137  Recent Lipid Panel    Component Value Date/Time   CHOL 200 (H) 08/02/2020 1010   CHOL 191 01/31/2020 1521   TRIG 63 08/02/2020 1010   HDL 63 08/02/2020 1010   HDL 50 01/31/2020 1521   CHOLHDL 3.2 08/02/2020 1010   VLDL 11.8 07/28/2019 1012   LDLCALC 122 (H) 08/02/2020 1010    Physical Exam:    VS:  BP 132/90    Pulse 82   Ht 6\' 5"  (1.956 m)   Wt (!) 405 lb 1.3 oz (183.7 kg)   SpO2 94%   BMI 48.04 kg/m     Wt Readings from Last 3 Encounters:  08/25/20 (!) 405 lb 1.3 oz (183.7 kg)  08/02/20 (!) 387 lb 2 oz (175.6 kg)  06/29/20 (!) 370 lb (167.8 kg)     GEN:  Well nourished, well developed in no acute distress HEENT: Normal NECK: No JVD; No carotid bruits LYMPHATICS: No lymphadenopathy CARDIAC: RRR, no murmurs, rubs, gallops RESPIRATORY:  Clear to auscultation without rales, wheezing or rhonchi  ABDOMEN: Soft, non-tender,  non-distended MUSCULOSKELETAL:  No edema; No deformity  SKIN: Warm and dry NEUROLOGIC:  Alert and oriented x 3 PSYCHIATRIC:  Normal affect    Signed, Shirlee More, MD  08/25/2020 3:24 PM    [facility]

## 2020-08-25 ENCOUNTER — Encounter: Payer: Self-pay | Admitting: Cardiology

## 2020-08-25 ENCOUNTER — Ambulatory Visit (INDEPENDENT_AMBULATORY_CARE_PROVIDER_SITE_OTHER): Payer: BC Managed Care – PPO | Admitting: Cardiology

## 2020-08-25 ENCOUNTER — Other Ambulatory Visit: Payer: Self-pay

## 2020-08-25 VITALS — BP 132/90 | HR 82 | Ht 77.0 in | Wt >= 6400 oz

## 2020-08-25 DIAGNOSIS — R0602 Shortness of breath: Secondary | ICD-10-CM

## 2020-08-25 DIAGNOSIS — F988 Other specified behavioral and emotional disorders with onset usually occurring in childhood and adolescence: Secondary | ICD-10-CM

## 2020-08-25 DIAGNOSIS — R079 Chest pain, unspecified: Secondary | ICD-10-CM

## 2020-08-25 DIAGNOSIS — E782 Mixed hyperlipidemia: Secondary | ICD-10-CM | POA: Diagnosis not present

## 2020-08-25 NOTE — Patient Instructions (Signed)

## 2020-08-28 ENCOUNTER — Ambulatory Visit (INDEPENDENT_AMBULATORY_CARE_PROVIDER_SITE_OTHER): Payer: BC Managed Care – PPO | Admitting: Family Medicine

## 2020-09-07 ENCOUNTER — Encounter: Payer: Self-pay | Admitting: Family Medicine

## 2020-09-07 ENCOUNTER — Ambulatory Visit (INDEPENDENT_AMBULATORY_CARE_PROVIDER_SITE_OTHER): Payer: BC Managed Care – PPO | Admitting: Family Medicine

## 2020-09-07 VITALS — BP 132/78 | HR 81 | Temp 97.5°F | Wt >= 6400 oz

## 2020-09-07 DIAGNOSIS — N522 Drug-induced erectile dysfunction: Secondary | ICD-10-CM | POA: Diagnosis not present

## 2020-09-07 MED ORDER — SILDENAFIL CITRATE 100 MG PO TABS: 50.0000 mg | ORAL_TABLET | Freq: Every day | ORAL | 2 refills | Status: DC | PRN

## 2020-09-07 NOTE — Patient Instructions (Signed)
This could cause headaches.  Run this through [employer] Travis [name] has good prices.  Let us know if you need anything.

## 2020-09-07 NOTE — Progress Notes (Signed)
Chief Complaint  Patient presents with  . Follow-up    Subjective: Patient is a 34 y.o. male here for ED issues.  Pt dealing w anxiety, seeing psych. He was placed on SSRIs and most recently Effexor XR 225 mg/d. He has issues attaining an erection sufficient for intercourse since starting the initial SSRI, but it has continued. Asked cardiology for medicine, they rec'd he follow up here.   Past Medical History:  Diagnosis Date  . ADD (attention deficit disorder)   . Asthma    childhood  . Asthma   . Chest pain at rest 05/17/2020  . Class 3 severe obesity with serious comorbidity and body mass index (BMI) of 45.0 to 49.9 in adult [facility]) 07/28/2019  . Colon polyps   . Essential hypertension 03/20/2020  . Hip pain   . HTN (hypertension)   . Insulin resistance 05/25/2020  . Intermittent palpitations 05/17/2020  . Joint pain   . Left hip pain 07/28/2019  . Morbid obesity ([place]) 05/17/2020  . Near syncope 05/17/2020  . Other insomnia 04/03/2020  . Overweight   . Restless legs syndrome (RLS) 05/17/2020  . Snoring 05/17/2020  . Vitamin D deficiency 03/20/2020    Objective: BP 132/78 (BP Location: Right Arm, Patient Position: Sitting, Cuff Size: Large)   Pulse 81   Temp (!) 97.5 F (36.4 C) (Temporal)   Wt (!) 403 lb 12.8 oz (183.2 kg)   SpO2 96%   BMI 47.88 kg/m  General: Awake, appears stated age Lungs: No accessory muscle use Psych: Age appropriate judgment and insight, normal affect and mood  Assessment and Plan: Drug-induced erectile dysfunction - Plan: sildenafil (VIAGRA) 100 MG tablet  Above printed, will use GoodRx. Warned about priapism. Take 1/2 tab first. If he stops SNRI, he will let us know.  The patient voiced understanding and agreement to the plan.  [place], DO 09/07/20  9:40 AM

## 2020-09-12 ENCOUNTER — Ambulatory Visit (INDEPENDENT_AMBULATORY_CARE_PROVIDER_SITE_OTHER): Payer: BC Managed Care – PPO | Admitting: Licensed Clinical Social Worker

## 2020-09-12 ENCOUNTER — Ambulatory Visit (HOSPITAL_BASED_OUTPATIENT_CLINIC_OR_DEPARTMENT_OTHER)
Admission: RE | Admit: 2020-09-12 | Discharge: 2020-09-12 | Disposition: A | Discharge status: Home / Self Care | Payer: BC Managed Care – PPO | Source: Ambulatory Visit | Attending: Family Medicine | Admitting: Family Medicine

## 2020-09-12 ENCOUNTER — Other Ambulatory Visit: Payer: Self-pay

## 2020-09-12 DIAGNOSIS — F410 Panic disorder [episodic paroxysmal anxiety] without agoraphobia: Secondary | ICD-10-CM

## 2020-09-12 DIAGNOSIS — R7989 Other specified abnormal findings of blood chemistry: Secondary | ICD-10-CM | POA: Diagnosis not present

## 2020-09-12 DIAGNOSIS — R7401 Elevation of levels of liver transaminase levels: Secondary | ICD-10-CM | POA: Insufficient documentation
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
## 2020-09-18 DIAGNOSIS — F410 Panic disorder [episodic paroxysmal anxiety] without agoraphobia: Secondary | ICD-10-CM | POA: Diagnosis not present

## 2020-09-19 ENCOUNTER — Ambulatory Visit (INDEPENDENT_AMBULATORY_CARE_PROVIDER_SITE_OTHER): Payer: BC Managed Care – PPO | Admitting: Licensed Clinical Social Worker

## 2020-09-19 ENCOUNTER — Other Ambulatory Visit: Payer: Self-pay

## 2020-09-19 DIAGNOSIS — F410 Panic disorder [episodic paroxysmal anxiety] without agoraphobia: Secondary | ICD-10-CM | POA: Diagnosis not present

## 2020-09-19 NOTE — Progress Notes (Signed)
Virtual Visit via Video Note   I connected with Travis [name] on 09/19/20 at 1:00pm by video enabled telemedicine application and verified that I am speaking with the correct person using two identifiers.   I discussed the limitations, risks, security and privacy concerns of performing an evaluation and management service by video and the availability of in person appointments. I also discussed with the patient that there may be a patient responsible charge related to this service. The patient expressed understanding and agreed to proceed.   I discussed the assessment and treatment plan with the patient. The patient was provided an opportunity to ask questions and all were answered. The patient agreed with the plan and demonstrated an understanding of the instructions.   The patient was advised to call back or seek an in-person evaluation if the symptoms worsen or if the condition fails to improve as anticipated.   I provided 1 hour of non-face-to-face time during this encounter.     Travis Flood, LCSW, LCAS __________________________ THERAPIST PROGRESS NOTE   Session Time: 1:00pm - 2:00pm  Location: Patient: Patient home Therapist: Shallowater [name]   Participation Level: Active   Behavioral Response: Alert, casually dressed, neatly groomed, anxious mood/affect   Type of Therapy: Individual Therapy    Treatment Goals addressed: Coping with anxiety and reducing panic attacks; Psychiatry followup; Medication Compliance; Journaling regularly; Working to improve relationship with girlfriend for support    Interventions: CBT, 5-4-3-2-1 grounding    Summary: Travis [name] is a 34 year old male mixed race male that presented today for virtual therapy appointment with diagnoses of Panic Disorder.       Suicidal/Homicidal: None; without intent or plan.    Therapist Response:  Clinician met with Travis [name] for virtual therapy session and assessed for safety, sobriety and medication compliance.   Travis [name] presented for today's appointment on time and was alert, oriented x5, with no evidence or self-report of SI/HI or A/V H.  Travis [name] reported that he continues taking medication as prescribed and denied any abuse of alcohol or illicit substances.  Clinician inquired about Travis [name]'s emotional ratings today, as well as any significant changes in thoughts, feelings or behavior since last check-in.  Travis [name] reported scores of 1/10 for depression, 4/10 for anxiety, and disclosed that he documented 2 significant panic episodes over past week in journal.  Clinician inquired about Travis [name]'s successes and struggles over last week.  Travis [name] reported that he did followup with psychiatrist and inform them of his attention problems, which led to change in his sleep medication.  He reported that he held a holiday party with friends over the weekend and had a great time socializing despite initial uneasiness.  Travis [name] reported that journaling about his panic attacks appears helpful so far, as one occurred when reading about loans the other day, and another when he was awaiting a massage appointment at home.  Clinician praised Travis [name] for documenting these events to gain greater insight into triggers, and inquired about how he attempted to cope when symptoms arose.  Travis [name] reported that he went for a walk, did some things around the home to distract himself, and then spoke with his girlfriend about how he was feeling for support, which collectively seemed to calm him down.  He stated "It felt like my brain was racing and it was hard to focus".  Clinician offered to teach Travis [name] 5-4-3-2-1 grounding technique today to assist with temporary distraction from distressing thoughts and feelings that might contribute to panic episodes.  Travis [name] was agreeable to this, so clinician guided him through process of describing 5 things in his environment he could see, 4 things he could touch, 3 things he could hear, 2 things he could  smell, and 1 thing he could taste.  Interventions were effective, as evidenced by Travis [name] successfully participating in exercise and noting that it did appear to be a helpful distraction technique to utilize, stating "I do try to find things to distract myself and take my mind off things when I'm anxious, but this is a lot more structured so I will practice it".  Clinician will continue to monitor.      Plan: Meet again in 1 week.   Diagnosis: Panic Disorder.    Travis Flood, LCSW, LCAS 09/19/2020

## 2020-09-21 DIAGNOSIS — H40023 Open angle with borderline findings, high risk, bilateral: Secondary | ICD-10-CM | POA: Diagnosis not present

## 2020-09-27 ENCOUNTER — Other Ambulatory Visit: Payer: Self-pay

## 2020-09-27 ENCOUNTER — Ambulatory Visit (INDEPENDENT_AMBULATORY_CARE_PROVIDER_SITE_OTHER): Payer: BC Managed Care – PPO | Admitting: Licensed Clinical Social Worker

## 2020-09-27 DIAGNOSIS — F410 Panic disorder [episodic paroxysmal anxiety] without agoraphobia: Secondary | ICD-10-CM

## 2020-09-27 NOTE — Progress Notes (Signed)
THERAPIST PROGRESS NOTE   Session Time: 1:00pm - 2:00pm  Location: Patient: Travis [name] Office Therapist: [name] [facility]   Participation Level: Active   Behavioral Response: Alert, casually dressed, neatly groomed, anxious mood/affect   Type of Therapy: Individual Therapy    Treatment Goals addressed: Coping with anxiety and reducing panic attacks; Medication Compliance; Journaling regularly; Improving relationship communication    Interventions: CBT, coping skills/self-care activities    Summary: Travis [name] is a 34 year old male mixed race male that presented today for in-person therapy appointment with diagnosis of Panic Disorder.       Suicidal/Homicidal: None; without intent or plan.    Therapist Response:  Clinician met with Travis [name] for in person therapy appointment and assessed for safety, sobriety and medication compliance.  Travis [name] presented for today's session on time and was alert, oriented x5, with no evidence or self-report of SI/HI or A/V H.  Travis [name] reported ongoing compliance with medication and denied any abuse of alcohol or illicit substances.  Clinician inquired about Travis [name]'s current emotional ratings, as well as any significant changes in thoughts, feelings or behavior since prior check-in.  Travis [name] reported scores of 1/10 for depression, 4/10 for anxiety, and disclosed that he documented 2 significant panic episodes over last week in his journal.  Clinician inquired about details surrounding these episodes, including insight into triggers that were present, as well as coping skills implemented to intervene.  Travis [name] reported that the first instance was when he was riding in a car with his girlfriend to secure a work Soil scientist, and the other was when he was alone at home and had too much idle time.  Travis [name] reported that he coped with these events by ensuring that he took his medication, tried 5-4-3-2-1 grounding skill, and reached out to supports like his girlfriend to open  up about how he was feeling to provide distraction.  Travis [name] reported that journaling about the episode at home made him realize that idle time gives him an opportunity to dwell on things and feeds his anxiety, so he needs ideas on new activities to engage in for self-care during free time.  Clinician went through a list of 50 different healthy self-care activities with Travis [name] to give him ideas on things to include in free time, weighing pros and cons of examples such as visiting the [place] to find a good book to read, playing video/computer games, taking a walk in the park, doing carpentry/woodworking, building models, going to concerts, organizing the home, practicing deep breathing and more.  Interventions were effective, as evidenced by Travis [name] expressing open mindedness to several of these ideas, such as doing puzzles, building model cars, building furniture kits, using deep breathing, playing video games on his Switch, visiting [employer] to find an interesting book, creating a yoga routine, and planning trips with his girlfriend and dog.  Travis [name] stated "I need more activities to spend my time on, and today gave me a lot of good ideas.  I've made some notes and I'm going to talk to my girlfriend about it too".  Clinician will continue to monitor.      Plan: Meet again in 1 week.   Diagnosis: Panic Disorder.    [name], [place], LCAS 09/27/2020

## 2020-10-05 ENCOUNTER — Other Ambulatory Visit: Payer: Self-pay

## 2020-10-05 ENCOUNTER — Ambulatory Visit (INDEPENDENT_AMBULATORY_CARE_PROVIDER_SITE_OTHER): Payer: BC Managed Care – PPO | Admitting: Licensed Clinical Social Worker

## 2020-10-05 DIAGNOSIS — F410 Panic disorder [episodic paroxysmal anxiety] without agoraphobia: Secondary | ICD-10-CM | POA: Diagnosis not present

## 2020-10-05 NOTE — Progress Notes (Signed)
Virtual Visit via Video Note   I connected with Travis [name] on 10/05/20 at 10:00am by video enabled telemedicine application and verified that I am speaking with the correct person using two identifiers.   I discussed the limitations, risks, security and privacy concerns of performing an evaluation and management service by video and the availability of in person appointments. I also discussed with the patient that there may be a patient responsible charge related to this service. The patient expressed understanding and agreed to proceed.   I discussed the assessment and treatment plan with the patient. The patient was provided an opportunity to ask questions and all were answered. The patient agreed with the plan and demonstrated an understanding of the instructions.   The patient was advised to call back or seek an in-person evaluation if the symptoms worsen or if the condition fails to improve as anticipated.   I provided 1 hour of non-face-to-face time during this encounter.     [name], LCSW, LCAS __________________________ THERAPIST PROGRESS NOTE   Session Time: 10:00am - 11:00am  Location: Patient: Patient home Therapist: [facility]   Participation Level: Active   Behavioral Response: Alert, casually dressed, neatly groomed, euthymic mood/affect   Type of Therapy: Individual Therapy    Treatment Goals addressed: Coping with anxiety and reducing panic attacks; Medication Compliance; Working to improve relationship with girlfriend for support   Interventions: CBT, conflict resolution/communication skills    Summary: Travis [name] is a 34 year old male mixed race male that presented today for virtual therapy appointment with diagnosis of Panic Disorder.       Suicidal/Homicidal: None; without intent or plan.    Therapist Response:  Clinician met with Travis [name] for virtual therapy appointment and assessed for safety, sobriety and medication compliance.  Travis [name]  presented for today's session on time and was alert, oriented x5, with no evidence or self-report of SI/HI or A/V H.  Travis [name] reported he has been responsible with medication and is working to wean himself off of it under supervision from his doctor, noting that coping skills from therapy have been sufficient.  He denied any abuse of alcohol or illicit substances.  Clinician inquired about Travis [name]'s current emotional ratings, as well as any significant changes in thoughts, feelings or behavior since previous check-in.  Travis [name] reported scores of 1/10 for depression, 0/10 for anxiety, and denied any reoccurrence of panic attacks over past week.  Clinician inquired about Travis [name]'s successes and struggles over past week.  Travis [name] reported that he enjoyed his Christmas break and spent time with family.  He reported that he is exploring self-care activities such as doing puzzles, cleaning out his closet, and took his girlfriend to a [place] reported that one struggle was having an argument with his girlfriend over the weekend, noting that she has anger problems and this makes the household tense at times, which can influence panic symptoms. Clinician inquired about what triggered this event, and how Travis [name] attempted to cope.  Travis [name] reported that it was a small misunderstanding and she "Flew into a fury", that was out of proportion to the event itself, and this has been a problem since they began dating.  Clinician went through a handout with Travis [name] that offered strategies for dealing with angry individuals effectively without allowing situation to escalate further, including actively listening to the individual, practicing relaxation techniques, talking in a quiet, calm tone/volume, seeking compromise between parties, and taking time outs when necessary to cool  down before revisiting issue again.  Travis [name] reported that he has taken anger management courses in the past, and this discussion  highlighted some strategies he has relied upon for conflict resolution, such as listening, avoiding passive aggressive behavior, and choosing the appropriate setting for conversation.  Interventions were effective, as evidenced by Travis [name] reporting that this was helpful for giving him new strategies to approach his girlfriend with to keep things calm in the household and improve communication and understanding between them, stating "I see her in myself, and know its not good to respond in anger. I care about the relationship and think I have a different plan in mind to approach her now".  Clinician will continue to monitor.      Plan: Meet again in 1 week.   Diagnosis: Panic Disorder.    [name], LCSW, LCAS 10/05/2020

## 2020-10-11 ENCOUNTER — Ambulatory Visit (INDEPENDENT_AMBULATORY_CARE_PROVIDER_SITE_OTHER): Payer: 59 | Admitting: Licensed Clinical Social Worker

## 2020-10-11 ENCOUNTER — Other Ambulatory Visit: Payer: Self-pay

## 2020-10-11 DIAGNOSIS — F410 Panic disorder [episodic paroxysmal anxiety] without agoraphobia: Secondary | ICD-10-CM | POA: Diagnosis not present

## 2020-10-11 NOTE — Progress Notes (Signed)
THERAPIST PROGRESS NOTE   Session Time: 8:00am - 9:00am  Location: Patient: [facility]rapist: [facility]   Participation Level: Active   Behavioral Response: Alert, casually dressed, neatly groomed, anxious mood/affect   Type of Therapy: Individual Therapy    Treatment Goals addressed: Coping with anxiety and reducing panic attacks; Medication Compliance   Interventions: CBT, soothing grounding techniques    Summary: Kentrail J. Hermiz is a 34 year old male mixed race male that presented today for in-person therapy appointment with diagnosis of Panic Disorder.       Suicidal/Homicidal: None; without intent or plan.    Therapist Response:  Clinician met with Urban for in person therapy session and assessed for safety, sobriety and medication compliance.  Denis presented for today's appointment on time and was alert, oriented x5, with no evidence or self-report of SI/HI or A/V H.  Algenis reported that he continues taking medication as prescribed and denied any abuse of alcohol or illicit substances.  Clinician inquired about Imari's emotional ratings today, as well as any significant changes in thoughts, feelings or behavior since last check-in.  Cambren reported scores of 1/10 for depression, 5/10 for anxiety, and 0/10 for anger/irritability.  He denied experiencing any panic attacks since last session, but noted significant worry of reexperiencing them in days ahead due to upcoming stressors of going out of state on a flight tomorrow, and also sitting in on a court case in following week.  Clinician assisted Urho in creating an action plan to help address anxiety centered upon these two events, including monitoring for typical symptoms of impending panic attack, utilizing support system, and coping skills to de-escalate if necessary.  Clinician also discussed soothing category of grounding techniques with Tore today to help expand upon available coping skills, including  examples such as saying kind/coping statements to oneself, thinking of favorite things, visualizing positive people that are close to him, planning a reward ahead of upcoming challenge, and/or recalling the words to an inspiring song/quotation/quote.  Interventions were effective, as evidenced by Zaylen practicing these techniques in session today and reporting that several appeared effective such as reciting coping statements (i.e. "I'm in control of my situation and destiny", "I haven't had a panic attack in awhile and this feeling will pass"), visualizing his girlfriend or family for support, or thinking of uplifting lyrics from songs he enjoys, so he made notes in his phone to consult if needed in days ahead.  Clinician will continue to monitor.      Plan: Meet again in 1 week.   Diagnosis: Panic Disorder.    Cory Bates, LCSW, LCAS 10/11/2020 

## 2020-10-12 ENCOUNTER — Ambulatory Visit (HOSPITAL_COMMUNITY): Payer: BC Managed Care – PPO | Admitting: Licensed Clinical Social Worker

## 2020-10-18 ENCOUNTER — Ambulatory Visit (HOSPITAL_COMMUNITY): Payer: 59 | Admitting: Licensed Clinical Social Worker

## 2020-10-20 ENCOUNTER — Ambulatory Visit (INDEPENDENT_AMBULATORY_CARE_PROVIDER_SITE_OTHER): Payer: 59 | Admitting: Licensed Clinical Social Worker

## 2020-10-20 ENCOUNTER — Other Ambulatory Visit: Payer: Self-pay

## 2020-10-20 DIAGNOSIS — F410 Panic disorder [episodic paroxysmal anxiety] without agoraphobia: Secondary | ICD-10-CM

## 2020-10-20 NOTE — Progress Notes (Signed)
Virtual Visit via Video Note   I connected with Travis [name] on 10/20/20 at 11:00am by video enabled telemedicine application and verified that I am speaking with the correct person using two identifiers.   I discussed the limitations, risks, security and privacy concerns of performing an evaluation and management service by video and the availability of in person appointments. I also discussed with the patient that there may be a patient responsible charge related to this service. The patient expressed understanding and agreed to proceed.   I discussed the assessment and treatment plan with the patient. The patient was provided an opportunity to ask questions and all were answered. The patient agreed with the plan and demonstrated an understanding of the instructions.   The patient was advised to call back or seek an in-person evaluation if the symptoms worsen or if the condition fails to improve as anticipated.   I provided 1 hour of non-face-to-face time during this encounter.     Travis Flood, LCSW, LCAS __________________________ THERAPIST PROGRESS NOTE   Session Time: 11:00am - 12:00pm  Location: Patient: Patient home Therapist: [facility]   Participation Level: Active   Behavioral Response: Alert, casually dressed, neatly groomed, anxious mood/affect   Type of Therapy: Individual Therapy    Treatment Goals addressed: Coping with anxiety and reducing panic attacks; Medication Compliance; Journaling regularly; Working to improve relationship with girlfriend for support    Interventions: CBT, physical grounding techniques    Summary: Travis [name] is a 35 year old male mixed race male that presented today for virtual therapy appointment with diagnosis of Panic Disorder.       Suicidal/Homicidal: None; without intent or plan.    Therapist Response:  Clinician met with Travis [name] for virtual therapy appointment and assessed for safety, sobriety and medication compliance.   Travis [name] presented for today's session on time and was alert, oriented x5, with no evidence or self-report of SI/HI or A/V H.  Travis [name] reported ongoing compliance with medication and denied any abuse of alcohol or illicit substances.  Clinician inquired about Travis Cortezs current emotional ratings, as well as any significant changes in thoughts, feelings or behavior since previous check-in.  Travis [name] reported scores of 1/10 for depression, 2/10 for anxiety, 0/10 for anger/irritably and disclosed that he almost had 1 panic attack when visiting [facility] over the past weekend.  Clinician inquired about Travis [name] successes and struggles over last week.  Travis [name] reported that he was able to attend court and testify for 3-4 hours straight without any panic episodes, and has made plans to visit family in [place] with girlfriend this week, as well as travel to neighboring states over next 2 weeks for leisure. Travis [name] reported that he almost had a panic attack while on the flight to [place], but was able to take his medication responsibly and use coping techniques to manage symptoms.  He reported that he wished to continue learning new techniques today.  Clinician continued discussion of grounding techniques as a result, focusing upon physical category with examples such as running cool or warm water over hands/skin; touching various objects around oneself and noticing textures, weight, temperature, etc; carrying a grounding object in one's pocket; stretching/doing yoga; clenching/unclenching fists; eating something savory; and/or focusing upon breathing.  Interventions were effective, as evidenced by Travis [name] trying out some of these techniques in session today and expressing interest in adding several to his available coping skills, such as carrying a grounding object, keeping a cold water container to place  on his forehead, starting a yoga routine for stretches, and deep breathing.  Travis [name] stated "I think I was using some  of these subconsciously without even realizing what the purpose was until now, but I will practice them".  Clinician will continue to monitor.      Plan: Meet again in 1 week.   Diagnosis: Panic Disorder.    Travis Flood, LCSW, LCAS 10/20/2020

## 2020-10-25 ENCOUNTER — Other Ambulatory Visit: Payer: Self-pay

## 2020-10-25 ENCOUNTER — Ambulatory Visit (INDEPENDENT_AMBULATORY_CARE_PROVIDER_SITE_OTHER): Payer: 59 | Admitting: Licensed Clinical Social Worker

## 2020-10-25 DIAGNOSIS — F410 Panic disorder [episodic paroxysmal anxiety] without agoraphobia: Secondary | ICD-10-CM | POA: Diagnosis not present

## 2020-10-25 NOTE — Progress Notes (Signed)
Virtual Visit via Video Note   I connected with Travis [name] on 10/25/20 at 11:00am by video enabled telemedicine application and verified that I am speaking with the correct person using two identifiers.   I discussed the limitations, risks, security and privacy concerns of performing an evaluation and management service by video and the availability of in person appointments. I also discussed with the patient that there may be a patient responsible charge related to this service. The patient expressed understanding and agreed to proceed.   I discussed the assessment and treatment plan with the patient. The patient was provided an opportunity to ask questions and all were answered. The patient agreed with the plan and demonstrated an understanding of the instructions.   The patient was advised to call back or seek an in-person evaluation if the symptoms worsen or if the condition fails to improve as anticipated.   I provided 50 minutes of non-face-to-face time during this encounter.     [name], LCSW, LCAS __________________________ THERAPIST PROGRESS NOTE   Session Time: 11:00am - 11:50am  Location: Patient: Patient home Therapist: [facility]   Participation Level: Active   Behavioral Response: Alert, casually dressed, neatly groomed, anxious mood/affect   Type of Therapy: Individual Therapy    Treatment Goals addressed: Coping with anxiety and reducing panic attacks; Medication Compliance; Working to improve relationship with girlfriend for support    Interventions: CBT, healthy boundaries    Summary: Travis [name] is a 35 year old male mixed race male that presented today for virtual therapy appointment with diagnosis of Panic Disorder.       Suicidal/Homicidal: None; without intent or plan.    Therapist Response:  Clinician met with Travis [name] for virtual therapy session and assessed for safety, sobriety and medication compliance.  [name] presented for today's  appointment on time and was alert, oriented x5, with no evidence or self-report of SI/HI or A/V H.  [name] reported that he continues taking medication as prescribed and denied any abuse of alcohol or illicit substances.  Clinician inquired about Travis [name]'s emotional ratings today, as well as any significant changes in thoughts, feelings or behavior since last check-in.  Travis [name] reported scores of 1/10 for depression, 2/10 for anxiety, 0/10 for anger/irritably, but denied any reoccurrence of panic attacks.  Clinician inquired about Travis [name]'s successes and struggles over past week.  Travis [name] reported that he has been "Taking it easy" and spending time with family while balancing work projects.  Travis [name] reported that one struggle was getting into an argument with his girlfriend, but they were able to resolve it by listening to one another, separating briefly to reflect upon what was shared, and then coming together later to have dinner and move past it. He reported that an additional struggle which has not yet resolved has been thinking about the relationship with his mother more often, as he cut contact with her several times over the past 5 years due to her behavior, and they haven't spoken since April 2021 now.  Clinician inquired about specific problematic behaviors that Leotis's mother had previously exhibited, and discussed boundary types (i.e. passive, rigid, and healthy) with him, along with traits of each individual type.  Travis [name] reported that his mother has co-occurring disorders and has been disrespectful towards him a large portion of his life, so he cut contact and enforced rigid boundaries in order to protect his own mental health, but other family members continue to push for her to speak with her again despite  these negative experiences, leading him to question his decision occasionally.  Clinician assisted Travis [name] in weighing pros and cons of opening up contact with his mother again in based upon his  past experiences with adjusting boundaries.  Interventions were effective, as evidenced by Travis [name] reporting that this discussion provided positive reinforcement for his past actions with boundaries, stating "Right now my gut instinct is telling me that I need to wait and see more effort from her.  She has to understand that this is on my terms and I can't risk opening contact again until we are both more stable.  I'm gonna focus myself for now until something tells me it's the right time for Travis [name] to reconnect".  Clinician will continue to monitor.      Plan: Meet again in 1 week.   Diagnosis: Panic Disorder.    [name], LCSW, LCAS 10/25/2020

## 2020-11-01 ENCOUNTER — Other Ambulatory Visit: Payer: Self-pay

## 2020-11-01 ENCOUNTER — Ambulatory Visit (INDEPENDENT_AMBULATORY_CARE_PROVIDER_SITE_OTHER): Payer: 59 | Admitting: Licensed Clinical Social Worker

## 2020-11-01 DIAGNOSIS — F410 Panic disorder [episodic paroxysmal anxiety] without agoraphobia: Secondary | ICD-10-CM

## 2020-11-01 NOTE — Progress Notes (Signed)
Virtual Visit via Video Note   I connected with Travis [name] on 11/01/20 at 11:00am by video enabled telemedicine application and verified that I am speaking with the correct person using two identifiers.   I discussed the limitations, risks, security and privacy concerns of performing an evaluation and management service by video and the availability of in person appointments. I also discussed with the patient that there may be a patient responsible charge related to this service. The patient expressed understanding and agreed to proceed.   I discussed the assessment and treatment plan with the patient. The patient was provided an opportunity to ask questions and all were answered. The patient agreed with the plan and demonstrated an understanding of the instructions.   The patient was advised to call back or seek an in-person evaluation if the symptoms worsen or if the condition fails to improve as anticipated.   I provided 38 minutes of non-face-to-face time during this encounter.     [name], LCSW, LCAS __________________________ THERAPIST PROGRESS NOTE   Session Time: 11:00am - 11:38am  Location: Patient: Patient home Therapist: [facility]   Participation Level: Active   Behavioral Response: Alert, casually dressed, neatly groomed, euthymic mood/affect   Type of Therapy: Individual Therapy    Treatment Goals addressed: Coping with anxiety and reducing panic attacks; Medication Compliance   Interventions: CBT, mental grounding techniques   Summary: Travis [name] is a 35 year old mixed race male that presented today for virtual therapy appointment with diagnosis of Panic Disorder.       Suicidal/Homicidal: None; without intent or plan.    Therapist Response:  Clinician met with Travis [name] for virtual therapy appointment and assessed for safety, sobriety and medication compliance.  Travis [name] presented for today's session on time and was alert, oriented x5, with no evidence  or self-report of Travis [name]/HI or A/V H.  Travis [name] reported that he continues taking medication responsibly and denied any abuse of alcohol or illicit substances.  Clinician inquired about Caelin's current emotional ratings, as well as any significant changes in thoughts, feelings or behavior since previous check-in.  Travis [name] reported scores of 1/10 for depression, and anxiety, 0/10 for anger/irritably, but denied experiencing any panic attacks.  Travis [name] reported that he spent much of the past week socializing with his girlfriend, family, and friends, which was enjoyable.  Travis [name] reported that he did almost experience a panic attack while speaking with his girlfriend and her mother, but could not recall what may have triggered it, so he plans to be more mindful of how certain topics influence his mood.  Travis [name] reported that he has been utilizing relaxation and grounding skills regularly, and wished to continue learning new ones today in session.  Clinician covered category of mental grounding techniques with Travis [name], including examples such as describing one's environment in detail, playing a categories game with oneself, describing an everyday activity in great detail, imagining a pleasant or comforting image, reading something calming, using humor, or counting to 10/reciting the alphabet.  Interventions were effective, as evidenced by Travis [name] practicing these techniques in session today with clinician and reporting that several were appealing to him, including remembering corny jokes his grandfather has shared before, visualizing himself on a beach on a warm day, playing a categories game involving listing sports teams and/or players or counting his breaths.  Travis [name] reported that he believes continued practice of grounding techniques has been effective in "Changing my thought process" to limit panic episodes and will add these to  his available skillset.  Clinician will continue to monitor.      Plan: Meet again in 1  week.   Diagnosis: Panic Disorder.    [name], [place], LCAS 11/01/2020

## 2020-11-08 ENCOUNTER — Other Ambulatory Visit: Payer: Self-pay

## 2020-11-08 ENCOUNTER — Ambulatory Visit (INDEPENDENT_AMBULATORY_CARE_PROVIDER_SITE_OTHER): Payer: 59 | Admitting: Licensed Clinical Social Worker

## 2020-11-08 DIAGNOSIS — F410 Panic disorder [episodic paroxysmal anxiety] without agoraphobia: Secondary | ICD-10-CM | POA: Diagnosis not present

## 2020-11-08 NOTE — Progress Notes (Signed)
Virtual Visit via Video Note   I connected with Travis [name] on 11/08/20 at 11:00am by video enabled telemedicine application and verified that I am speaking with the correct person using two identifiers.   I discussed the limitations, risks, security and privacy concerns of performing an evaluation and management service by video and the availability of in person appointments. I also discussed with the patient that there may be a patient responsible charge related to this service. The patient expressed understanding and agreed to proceed.   I discussed the assessment and treatment plan with the patient. The patient was provided an opportunity to ask questions and all were answered. The patient agreed with the plan and demonstrated an understanding of the instructions.   The patient was advised to call back or seek an in-person evaluation if the symptoms worsen or if the condition fails to improve as anticipated.   I provided 1 hour of non-face-to-face time during this encounter.     [name], LCSW, LCAS __________________________ THERAPIST PROGRESS NOTE   Session Time: 11:00am - 12:00pm  Location: Patient: Patient home Therapist: [facility]   Participation Level: Active   Behavioral Response: Alert, casually dressed, neatly groomed, euthymic mood/affect   Type of Therapy: Individual Therapy    Treatment Goals addressed: Coping with anxiety and reducing panic attacks; Medication Compliance   Interventions: CBT, challenging negative automatic thoughts   Summary: Travis [name] is a 35 year old mixed race male that presented today for virtual therapy appointment with diagnosis of Panic Disorder.       Suicidal/Homicidal: None; without intent or plan.    Therapist Response:  Clinician met with Travis [name] for virtual therapy session and assessed for safety, sobriety and medication compliance.  Travis [name] presented for today's appointment on time and was alert, oriented x5, with no  evidence or self-report of SI/HI or A/V H.  Travis [name] reported ongoing compliance with medication and denied any abuse of alcohol or illicit substances.  Clinician inquired about Travis [name]'s emotional ratings today, as well as any significant changes in thoughts, feelings or behavior since last check-in.  Travis [name] reported scores of 1/10 for depression, 2/10 for anxiety, 0/10 for anger/irritably, and denied any reoccurrence of panic attacks.  Travis [name] reported that he just returned from visiting family out of town again and had a good time, but is feeling nervous about an upcoming trip to [place] they proposed for March.  Travis [name] reported that he worries about having a panic attack on this trip, and asked clinician for assistance in preparing for this challenge.  Clinician utilized 'worry exploration' handout with Travis [name] in session today to aid him in challenging automatic negative thoughts related to this trip, in addition to developing a realistic crisis intervention plan in case he does begin to experience panic symptoms again.  This plan included listing panic symptoms (I.e. sweating, rapid heart rate, shallow breathing, shakiness, etc) Travis [name] has experienced previously, triggers to be wary of, coping skills he can employ should symptoms appear, and available support to reach out to if necessary.  Interventions were effective, as evidenced by Travis [name] reporting that he felt more comfortable with the idea of going on this trip following completion of activity, since he was able to develop a realistic plan for managing symptoms that might arise, think more rationally about the situation, and recognize progress he has made in keeping these panic episodes at bay over past months.  Travis [name] stated "I know that I will be okay no matter what happens,  I just have to use the techniques I've learned, talk to my girlfriend about how I'm feeling, and avoid becoming a hermit".  Clinician will continue to monitor.      Plan:  Meet again in 1 week.   Diagnosis: Panic Disorder.    [name], [place], [place] 11/08/2020

## 2020-11-15 ENCOUNTER — Ambulatory Visit (INDEPENDENT_AMBULATORY_CARE_PROVIDER_SITE_OTHER): Payer: 59 | Admitting: Licensed Clinical Social Worker

## 2020-11-15 ENCOUNTER — Other Ambulatory Visit: Payer: Self-pay

## 2020-11-15 DIAGNOSIS — F410 Panic disorder [episodic paroxysmal anxiety] without agoraphobia: Secondary | ICD-10-CM

## 2020-11-15 NOTE — Progress Notes (Signed)
THERAPIST PROGRESS NOTE   Session Time: 11:00am - 12:00pm  Location: Patient: [place] [facility] Therapist: [facility]   Participation Level: Active   Behavioral Response: Alert, casually dressed, neatly groomed, anxious mood/affect   Type of Therapy: Individual Therapy    Treatment Goals addressed: Coping with anxiety and reducing panic attacks; Setting aside self-care time; Medication Compliance; Improving relationship with girlfriend    Interventions: CBT, sleep hygiene techniques, problem solving    Summary: Travis [name] is a 35 year old mixed race male that presented today in person for therapy appointment with diagnosis of Panic Disorder.       Suicidal/Homicidal: None; without intent or plan.    Therapist Response:  Clinician met with Travis [name] for in person therapy appointment and assessed for safety, sobriety and medication compliance.  Wisdom presented for today's session on time and was alert, oriented x5, with no evidence or self-report of SI/HI or A/V H.  Travis [name] reported that he continues to use medication responsibly and denied any abuse of alcohol or illicit substances.  Clinician inquired about Glenda's current emotional ratings, as well as any significant changes in thoughts, feelings or behavior since previous check-in.  Travis [name] reported scores of 0/10 for depression, 3/10 for anxiety, 0/10 for anger/irritably, and denied experiencing any panic attacks, although he noted that he has been feeling slightly more anxious in recent days.  Clinician inquired about stressors contributing to increase in anxiety, and Travis [name]'s attempts to cope.  Travis [name] reported that his sleep patterns have changed since returning from vacation, leading him to wake up later and feel groggy.  Clinician discussed sleep hygiene techniques with Travis [name] which could be implemented in order to address this issue, including examples such as establishing a regular sleep/wake routine, taking medications at  time recommended by doctor, avoiding use of electronics close to bedtime, and more.  Travis [name] acknowledged that he used to take his sleep medicine at 7-8pm in the evening, but during the trip he began taking it a few hours later, so he believes that this could be a significant factor, and reported that he would begin setting reminders on his phone so that he can get back into a normal cycle.  Travis [name] reported that since returning from vacation he has also experienced racing thoughts at night prior to bed thinking about everything he needs to get done the following day, which has been overwhelming at times.  Clinician recommended Travis [name] begin using a planner or calendar in order to plan out days ahead to be more organized/manageable, including prioritizing tasks, and ensuring some time for self-care.  Travis [name] reported that he believes increasing structure via planner would be beneficial, so he will pick one up today and revisit it daily to ensure that he is not "Overloading himself" with tasks, stating "When I started working again in the past I wasn't taking time for myself and I think I was about to restart that trend".  Travis [name] reported that one additional stressor is his relationship, as his girlfriend has continued to trigger him at times and has not sought therapy for herself.  Clinician discussed strategies with Travis [name] for communicating these concerns appropriately while avoiding typical traps (I.e. stonewalling, criticizing, blaming, etc) in order to reduce stress and increase understanding and support between them, including possibility of seeking couples counseling if pattern continues to persist.  Intervention was effective, as evidenced by Travis [name] reporting that this was helpful to reflect upon, so he would plan to approach her in following days about ongoing  concerns regarding impact the relationship is having upon his mental health, and negotiate a reasonable timeline before he suggests entering into  couples counseling for more direct professional assistance.  Clinician will continue to monitor.      Plan: Meet again in 1 week.   Diagnosis: Panic Disorder.    [name], [place], [place] 11/15/2020

## 2020-11-22 ENCOUNTER — Other Ambulatory Visit: Payer: Self-pay

## 2020-11-22 ENCOUNTER — Ambulatory Visit (INDEPENDENT_AMBULATORY_CARE_PROVIDER_SITE_OTHER): Payer: 59 | Admitting: Licensed Clinical Social Worker

## 2020-11-22 DIAGNOSIS — F410 Panic disorder [episodic paroxysmal anxiety] without agoraphobia: Secondary | ICD-10-CM

## 2020-11-22 NOTE — Progress Notes (Signed)
THERAPIST PROGRESS NOTE   Session Time: 2:00pm - 3:00pm  Location: Patient: [place] [facility] Therapist: [facility] [place] [facility]   Participation Level: Active   Behavioral Response: Alert, casually dressed, neatly groomed, anxious mood/affect   Type of Therapy: Individual Therapy    Treatment Goals addressed: Coping with anxiety and reducing panic attacks; Setting aside self-care time; Medication Compliance; Improving relationship with girlfriend    Interventions: CBT, assertive communication/healthy boundaries, nutritional and pain assessments, PHQ9 and GAD7 screenings   Summary: Travis [name] is a 35 year old mixed race male that presented today in person for therapy appointment with diagnosis of Panic Disorder.       Suicidal/Homicidal: None; without intent or plan.    Therapist Response:  Clinician met with Travis [name] for in person therapy session and assessed for safety, sobriety and medication compliance.  Dejaun presented for today's appointment on time and was alert, oriented x5, with no evidence or self-report of SI/HI or A/V H.  Demaryius reported ongoing compliance with medication and denied any abuse of alcohol or illicit substances.  Clinician inquired about Kienan's emotional ratings today, as well as any significant changes in thoughts, feelings or behavior since last check-in.  Asad reported scores of 2/10 for depression, 2-3/10 for anxiety, 0/10 for anger/irritably, and denied any reoccurrence of panic attacks.  Michaeljohn reported that he did experience some physical panic symptoms when he and his girlfriend had a disagreement over the weekend, and he decided that as a result of ongoing disagreements, they should take a break from each other for the time being.  He stated "I let her know that I shouldn't be feeling like this all the time when we're together, and she got defensive.  I listened and didn't say anything at first, but came to conclusion that some time apart would help".  Viren  reported that she has temporarily moved back in with family in [place], and informed him the other day that she now has an initial appointment with her own therapist as a result, which he had been suggesting for months.  Clinician praised Jody for recognizing impact that the relationship has had upon his mental health and panic attack frequency, and revisited strategies for managing boundaries appropriately with triggering people.  Clinician also assisted Patrik in determining a realistic timeline for break in the relationship, and list of changes he would like to see in overall behavior before he decides to reconnect. Jaquise reported that he will wait 2-3 months before considering them moving back in together, but continue chatting weekly on the phone to monitor for progress, stating "We tried to talk on the phone Monday but it didn't go well.  It still feels like walking on eggshells with her sometimes".  He reported that in the meantime he would try to work on self-care routine to manage work related stress.  Progress is evidenced by Travis [name] improving boundaries within support network to reduce overall frequency of panic episodes, and focusing more on self-care in order to provide appropriate outlet for stress during the busy work week.  Copper stated "I feel good about standing up for myself and being more assertive.  There is this immense sense of relief to come home and have emotional space to breathe".   Clinician also completed nutritional and pain assessments, indicating no present issues to address.  Clinician completed updated PHQ9 and GAD7 screenings, showing improvements over past weeks in overall symptoms, with scores of 1 and 3 respectively.  Clinician will continue to  monitor.      Plan: Meet again in 1 week.   Diagnosis: Panic Disorder.    [name], [place], LCAS 11/22/2020

## 2020-11-29 ENCOUNTER — Ambulatory Visit (INDEPENDENT_AMBULATORY_CARE_PROVIDER_SITE_OTHER): Payer: 59 | Admitting: Licensed Clinical Social Worker

## 2020-11-29 ENCOUNTER — Other Ambulatory Visit: Payer: Self-pay

## 2020-11-29 DIAGNOSIS — F410 Panic disorder [episodic paroxysmal anxiety] without agoraphobia: Secondary | ICD-10-CM | POA: Diagnosis not present

## 2020-11-29 NOTE — Progress Notes (Signed)
THERAPIST PROGRESS NOTE   Session Time: 1:00pm - 2:00pm  Location: Patient: [name] [facility] Therapist: [place] [facility]   Participation Level: Active   Behavioral Response: Alert, casually dressed, neatly groomed, irritable mood/affect   Type of Therapy: Individual Therapy    Treatment Goals addressed: Coping with anxiety and reducing panic attacks; Medication Compliance; Managing boundaries within relationships    Interventions: CBT, assertive communication skills and boundaries   Summary: Travis [name] is a 35 year old male mixed race male that presented today for in-person therapy appointment with diagnosis of Panic Disorder.       Suicidal/Homicidal: None; without intent or plan.    Therapist Response:  Clinician met with Travis [name] for in person therapy session and assessed for safety, sobriety and medication compliance.  Travis [name] presented for today's appointment on time and was alert, oriented x5, with no evidence or self-report of SI/HI or A/V H.  Travis [name] reported that he continues taking medication responsibly and denied any abuse of alcohol or illicit substances.  Clinician inquired about Travis [name]'s emotional ratings today, as well as any significant changes in thoughts, feelings or behavior since last check-in.  Adi reported scores of 1/10 for depression, 2/10 for anxiety, and 6/10 for irritability, but denied any reoccurrence of panic attacks.  Travis [name] reported that she was feeling more irritable today due to a friend calling several times in past week to ask whether he and his girlfriend will be accompanying them on a birthday trip.  Travis [name] stated "I feel like I've made it clear me and her are on a break, but I'm probably not the best when it comes to boundaries and saying 'No'".  Clinician assisted Travis [name] by discussing changes to communication style which would offer more assertive approach, including roleplaying responses to requests from this friend if they continue to bring this  issue up.  Travis [name] reported that this has also led him to be in communication with his girlfriend to discuss this issue, which has built up tension, and made him second guess original timeline for maintaining distance from one another.  Clinician assisted Travis [name] in weighing pros and cons of adjusting boundaries with partner based upon any changes in behavior which have been seen since she moved out.  Interventions were effective, as evidenced by Travis [name] reporting greater confidence in decision making skills and motivation to manage boundaries appropriately, stating "I need to speak up when someone is pushing me, and I think that its still too soon before me and her move back in together".  He stated "I've been feeling relieved and its been nice not having to consider someone else's feelings every day. I think I still need time for myself and will wait until she's been to therapy as well".  Clinician will continue to monitor.      Plan: Meet again in 1 week.   Diagnosis: Panic Disorder.    [name], [place], LCAS 11/29/2020

## 2020-12-05 ENCOUNTER — Ambulatory Visit (HOSPITAL_COMMUNITY): Payer: 59 | Admitting: Licensed Clinical Social Worker

## 2020-12-05 ENCOUNTER — Ambulatory Visit (INDEPENDENT_AMBULATORY_CARE_PROVIDER_SITE_OTHER): Payer: 59 | Admitting: Licensed Clinical Social Worker

## 2020-12-05 ENCOUNTER — Other Ambulatory Visit: Payer: Self-pay

## 2020-12-05 DIAGNOSIS — F410 Panic disorder [episodic paroxysmal anxiety] without agoraphobia: Secondary | ICD-10-CM

## 2020-12-05 NOTE — Progress Notes (Signed)
THERAPIST PROGRESS NOTE   Session Time: 3:00pm - 4:00pm  Location: Patient: [place] [facility] Therapist: [place] [facility]   Participation Level: Active   Behavioral Response: Alert, casually dressed, neatly groomed, euthymic mood/affect   Type of Therapy: Individual Therapy    Treatment Goals addressed: Coping with anxiety and reducing panic attacks; Medication Compliance; Managing boundaries within relationships   Interventions: CBT, problem solving   Summary: Joangel Vanosdol is a 35 year old male mixed race male that presented today for in-person therapy appointment with diagnosis of Panic Disorder.       Suicidal/Homicidal: None; without intent or plan.    Therapist Response:  Clinician met with [name] for in person therapy appointment and assessed for safety, sobriety and medication compliance.  Zidane presented for today's session on time and was alert, oriented x5, with no evidence or self-report of active SI/HI or A/V H.  Decker reported ongoing compliance with medication and denied any abuse of alcohol or illicit substances.  Clinician inquired about Kern's current emotional ratings, as well as any significant changes in thoughts, feelings or behavior since previous check-in.  Garlon reported scores of 1/10 for depression, 1/10 for anxiety, and 1/10 for anger/irritability, but denied any reoccurrence of panic attacks.  Riyad reported that he and his girlfriend have been talking, and he postponed them hanging out again just yet, stating "I told her it didn't feel like the right time.  We shouldn't just jump into things again yet until we can communicate".  Jakyri reported that his partner wasn't 'thrilled' by this, but she appears invested due to engaging in her own therapy sessions and appearing more open to couples counseling if needed.  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  Clinician discussed importance of maintaining healthy work life balance to ward off burnout and protect mental health, including strategies to assist during this transition, such as ensuring a realistic work schedule split between his jobs, watching for red flags indicating burnout, utilizing coping skills, setting aside time for self-care, getting enough rest each night, and taking time off when needed.  Intervention was effective, as evidenced by [name] reporting that this topic was helpful to discuss, as he has been prone to overloading his schedule in the past, which led to excessive stress, and insufficient coping skills, so he will limit himself to 40-50 hours per week between these jobs, network with people that can help delegate duties, and ensure time for self-care, stating "I need to keep putting myself first and structure my life a little differently".  Clinician will continue to monitor.      Plan: Meet again in 1 week.   Diagnosis: Panic Disorder.    [name], [place], LCAS 12/05/2020

## 2020-12-07 ENCOUNTER — Other Ambulatory Visit: Payer: Self-pay

## 2020-12-07 ENCOUNTER — Encounter (INDEPENDENT_AMBULATORY_CARE_PROVIDER_SITE_OTHER): Payer: Self-pay | Admitting: Family Medicine

## 2020-12-07 ENCOUNTER — Telehealth (INDEPENDENT_AMBULATORY_CARE_PROVIDER_SITE_OTHER): Payer: 59 | Admitting: Family Medicine

## 2020-12-07 DIAGNOSIS — F410 Panic disorder [episodic paroxysmal anxiety] without agoraphobia: Secondary | ICD-10-CM | POA: Diagnosis not present

## 2020-12-07 DIAGNOSIS — E559 Vitamin D deficiency, unspecified: Secondary | ICD-10-CM

## 2020-12-07 DIAGNOSIS — Z6841 Body Mass Index (BMI) 40.0 and over, adult: Secondary | ICD-10-CM

## 2020-12-07 MED ORDER — VITAMIN D (ERGOCALCIFEROL) 1.25 MG (50000 UNIT) PO CAPS: 50000.0000 [IU] | ORAL_CAPSULE | ORAL | 0 refills | Status: DC

## 2020-12-11 NOTE — Progress Notes (Signed)
TeleHealth Visit:  Due to the COVID-19 pandemic, this visit was completed with telemedicine (audio/video) technology to reduce patient and provider exposure as well as to preserve personal protective equipment.   Bowman has verbally consented to this TeleHealth visit. The patient is located at home, the provider is located at the [facility] office. The participants in this visit include the listed provider and patient. The visit was conducted today via video.   Chief Complaint: OBESITY Travis [name] is here to discuss his progress with his obesity treatment plan along with follow-up of his obesity related diagnoses. Travis [name] is not following a plan. Travis [name] states he is being active.  Today's visit was #: 14 Starting weight: 399 lbs Starting date: 01/05/2020  Interim History: Travis [name] hasn't returned to the clinic since November 2021. He reports a weight os 388 lbs. He voices he wants to start up program but concerned that his anxiety could worsen. He was eating out more recently and choosing more convenient easier options. He is looking for easier options to decreased clean up.  Subjective:   1. Vitamin D deficiency Pt denies nausea, vomiting, and muscle weakness but notes fatigue. Pt was previously on prescription Vit D.  2. Panic disorder Pt is seeing a psychologist and psychiatrist. He is on Effexor, trazodone, and doxepin.  Assessment/Plan:   1. Vitamin D deficiency Low Vitamin D level contributes to fatigue and are associated with obesity, breast, and colon cancer. He agrees to restart prescription Vitamin D @50 ,000 IU every week and will follow-up for routine testing of Vitamin D, at least 2-3 times per year to avoid over-replacement.  - Vitamin D, Ergocalciferol, (DRISDOL) 1.25 MG (50000 UNIT) CAPS capsule; Take 1 capsule (50,000 Units total) by mouth every 7 (seven) days.  Dispense: 12 capsule; Refill: 0  2. Panic disorder Pt is to continue current medication  and current treatment option of psychiatrist and psychologist.  3. Class 3 severe obesity with serious comorbidity and body mass index (BMI) of 45.0 to 49.9 in adult, unspecified obesity type Travis [name]) Travis [name] is currently in the action stage of change. As such, his goal is to continue with weight loss efforts. He has agreed to practicing portion control and making smarter food choices, such as increasing vegetables and decreasing simple carbohydrates.   Exercise goals: No exercise has been prescribed at this time.  Behavioral modification strategies: increasing lean protein intake, meal planning and cooking strategies, keeping healthy foods in the home and planning for success.  Travis [name] has agreed to follow-up with our clinic in 4 weeks. He was informed of the importance of frequent follow-up visits to maximize his success with intensive lifestyle modifications for his multiple health conditions.  Objective:   VITALS: Per patient if applicable, see vitals. GENERAL: Alert and in no acute distress. CARDIOPULMONARY: No increased WOB. Speaking in clear sentences.  PSYCH: Pleasant and cooperative. Speech normal rate and rhythm. Affect is appropriate. Insight and judgement are appropriate. Attention is focused, linear, and appropriate.  NEURO: Oriented as arrived to appointment on time with no prompting.   Lab Results  Component Value Date   CREATININE 0.73 08/02/2020   BUN 16 08/02/2020   NA 137 08/02/2020   K 4.6 08/02/2020   CL 102 08/02/2020   CO2 22 08/02/2020   Lab Results  Component Value Date   ALT 52 (H) 08/02/2020   AST 26 08/02/2020   ALKPHOS 46 06/29/2020   BILITOT 0.3 08/02/2020   Lab Results  Component Value Date  HGBA1C 5.5 01/05/2020   HGBA1C 5.6 07/29/2019   Lab Results  Component Value Date   INSULIN 15.6 01/31/2020   INSULIN CANCELED 01/05/2020   Lab Results  Component Value Date   TSH 1.580 01/31/2020   Lab Results  Component Value Date   CHOL 200 (H)  08/02/2020   HDL 63 08/02/2020   LDLCALC 122 (H) 08/02/2020   TRIG 63 08/02/2020   CHOLHDL 3.2 08/02/2020   Lab Results  Component Value Date   WBC 6.2 06/29/2020   HGB 15.4 06/29/2020   HCT 45.7 06/29/2020   MCV 83.1 06/29/2020   PLT 205 06/29/2020   No results found for: IRON, TIBC, FERRITIN  Attestation Statements:   Reviewed by clinician on day of visit: allergies, medications, problem list, medical history, surgical history, family history, social history, and previous encounter notes.  Coral Ceo, am acting as transcriptionist for Coralie Common, MD.   I have reviewed the above documentation for accuracy and completeness, and I agree with the above. - Jinny Blossom, MD

## 2020-12-13 ENCOUNTER — Ambulatory Visit (INDEPENDENT_AMBULATORY_CARE_PROVIDER_SITE_OTHER): Payer: 59 | Admitting: Licensed Clinical Social Worker

## 2020-12-13 ENCOUNTER — Other Ambulatory Visit: Payer: Self-pay

## 2020-12-13 DIAGNOSIS — F410 Panic disorder [episodic paroxysmal anxiety] without agoraphobia: Secondary | ICD-10-CM | POA: Diagnosis not present

## 2020-12-13 NOTE — Progress Notes (Signed)
THERAPIST PROGRESS NOTE   Session Time: 3:00pm - 4:00pm  Location: Patient: [name] Therapist: [place] [facility]   Participation Level: Active   Behavioral Response: Alert, casually dressed, neatly groomed, depressed mood/affect   Type of Therapy: Individual Therapy    Treatment Goals addressed: Coping with anxiety and reducing panic attacks; Medication Compliance; Managing boundaries within relationships   Interventions: CBT: core beliefs   Summary: Travis [name] is a 35 year old male mixed race male that presented today for in-person therapy appointment with diagnosis of Panic Disorder.       Suicidal/Homicidal: None; without intent or plan.    Therapist Response:  Clinician met with Travis [name] for in person therapy session and assessed for safety, sobriety and medication compliance.  Travis [name] presented for today's appointment on time and was alert, oriented x5, with no evidence or self-report of active SI/HI or A/V H.  Travis [name] reported that he continues managing prescriptions appropriately and denied any abuse of alcohol or illicit substances.  Clinician inquired about Travis [name]'s emotional ratings today, as well as any significant changes in thoughts, feelings or behavior since last check-in.  Travis [name] reported scores of 4/10 for depression, 3/10 for anxiety, and 0/10 for anger/irritability, but denied any reoccurrence of panic attacks.  Travis [name] reported that he and his girlfriend spoke on Saturday and she was supposed to visit over the weekend, but he decided to postponed, which led them to breakup, although they both remain hopeful issues can be resolved with time.  Travis [name] reported that she flew down, packed up her belongings on Monday, and left Tuesday with a [employer], stating "I had a lot of feelings, like I failed and didn't maintain the relationship like I should have".  Clinician covered a handout with Travis [name] on core beliefs today and explained how negative ones can influence mood and  outlook.  Clinician went through a checklist with Travis [name] of various examples of negative core beliefs (I.e. "I'm unlovable", "I'm worthless", "I'm a bad person", etc) and assisted him in identifying evidence which contradicts these beliefs in order to improve perspective.  [name] acknowledged that lately he has felt that he isn't good enough at maintaining relationships, doesn't know how to love someone, is boring, and worthless, but was able to challenge these beliefs with prompting, noting that counter evidence includes the fact that he maintained the relationship for 3 years, was supportive, tried to get them involved in couple's counseling, and has much to offer as a partner.  Intervention was effective, as evidenced by Travis [name] reporting that this session helped him realize the impact that core beliefs have upon his relationship and mental health, stating "I need to be more mindful to challenge these negative thoughts when they pop in my head and stop assuming they are true".  Clinician will continue to monitor.      Plan: Meet again in 1 week.   Diagnosis: Panic Disorder.    [name], [place], LCAS 12/13/2020

## 2020-12-20 ENCOUNTER — Other Ambulatory Visit: Payer: Self-pay

## 2020-12-20 ENCOUNTER — Ambulatory Visit (INDEPENDENT_AMBULATORY_CARE_PROVIDER_SITE_OTHER): Payer: 59 | Admitting: Licensed Clinical Social Worker

## 2020-12-20 DIAGNOSIS — F410 Panic disorder [episodic paroxysmal anxiety] without agoraphobia: Secondary | ICD-10-CM

## 2020-12-20 NOTE — Progress Notes (Signed)
THERAPIST PROGRESS NOTE   Session Time: 3:00pm - 4:00pm  Location: Patient: [place] [facility] Therapist: [place] [facility]   Participation Level: Active   Behavioral Response: Alert, casually dressed, neatly groomed, anxious mood/affect   Type of Therapy: Individual Therapy    Treatment Goals addressed: Coping with anxiety and reducing panic attacks; Medication Compliance; Sleep hygiene; Managing boundaries within relationships   Interventions: CBT, sleep hygiene techniques, healthy boundaries    Summary: Travis [name] is a 35 year old male mixed race male that presented today for in-person therapy appointment with diagnosis of Panic Disorder.       Suicidal/Homicidal: None; without intent or plan.    Therapist Response:  Clinician met with Travis [name] for in person therapy appointment and assessed for safety, sobriety and medication compliance.  Travis [name] presented for today's session on time and was alert, oriented x5, with no evidence or self-report of active SI/HI or A/V H.  Travis [name] reported ongoing compliance with medication and denied any abuse of alcohol or illicit substances.  Clinician inquired about Travis [name]'s current emotional ratings, as well as any significant changes in thoughts, feelings or behavior since previous check-in.  Travis [name] reported scores of 2/10 for depression, 3/10 for anxiety, and 0/10 for anger/irritability, but denied experiencing any reoccurrence of panic attacks.  Travis [name] stated "I did feel pretty cruddy on Monday due to lack of sleep".  Travis [name] reported that he stayed up late with a friend that night and began to notice some familiar symptoms that appear during panic attacks, which led him to take his antianxiety medication to cope.  Clinician discussed sleep hygiene techniques with Travis [name] today to avoid similar reoccurrence in the future, including importance of keeping regular sleep/wake routine, avoiding use of electronics around bedtime, and more.  Travis [name] reported that  he has also been stressed about work projects, but is still prioritizing setting aside time for self-care, including taking the dog for a walk yesterday and scheduling time to hang out with friends.  He reported that he is particularly stressed about his grandmother at present, as she is pressing him to reconnect with his mother despite negative impact this has historically had upon his mental health.  Clinician assisted Travis [name] by revisiting topic of managing healthy boundaries with triggering individuals, and weighing costs/benefits of opening communication with his mother again to appease other family members.  Intervention was effective, as evidenced by Travis [name] assembling a long list of negative consequences that could result from speaking to his mother again at this point in his treatment, noting that he is always the one who gives her another chance due to pressure from family, despite the fact that she has made no effort to seek treatment for her own problems or take responsibility for hurtful actions which led him to originally put these boundaries in place.  He stated "This was really helpful.  I felt cornered the other day when she brought it up, but now I can defend myself if it comes up again and feel more confident in my decision".  Clinician will continue to monitor.      Plan: Meet again in 1 week.   Diagnosis: Panic Disorder.    [name], [place], LCAS 12/20/2020

## 2020-12-25 ENCOUNTER — Other Ambulatory Visit: Payer: Self-pay | Admitting: Family Medicine

## 2020-12-25 ENCOUNTER — Telehealth: Payer: Self-pay | Admitting: Family Medicine

## 2020-12-25 ENCOUNTER — Other Ambulatory Visit: Payer: Self-pay

## 2020-12-25 ENCOUNTER — Other Ambulatory Visit (HOSPITAL_COMMUNITY)
Admission: RE | Admit: 2020-12-25 | Discharge: 2020-12-25 | Disposition: A | Discharge status: Home / Self Care | Payer: 59 | Source: Ambulatory Visit | Attending: Family Medicine | Admitting: Family Medicine

## 2020-12-25 DIAGNOSIS — Z202 Contact with and (suspected) exposure to infections with a predominantly sexual mode of transmission: Secondary | ICD-10-CM

## 2020-12-25 NOTE — Telephone Encounter (Signed)
Patient would like lab orders moved to [facility] lab, patient would like to go today   Please

## 2020-12-25 NOTE — Telephone Encounter (Signed)
Changed to [facility]

## 2020-12-25 NOTE — Addendum Note (Signed)
Addended by: Jeronimo Greaves on: 12/25/2020 02:37 PM   Modules accepted: Orders

## 2020-12-25 NOTE — Addendum Note (Signed)
Addended by: Jeronimo Greaves on: 12/25/2020 02:38 PM   Modules accepted: Orders

## 2020-12-26 LAB — URINE CYTOLOGY ANCILLARY ONLY
Chlamydia: NEGATIVE
Comment: NEGATIVE
Comment: NEGATIVE
Comment: NORMAL
Neisseria Gonorrhea: NEGATIVE
Trichomonas: NEGATIVE

## 2020-12-27 ENCOUNTER — Other Ambulatory Visit: Payer: Self-pay

## 2020-12-27 ENCOUNTER — Ambulatory Visit (INDEPENDENT_AMBULATORY_CARE_PROVIDER_SITE_OTHER): Payer: 59 | Admitting: Licensed Clinical Social Worker

## 2020-12-27 DIAGNOSIS — F410 Panic disorder [episodic paroxysmal anxiety] without agoraphobia: Secondary | ICD-10-CM

## 2020-12-27 LAB — HIV ANTIBODY (ROUTINE TESTING W REFLEX): HIV 1&2 Ab, 4th Generation: NONREACTIVE

## 2020-12-27 NOTE — Progress Notes (Signed)
THERAPIST PROGRESS NOTE   Session Time: 3:00pm - 4:00pm  Location: Patient: [name] Therapist: [place] [facility]   Participation Level: Active   Behavioral Response: Alert, casually dressed, neatly groomed, anxious mood/affect   Type of Therapy: Individual Therapy    Treatment Goals addressed: Coping with anxiety and reducing panic attacks; Medication Compliance; Managing boundaries within relationships   Interventions: CBT, progressive muscle relaxation    Summary: [name] is a 35 year old male mixed race male that presented today for in-person therapy appointment with diagnosis of Panic Disorder.       Suicidal/Homicidal: None; without intent or plan.    Therapist Response:  Clinician met with Travis [name] for in person therapy session and assessed for safety, sobriety and medication compliance.  Travis [name] presented for today's appointment on time and was alert, oriented x5, with no evidence or self-report of active SI/HI or A/V H.  Travis [name] reported that he continues managing medication responsibly and denied any abuse of alcohol or illicit substances.  Clinician inquired about Travis [name]'s emotional ratings today, as well as any significant changes in thoughts, feelings or behavior since last check-in.  Travis [name] reported scores of 1/10 for depression, 2/10 for anxiety, and 0/10 for anger/irritability, but denied experiencing any reoccurrence of panic attacks.  Travis [name] reported that he had several successes over past week, including meeting with his doctor to discuss sleep medicine, discussing need for firm boundaries with his grandparents, and resolving a disagreement with his girlfriend using different communication approach.  Travis [name] reported that he has also lowered work hours to 40-45 weekly, and has been ensuring time for self-care such as taking the dog for walks, scheduling a massage, and eating out with friends for lunch today to catch up.  Travis [name] stated "I'm trying to be cognizant  of the fact that I don't have to fill every moment of my day with tasks".  Travis [name] reported that he would like to continue discussion on new relaxation techniques today to help manage stress. Clinician invited Travis [name] to try progressive muscle relaxation exercise today due to his report of tension in muscles which increases when he is experiencing periods of heightened stress. Travis [name] was agreeable to this, so clinician provided instructions on how to sequentially go through various muscle groups of the body (i.e. arms, legs, chest, back, etc), tensing muscles for 5 seconds, and then relaxing for 10 while maintaining relaxed breathing rhythm.  Intervention was effective, as evidenced by Travis [name] participating in exercise successfully and reporting that he felt more relaxed afterward in body and mind, and would consider adding this to self-care routine weekly.  Clinician provided him with a handout of instructions for future practice of exercise and will continue to monitor.     Plan: Meet again in 1 week.   Diagnosis: Panic Disorder.    [name], [place], LCAS 12/27/2020

## 2021-01-03 ENCOUNTER — Other Ambulatory Visit: Payer: Self-pay

## 2021-01-03 ENCOUNTER — Ambulatory Visit (INDEPENDENT_AMBULATORY_CARE_PROVIDER_SITE_OTHER): Payer: 59 | Admitting: Licensed Clinical Social Worker

## 2021-01-03 ENCOUNTER — Encounter (INDEPENDENT_AMBULATORY_CARE_PROVIDER_SITE_OTHER): Payer: Self-pay

## 2021-01-03 DIAGNOSIS — F410 Panic disorder [episodic paroxysmal anxiety] without agoraphobia: Secondary | ICD-10-CM | POA: Diagnosis not present

## 2021-01-03 NOTE — Progress Notes (Signed)
Virtual Visit via Video Note   I connected with Travis [name] on 01/03/21 at 2:30pm by video enabled telemedicine application and verified that I am speaking with the correct person using two identifiers.   I discussed the limitations, risks, security and privacy concerns of performing an evaluation and management service by video and the availability of in person appointments. I also discussed with the patient that there may be a patient responsible charge related to this service. The patient expressed understanding and agreed to proceed.   I discussed the assessment and treatment plan with the patient. The patient was provided an opportunity to ask questions and all were answered. The patient agreed with the plan and demonstrated an understanding of the instructions.   The patient was advised to call back or seek an in-person evaluation if the symptoms worsen or if the condition fails to improve as anticipated.   I provided 30 minutes of non-face-to-face time during this encounter.     [name], LCSW, LCAS _______________________________  THERAPIST PROGRESS NOTE   Session Time: 2:30pm - 3:00pm  Location: Patient: Patient Home Therapist: [facility]   Participation Level: Active   Behavioral Response: Alert, casually dressed, neatly groomed, anxious mood/affect   Type of Therapy: Individual Therapy    Treatment Goals addressed: Coping with anxiety and reducing panic attacks; Medication Compliance; Managing boundaries within relationships   Interventions: CBT, communication skills/boundaries   Summary: Soma Lizak is a 35 year old male mixed race male that presented today for therapy appointment with diagnosis of Panic Disorder.       Suicidal/Homicidal: None; without intent or plan.    Therapist Response:  Clinician met with [name] for virtual therapy appointment and assessed for safety, sobriety and medication compliance.  [name] informed clinician prior to  start of session that he would be late, as he had an unexpected business meeting to attend.  He presented for today's appointment at 2:30pm as a result, and apologized for tardiness.  He was alert, oriented x5, with no evidence or self-report of active SI/HI or A/V H.  Yosiel reported that ongoing compliance with medication and denied any abuse of alcohol or illicit substances.  Clinician inquired about Sandeep's current emotional ratings, as well as any significant changes in thoughts, feelings or behavior since previous check-in.  Kol reported scores of 1/10 for depression, 4/10 for anxiety, and 1/10 for anger/irritability, but denied experiencing any reoccurrence of panic attacks.  Jaquan reported that he has been very productive over last week, including balancing responsibilities between two jobs while still ensuring time for self-care activities.  He also reported that he allowed his girlfriend to come down to visit him from out of state following recent break, stating "I was a little nervous, but she has been keeping up with therapy and its been a few weeks, so I thought it would be fine".  Saron reported that she stayed at the house with him for 1 week, and the majority of this was uneventful, although there was one argument that occurred between them which concerned him, as he felt that she wasn't being honest with him.  Clinician assisted Harce in weighing the pros and cons of revisiting this conversation with his girlfriend at a later date to help him determine whether it would be better to worth bringing up again, or better left alone.  Denim reported that having her stay with him again made him reflect upon the current state of boundaries within the relationship too, as he  felt some pressure from her to begin spending more time again, more often, although he feels anxious thinking about this.  Clinician encouraged Aron to adjust boundaries in the relationship slowly, and base this upon  changes he sees in behavior/communication between him and the girlfriend in order to prioritize his mental health.  Interventions were effective, as evidenced by [name] reporting that this alleviated some of his anxiety, and helped him think more rationally about how to handle ongoing difficulties in the relationship.  Mehar stated "I'm gonna practice letting stuff go more often, and keep taking it slow because I don't think I'm ready to fully get involved with her again yet".  Clinician will continue to monitor.       Plan: Meet again in 1 week.   Diagnosis: Panic Disorder.    [name], [place], LCAS 01/03/2021

## 2021-01-04 ENCOUNTER — Telehealth (INDEPENDENT_AMBULATORY_CARE_PROVIDER_SITE_OTHER): Payer: Self-pay | Admitting: Emergency Medicine

## 2021-01-04 ENCOUNTER — Ambulatory Visit (INDEPENDENT_AMBULATORY_CARE_PROVIDER_SITE_OTHER): Payer: 59 | Admitting: Family Medicine

## 2021-01-04 NOTE — Telephone Encounter (Signed)
Left a msg on patient machine to return call and let us know if wed or thur is good for him appt for next week. Patient was informed even though he let me know yday to reschedule his appt till next week he would still get charged the no show visit due to he needed to give [name] a 48 hr noticed on canceling his appt. DR U stated she could still see him  4/6or 4/7 of next week and it could still be a virtual visit per DR Jearld Shines

## 2021-01-08 IMAGING — CR DG CHEST 2V
3 series · 3 of 3 positions shown · non-contrast
Comparison: None

CLINICAL DATA: Pt presents with c/o central chest pain that started
last night. Pt reports some mild shortness of breath as well. H/o
Asthma and HTN. Nonsmoker.

EXAM:
CHEST - 2 VIEW

[w chest pa]
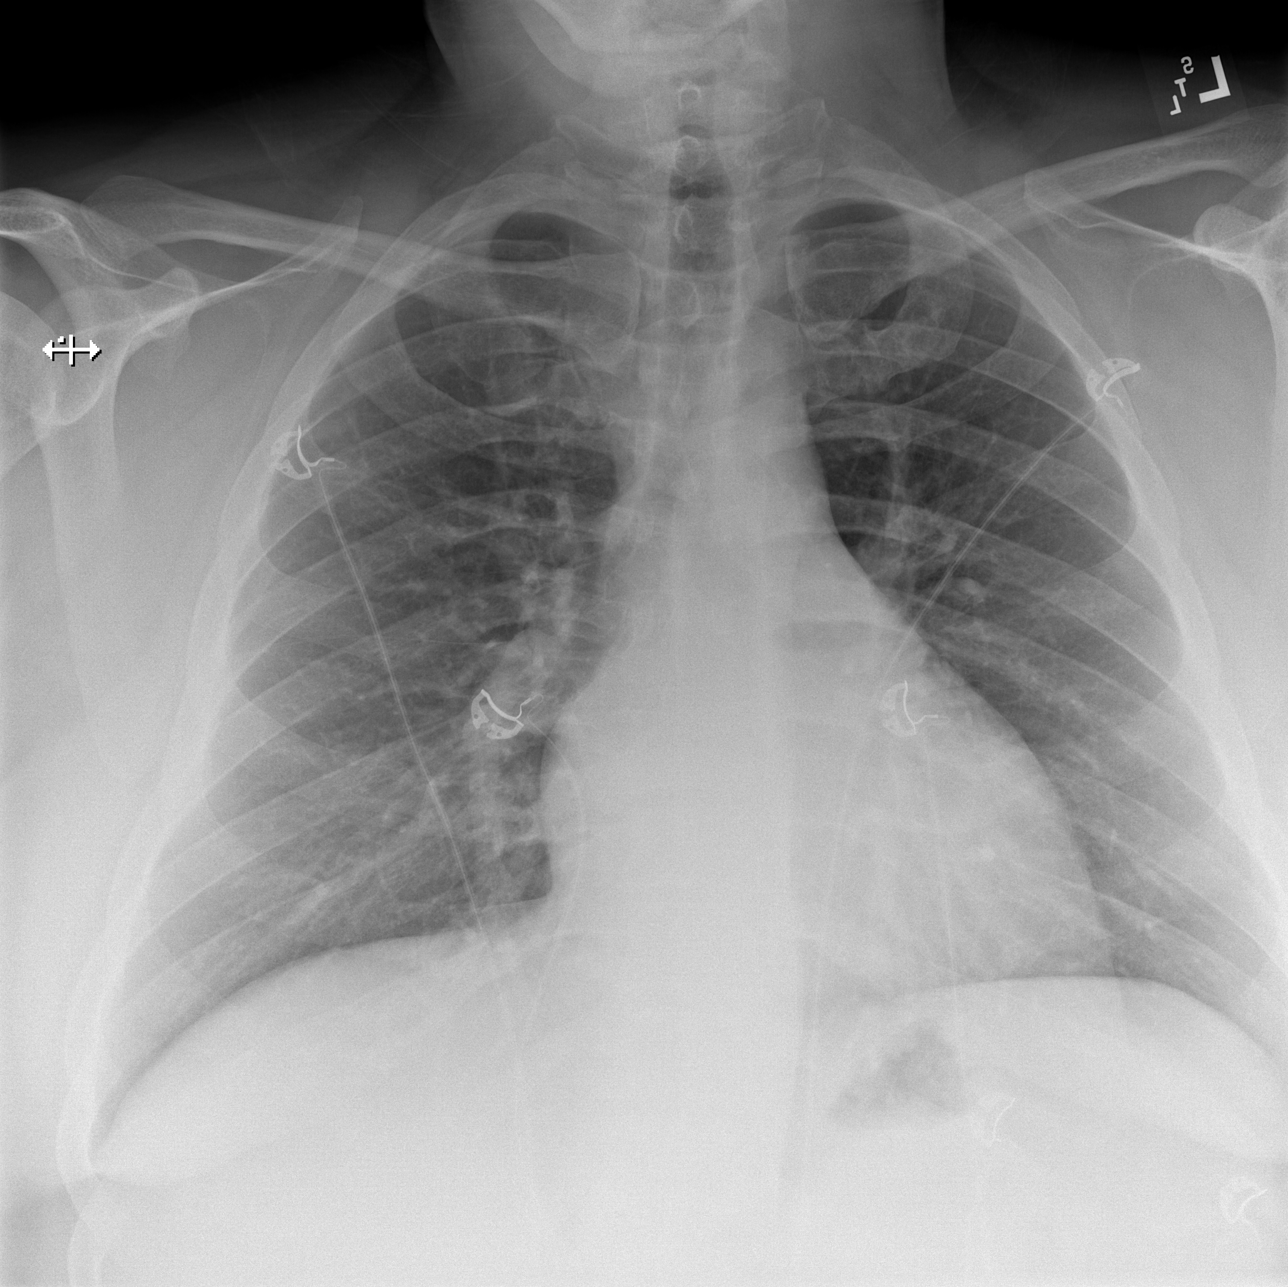

[w chest lat (1 of 2)]
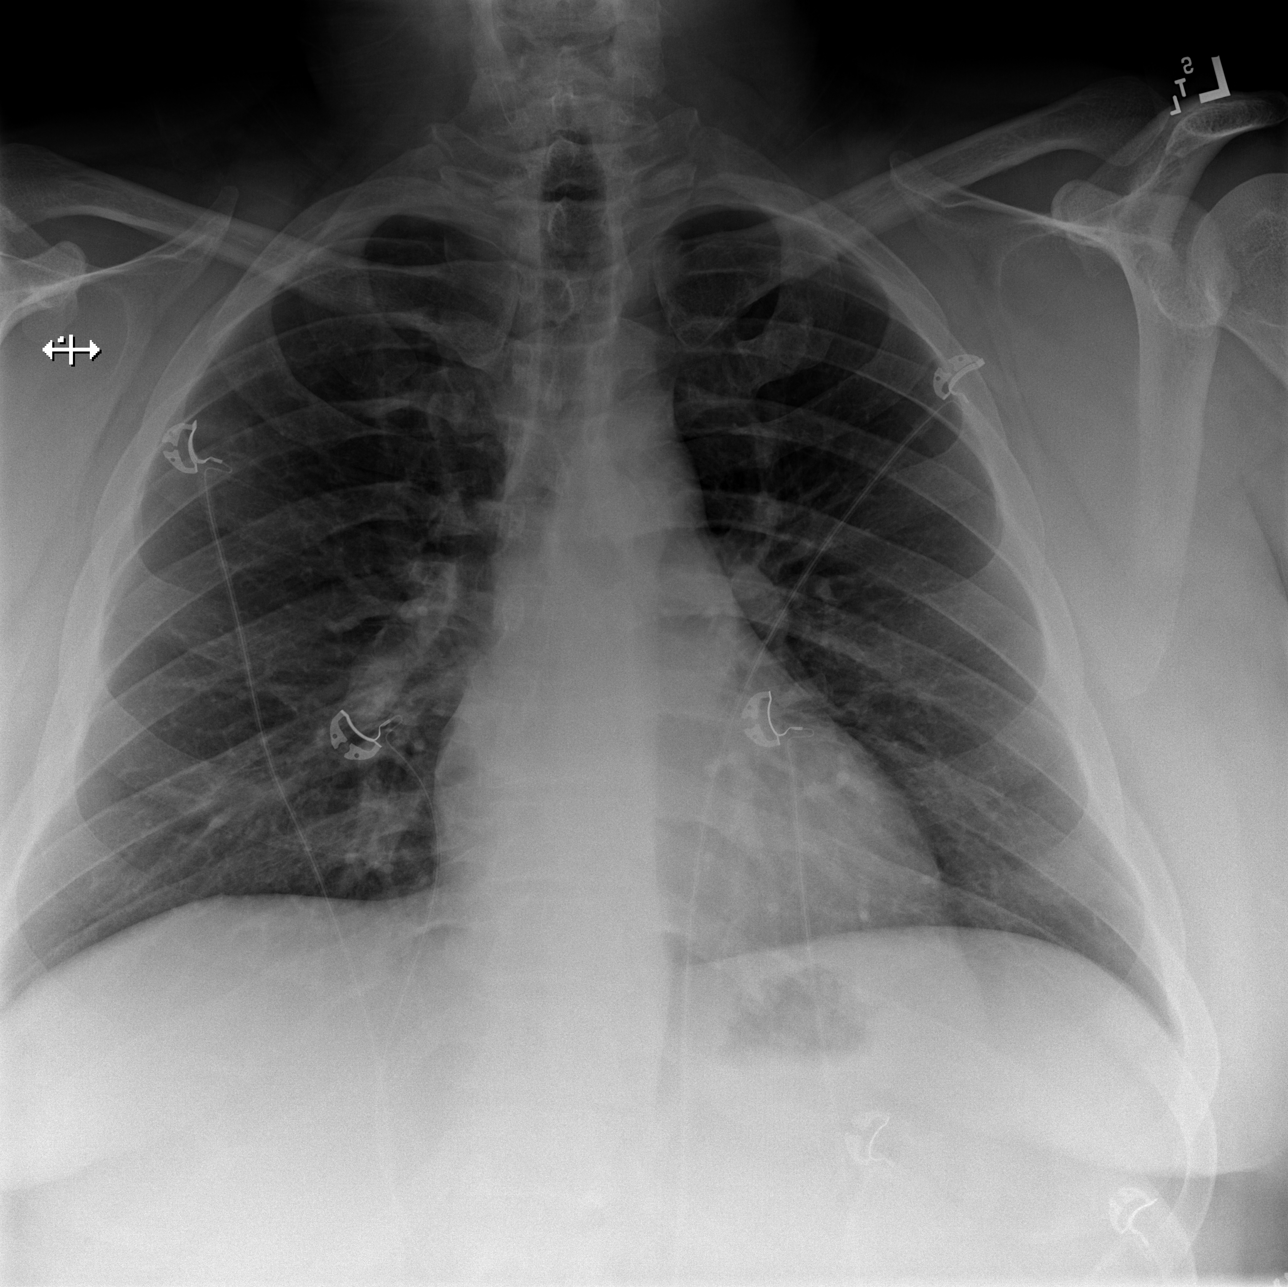

[w chest lat (2 of 2)]
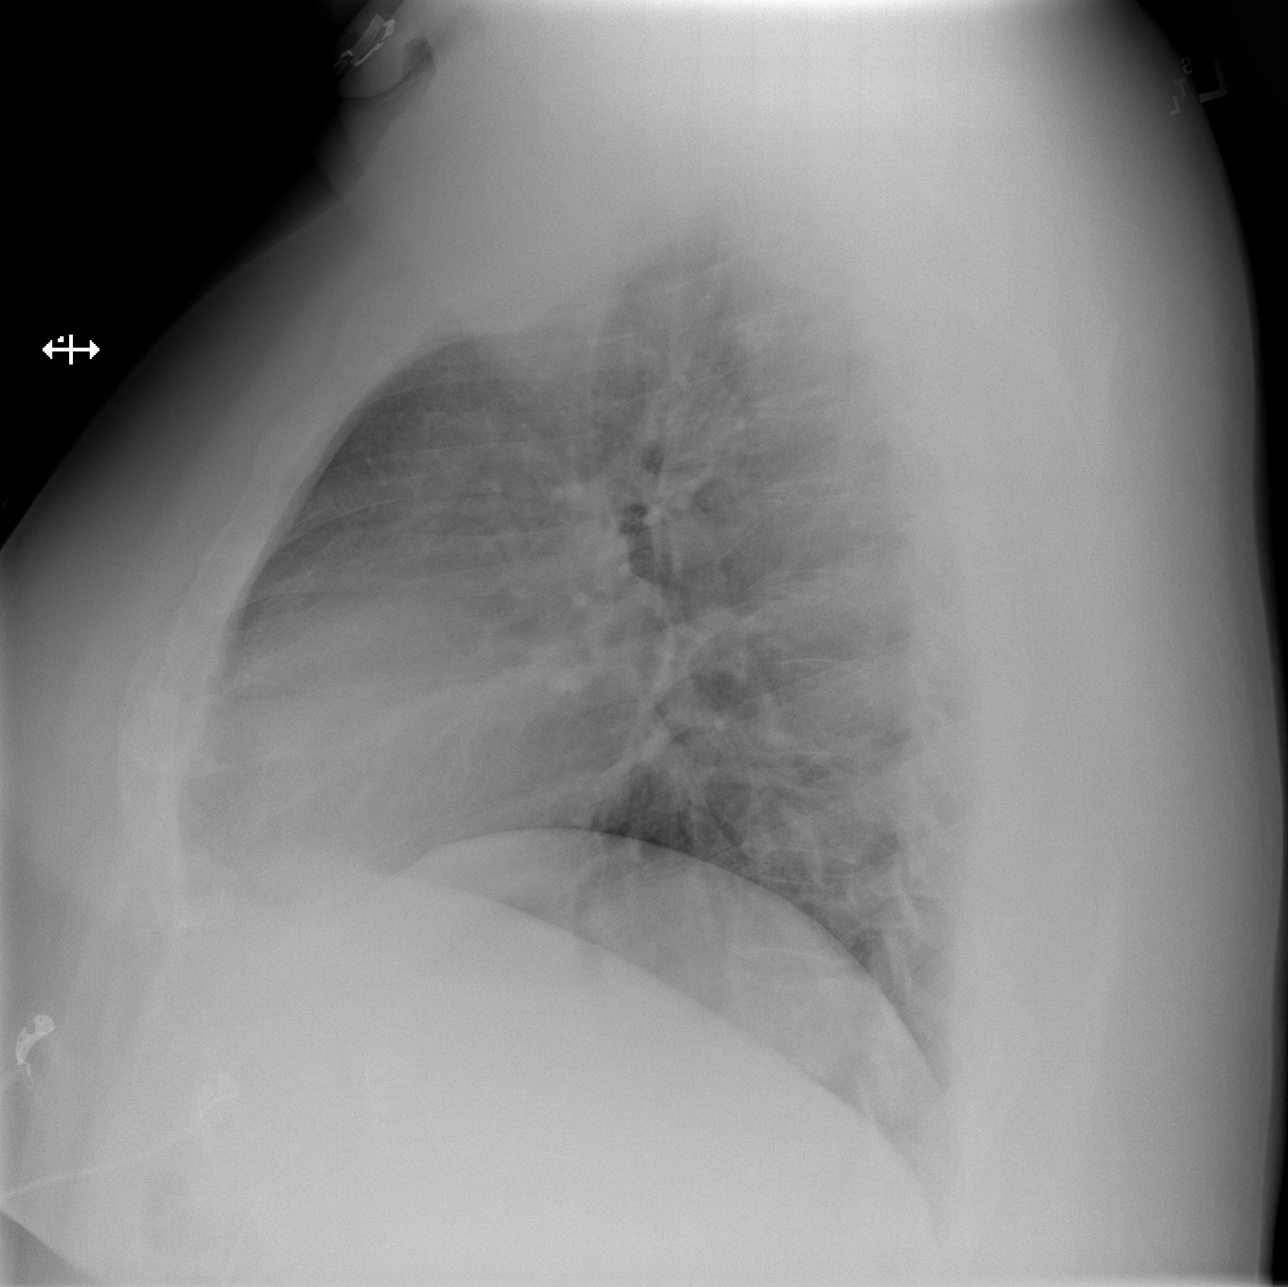

[3 of 3 positions shown; findings below may reference images not displayed]

FINDINGS: The heart size and mediastinal contours are within normal limits.
The lungs are clear. No pneumothorax or pleural effusion. The
visualized skeletal structures are unremarkable.
IMPRESSION: No active cardiopulmonary disease.

## 2021-01-10 ENCOUNTER — Other Ambulatory Visit: Payer: Self-pay

## 2021-01-10 ENCOUNTER — Ambulatory Visit (INDEPENDENT_AMBULATORY_CARE_PROVIDER_SITE_OTHER): Payer: 59 | Admitting: Licensed Clinical Social Worker

## 2021-01-10 DIAGNOSIS — F410 Panic disorder [episodic paroxysmal anxiety] without agoraphobia: Secondary | ICD-10-CM

## 2021-01-10 NOTE — Progress Notes (Signed)
Virtual Visit via Video Note   I connected with Travis [name] on 01/10/21 at 3:00pm by video enabled telemedicine application and verified that I am speaking with the correct person using two identifiers.   I discussed the limitations, risks, security and privacy concerns of performing an evaluation and management service by video and the availability of in person appointments. I also discussed with the patient that there may be a patient responsible charge related to this service. The patient expressed understanding and agreed to proceed.   I discussed the assessment and treatment plan with the patient. The patient was provided an opportunity to ask questions and all were answered. The patient agreed with the plan and demonstrated an understanding of the instructions.   The patient was advised to call back or seek an in-person evaluation if the symptoms worsen or if the condition fails to improve as anticipated.   I provided 1 hour of non-face-to-face time during this encounter.     [name], LCSW, LCAS _______________________________  THERAPIST PROGRESS NOTE   Session Time: 3:00pm - 4:00pm  Location: Patient: Patient Home Therapist: [facility]   Participation Level: Active   Behavioral Response: Alert, casually dressed, neatly groomed, anxious mood/affect   Type of Therapy: Individual Therapy    Treatment Goals addressed: Coping with anxiety and reducing panic attacks; Medication Compliance; Managing boundaries within relationships   Interventions: CBT, communication skills   Summary: Travis [name] is a 35 year old male mixed race male that presented today for therapy appointment with diagnosis of Panic Disorder.       Suicidal/Homicidal: None; without intent or plan.    Therapist Response:  Clinician met with Travis [name] for virtual therapy session and assessed for safety, sobriety and medication compliance.  Travis [name] presented for today's appointment on time and was  alert, oriented x5, with no evidence or self-report of active SI/HI or A/V H.  Travis [name] reported that he continues taking medication responsibly and denied any abuse of alcohol or illicit substances.  Clinician inquired about Travis [name]'s emotional ratings today, as well as any significant changes in thoughts, feelings or behavior since last check-in.  Travis [name] reported scores of 2/10 for depression, 2/10 for anxiety, and 2/10 for irritability, but denied experiencing any reoccurrence of panic attacks.  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  Clinician reviewed material focused upon 'soft startup' communication skills with Travis [name] that could be utilized to properly express his need for space and included tips such as choosing an appropriate setting and time, being mindful of mood/tone, focusing on one issue at a time, using "I" statements to avoid blaming, and being respectful.  Clinician also provided Travis [name] with time in session to roleplay this conversation in order to increase confidence and practice skills with feedback.  Intervention was effective, as evidenced by Travis [name] demonstrating use of techniques taught in session during exercise and reporting increased confidence in ability to hold this difficult conversation with his ex, stating "I think I'm going to wait till she comes back from her trip because that would be a better time, give me a little more time to prepare, and rehearse what I need to get off my chest".  Clinician will continue to monitor.       Plan: Meet again  in 1 week.   Diagnosis: Panic Disorder.    [name], LCSW, LCAS 01/10/2021

## 2021-01-16 ENCOUNTER — Ambulatory Visit (INDEPENDENT_AMBULATORY_CARE_PROVIDER_SITE_OTHER): Payer: 59 | Admitting: Licensed Clinical Social Worker

## 2021-01-16 ENCOUNTER — Other Ambulatory Visit: Payer: Self-pay

## 2021-01-16 ENCOUNTER — Ambulatory Visit (HOSPITAL_COMMUNITY): Payer: 59 | Admitting: Licensed Clinical Social Worker

## 2021-01-16 DIAGNOSIS — F410 Panic disorder [episodic paroxysmal anxiety] without agoraphobia: Secondary | ICD-10-CM

## 2021-01-16 NOTE — Progress Notes (Signed)
THERAPIST PROGRESS NOTE   Session Time: 2:00pm - 3:00pm  Location: Patient: [facility] Therapist: [facility]   Participation Level: Active   Behavioral Response: Alert, casually dressed, neatly groomed, anxious mood/affect   Type of Therapy: Individual Therapy    Treatment Goals addressed: Coping with anxiety and reducing panic attacks; Medication Compliance; Managing boundaries within relationships; Sleep hygiene    Interventions: CBT, communication skills, treatment planning    Summary: Travis [name] is a 35 year old male mixed race male that presented today for therapy appointment with diagnosis of Panic Disorder.       Suicidal/Homicidal: None; without intent or plan.    Therapist Response:  Clinician met with [name] for in person therapy session and assessed for safety, sobriety and medication compliance.  Elman presented for today's appointment on time and was alert, oriented x5, with no evidence or self-report of active SI/HI or A/V H.  Jessie reported that he continues taking medication responsibly and denied any abuse of alcohol or illicit substances.  Clinician inquired about Tayari's emotional ratings today, as well as any significant changes in thoughts, feelings or behavior since last check-in.  Americo reported scores of 2/10 for depression, 5/10 for anxiety, and 0/10 for anger/irritability, but denied any reoccurrence of panic attacks. Amen reported that he has not been sleeping well despite recent change in medication and implementation of some sleep hygiene techniques.  He stated "I'm only getting about 5 hours a night and don't wake up well rested.  I've set up an appointment to talk to my doctor though".  Clinician inquired about present stressors in Basir's life which could be contributing to sleeplessness at night and increased anxiety.  Tylerjames reported that he still hasn't officially separated from former partner, and feels very guilty about this,  stating "I think I'm worried about being alone and need to learn to be by myself".  Clinician assisted Wheeler in practicing previous discussed 'soft startup' communication skills in order to assertively address this issue with his partner within the week and increase confidence in ability to properly express his need for space.  Clinician also revisited treatment plan with Jonuel today and received verbal consent to make revisions as follows: Meet with clinician for therapy sessions x1 per week to address progress towards goals, as well as barriers to success;  Follow up with psychiatrist x1 per month to address efficacy of medication and make changes to dose/regimen as needed; Take medications as prescribed each day to aid in symptom reduction and improvement of daily functioning;  Reduce average anxiety from 3/10 in severity down to 1/10 and maintain frequency of panic attacks at 0 per month over next 90 days by practicing 3-4 relaxation and/or grounding skills daily when related symptoms arise, including mindful breathing, progressive muscle relaxation, 5-4-3-2-1 grounding, etc; Set aside 30 minutes per day for positive self-care activities in order to ensure healthy work/life balance each week and adequate time to relax outside of busy schedule; Continue documenting details of panic episodes in journal entries as they arise each week in order to increase insight into triggers that could be influencing mood instability, as well as weighing effectiveness of various techniques employed during crisis; Weigh costs and benefits of engaging in romantic relationships at this time versus focusing on one's own mental health and needs; Commit to to exercising x3 per week for 30 minutes in order to improve physical and mental fitness, in addition to confidence; Implement 4-5 sleep hygiene techniques in order to  increase average nightly rest from 5 hours up to 8 in next 90 days and reduce related fatigue/irritability; Show  increased willingness to engage in travel opportunities in order to curb related anxiety level in these scenarios from 7/10 down to a 5/10 and avoid isolation; Voluntarily seek hospitalization with assistance from support system and/or medical professionals should SI/HI appear and safety of self or others is determined at risk due to development of intent, plan, and access to means necessary to carry out plan.  Interventions were effective, as evidenced by [name] reporting that this session helped him gain more confidence in his ability to officially separate from partner to improve boundaries, in addition to redefining treatment goals for months ahead and identify progress in areas such as anxiety management.  Rodd stated "I think I probably need to go ahead and let her know about what's going on because it has been a mental burden for me to keep this going when my hearts not in it.  I do feel good about the progress I've made so far on my goals though".  Clinician will continue to monitor.       Plan: Meet again in 1 week.   Diagnosis: Panic Disorder.    [name], [place], LCAS 01/16/2021

## 2021-01-22 ENCOUNTER — Ambulatory Visit (INDEPENDENT_AMBULATORY_CARE_PROVIDER_SITE_OTHER): Payer: 59 | Admitting: Licensed Clinical Social Worker

## 2021-01-22 ENCOUNTER — Other Ambulatory Visit: Payer: Self-pay

## 2021-01-22 DIAGNOSIS — F410 Panic disorder [episodic paroxysmal anxiety] without agoraphobia: Secondary | ICD-10-CM

## 2021-01-22 NOTE — Progress Notes (Signed)
THERAPIST PROGRESS NOTE   Session Time: 10:00am - 11:00am  Location: Patient: [facility] Therapist: [facility]   Participation Level: Active   Behavioral Response: Alert, casually dressed, neatly groomed, anxious mood/affect    Type of Therapy: Individual Therapy    Treatment Goals addressed: Coping with anxiety and reducing panic attacks; Medication Compliance; Managing boundaries within relationships   Interventions: CBT, healthy relationship boundaries    Summary: Travis [name] is a 35 year old male mixed race male that presented today for therapy appointment with diagnosis of Panic Disorder.       Suicidal/Homicidal: None; without intent or plan.    Therapist Response:  Clinician met with [name] for in person therapy appointment and assessed for safety, sobriety and medication compliance.  [name] presented for today's session on time and was alert, oriented x5, with no evidence or self-report of active SI/HI or A/V H.  Yurem reported ongoing compliance with medication and denied any abuse of alcohol or illicit substances.  Clinician inquired about [name]'s current emotional ratings, as well as any significant changes in thoughts, feelings or behavior since previous check-in.  [name] reported scores of 2/10 for depression, 4/10 for anxiety, and 0/10 for anger/irritability, but denied any reoccurrence of panic attacks. [name] reported that one recent success was following through with decision to officially break up with his girlfriend after previous session, stating "I realized that I was just consumed with anxiety and stress and had to do it.  I felt relief afterward because it was just getting to be too much".  [name] reported that he initially felt happier since then, but anxiety is still present because now he is focused upon a new potential partner he has been spending time with, and things appear to be progressing quickly.  Clinician utilized a handout with [name]  focused upon establishing healthy relationship boundaries when meeting new partners in order to avoid potentially substituting one stressor for another.  Clinician also assisted [name] in weighing costs and benefits of entering into a relationship immediately with a new partner following recent separation, given his goal of focusing more on his personal wellbeing and growth during treatment.  Interventions were effective, as evidenced by [name] reporting that this conversation helped him realize that his fear of abandonment is preventing him from acting rationally at this time, which could promote moving too quickly with a new relationship and amplify overall day to day stress.  [name] stated "My gut instinct is telling me this is not the right time for a relationship and I need to pump the breaks so I still have time for my self-care.  Tonight when I see her I'm going to be honest and tell her we need to slow things down before it gets out of hand".  Clinician will continue to monitor.       Plan: Meet again in 1 week.   Diagnosis: Panic Disorder.    [name], [place], LCAS 01/22/2021

## 2021-01-29 ENCOUNTER — Other Ambulatory Visit: Payer: Self-pay

## 2021-01-29 ENCOUNTER — Ambulatory Visit (INDEPENDENT_AMBULATORY_CARE_PROVIDER_SITE_OTHER): Payer: 59 | Admitting: Licensed Clinical Social Worker

## 2021-01-29 DIAGNOSIS — F410 Panic disorder [episodic paroxysmal anxiety] without agoraphobia: Secondary | ICD-10-CM

## 2021-01-29 NOTE — Progress Notes (Signed)
Virtual Visit via Video Note   I connected with Travis [name] on 01/29/21 at 4:00pm by video enabled telemedicine application and verified that I am speaking with the correct person using two identifiers.   I discussed the limitations, risks, security and privacy concerns of performing an evaluation and management service by video and the availability of in person appointments. I also discussed with the patient that there may be a patient responsible charge related to this service. The patient expressed understanding and agreed to proceed.   I discussed the assessment and treatment plan with the patient. The patient was provided an opportunity to ask questions and all were answered. The patient agreed with the plan and demonstrated an understanding of the instructions.   The patient was advised to call back or seek an in-person evaluation if the symptoms worsen or if the condition fails to improve as anticipated.   I provided 35 minutes of non-face-to-face time during this encounter.     Travis Flood, LCSW, LCAS _______________________________  THERAPIST PROGRESS NOTE   Session Time: 4:00pm - 4:35pm  Location: Patient: [facility] Home Therapist: [facility]   Participation Level: Active   Behavioral Response: Alert, casually dressed, neatly groomed, anxious mood/affect   Type of Therapy: Individual Therapy    Treatment Goals addressed: Coping with anxiety and reducing panic attacks; Medication Compliance; Managing boundaries within relationships   Interventions: CBT, assertive communication and boundaries   Summary: Travis [name] is a 35 year old male mixed race male that presented today for therapy appointment with diagnosis of Panic Disorder.       Suicidal/Homicidal: None; without intent or plan.    Therapist Response:  Clinician met with Travis [name] for virtual therapy appointment and assessed for safety, sobriety and medication compliance.  Travis [name] presented for today's  session on time and was alert, oriented x5, with no evidence or self-report of active SI/HI or A/V H.  Travis [name] reported ongoing compliance with medication and denied any abuse of alcohol or illicit substances.  Clinician inquired about Travis [name]'s emotional ratings today, as well as any significant changes in thoughts, feelings or behavior since last check-in.  Travis [name] reported scores of 0/10 for depression, 2/10 for anxiety, and 0/10 for anger/irritability, but denied any reoccurrence of panic attacks.  Travis [name] reported that his sleep has been improving following recent medication change with doctor.  He reported that the past week has been enjoyable, as he went on a trip with his uncle and grandfather to the beach and did some fishing, stating "I really enjoyed it, and think it helped eliminate some of the anxiety I've had against travelling".  Travis [name] also reported that despite discussion in previous session about taking things slowly with new romantic partner, they have begun dating, and stated "I guess I'm a weak guy, but I did share my reservations and we agreed to take things easy and stay honest and open with our feelings".  He reported that the situation has been further complicated by meeting a different woman at a recent event and feeling pressure from her to go on a date this week, which has left him confused about whether to follow through.  Clinician assisted Travis [name] in running cost benefit analysis of going on this date, and the consequences that could result from allowing things to progress further while already focusing energy upon one new partner.  Travis [name] actively participated in analysis and noted that consequences of pursuing this person could include making him feel guilty, stressed, impact his moral code,  and possibly do harm to new relationship, so he will plan to outreach her by text this week and cancel, although he was uncertain how to turn her down gently.  Clinician assisted Travis [name] in  developing an effective, respectful message to express his feelings on why this isn't a good idea in order to improve assertive skills and ability to establish boundaries successfully.  Interventions were effective, as evidenced by Travis [name] reporting that this session helped him plan ahead to reduce unnecessary relationship stressors, as well as further reinforcing assertive communication skills, stating "I think letting the situation go along any further is a recipe for disaster and talking about this today helped me figure out what's best for me in the long run and how to express myself".  Clinician will continue to monitor.       Plan: Meet again in 1 week.   Diagnosis: Panic Disorder.    Travis [name], [place], LCAS 01/29/2021

## 2021-02-05 ENCOUNTER — Ambulatory Visit (HOSPITAL_COMMUNITY): Payer: 59 | Admitting: Licensed Clinical Social Worker

## 2021-02-05 ENCOUNTER — Other Ambulatory Visit: Payer: Self-pay

## 2021-02-06 ENCOUNTER — Other Ambulatory Visit: Payer: Self-pay

## 2021-02-06 ENCOUNTER — Ambulatory Visit (INDEPENDENT_AMBULATORY_CARE_PROVIDER_SITE_OTHER): Payer: 59 | Admitting: Licensed Clinical Social Worker

## 2021-02-06 DIAGNOSIS — F410 Panic disorder [episodic paroxysmal anxiety] without agoraphobia: Secondary | ICD-10-CM | POA: Diagnosis not present

## 2021-02-06 NOTE — Progress Notes (Signed)
THERAPIST PROGRESS NOTE   Session Time: 8:00am - 9:00am   Location: Patient: [facility] Therapist: [facility]   Participation Level: Active   Behavioral Response: Alert, casually dressed, neatly groomed, irritable mood/affect   Type of Therapy: Individual Therapy    Treatment Goals addressed: Coping with anxiety and reducing panic attacks; Medication Compliance; Managing boundaries within relationships   Interventions: CBT, anger management, communication skills   Summary: Sammy Cassar is a 35 year old male mixed race male that presented today for therapy appointment with diagnosis of Panic Disorder.       Suicidal/Homicidal: None; without intent or plan.    Therapist Response:  Clinician met with [name] for in-person therapy session and assessed for safety, sobriety and medication compliance.  Jagar presented for today's appointment on time and was alert, oriented x5, with no evidence or self-report of active SI/HI or A/V H.  Laakea reported that he continues taking medication responsibly and denied any abuse of alcohol or illicit substances.  Clinician inquired about Kazi's current emotional ratings, as well as any significant changes in thoughts, feelings or behavior since previous check-in.  Joshia reported scores of 1/10 for depression, 3/10 for anxiety, and 1/10 for irritability, but denied any reoccurrence of panic attacks.  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  Clinician utilized a handout with [place] today to assist with anger management.  This handout involved identifying his particular triggers by category (I.e. emotional states, people, places, things, thoughts, and activities/situations), as well as developing strategies for coping with major ones  via avoiding/reducing exposure, or facing them directly if necessary.  Brighten participated in activity, reporting that some of his major triggers include emotional states such as frustration, exasperation, nervousness, and helplessness, being in stressful places like his workplace, dealing with people that are unreliable, immature, stubborn, or self-centered, and ruminating upon thoughts like "Why don't they understand me?" or "Are they not listening to me?".  [name] was also able to identify ways to reduce exposure to triggering individuals such as the cousin via effective delegation of work duties to establish boundary, and alternative approach for dealing directly with him when needed, stating "I think rehearsed responses would be good, because its hard to tell people no and then I let them walk over me".  Interventions were effective, as evidenced by [name] stating "I think its helpful to identify the things that are triggering for me and solve the issue of how to deal with them.  I could also listen more, and avoid setting the bar so high for other people because I could try to meet them where they're at, and work from there.  I'm looking at this in a different light, and seeing how my behavior plays into problems too".  Clinician will continue to monitor.  Plan: Meet again in 1 week.   Diagnosis: Panic Disorder.    [name], [place], LCAS 02/06/2021

## 2021-02-12 ENCOUNTER — Ambulatory Visit (INDEPENDENT_AMBULATORY_CARE_PROVIDER_SITE_OTHER): Payer: 59 | Admitting: Licensed Clinical Social Worker

## 2021-02-12 ENCOUNTER — Other Ambulatory Visit: Payer: Self-pay

## 2021-02-12 DIAGNOSIS — F410 Panic disorder [episodic paroxysmal anxiety] without agoraphobia: Secondary | ICD-10-CM | POA: Diagnosis not present

## 2021-02-12 NOTE — Progress Notes (Signed)
THERAPIST PROGRESS NOTE   Session Time: 4:09pm - 5:00pm   Location: Patient: [facility] Therapist: [facility]   Participation Level: Active   Behavioral Response: Alert, casually dressed, neatly groomed, anxious mood/affect   Type of Therapy: Individual Therapy    Treatment Goals addressed: Coping with anxiety and reducing panic attacks; Medication Compliance; Managing boundaries within relationships   Interventions: CBT, assertive communication skills   Summary: Travis [name] is a 35 year old male mixed race male that presented today for therapy appointment with diagnosis of Panic Disorder.       Suicidal/Homicidal: None; without intent or plan.    Therapist Response:  Clinician met with Travis [name] for in-person therapy appointment and assessed for safety, sobriety and medication compliance.  Travis [name] presented for today's session 9 minutes late, but was alert, oriented x5, with no evidence or self-report of active SI/HI or A/V H.  Travis [name] reported ongoing compliance with medication and denied any abuse of alcohol or illicit substances.  Clinician inquired about Travis [name]'s emotional ratings today, as well as any significant changes in thoughts, feelings or behavior since last check-in.  Travis [name] reported scores of 1/10 for depression, 3/10 for anxiety, and 0/10 for anger/irritability, but denied any reoccurrence of panic attacks.  Travis [name] reported that he has been very busy with work lately, and this was why he was running late today.  He reported that his anxiety is up because he has become increasingly unhappy with his friend staying at his house, stating "I had originally told him he could stay until late July or August and I've realized that its just not [place] work for me anymore".  Travis [name] reported that this subject came up the other day, but he was unable to speak his mind, stating "I know I need to tell him at some point because I need Travis [name] space to decompress after long days at work".    Clinician reviewed material with Travis [name] centered on utilizing assertive communication skills in order to properly express his personal needs and establish appropriate boundaries within support network.  Clinician offered opportunity in this session for Travis [name] to participate in roleplay activity with clinician serving as the roommate in order to increase his competency with these skills, as well as confidence in ability to speak his mind.  Interventions were effective, as evidenced by Travis [name] participating in roleplay and demonstrating effective use of communication skills, noting that he will plan to speak with his roommate this week about an appropriate timeline for moving out.  Travis [name] stated "I think it brought me clarity that I need to take care of myself, and learn how to be happy alone.  This helped me prepare for some difficult conversations".  Clinician will continue to monitor.   Plan: Meet again in 1 week.   Diagnosis: Panic Disorder.    [name], [place], LCAS 02/12/2021

## 2021-02-19 ENCOUNTER — Ambulatory Visit (INDEPENDENT_AMBULATORY_CARE_PROVIDER_SITE_OTHER): Payer: 59 | Admitting: Licensed Clinical Social Worker

## 2021-02-19 ENCOUNTER — Other Ambulatory Visit: Payer: Self-pay

## 2021-02-19 DIAGNOSIS — F410 Panic disorder [episodic paroxysmal anxiety] without agoraphobia: Secondary | ICD-10-CM

## 2021-02-19 NOTE — Progress Notes (Signed)
Virtual Visit via Video Note   I connected with Travis [name] on 02/19/21 at 3:20pm by video enabled telemedicine application and verified that I am speaking with the correct person using two identifiers.   I discussed the limitations, risks, security and privacy concerns of performing an evaluation and management service by video and the availability of in person appointments. I also discussed with the patient that there may be a patient responsible charge related to this service. The patient expressed understanding and agreed to proceed.   I discussed the assessment and treatment plan with the patient. The patient was provided an opportunity to ask questions and all were answered. The patient agreed with the plan and demonstrated an understanding of the instructions.   The patient was advised to call back or seek an in-person evaluation if the symptoms worsen or if the condition fails to improve as anticipated.   I provided 40 minutes of non-face-to-face time during this encounter.     [name], LCSW, LCAS _______________________________  THERAPIST PROGRESS NOTE   Session Time: 3:20pm - 4:00pm   Location: Patient: Patient Home Therapist: [facility]   Participation Level: Active   Behavioral Response: Alert, casually dressed, neatly groomed, depressed mood/affect   Type of Therapy: Individual Therapy    Treatment Goals addressed: Coping with anxiety and reducing panic attacks; Medication Compliance; Managing boundaries within relationships   Interventions: CBT, self-care activities    Summary: Travis [name] is a 35 year old male mixed race male that presented today for therapy appointment with diagnosis of Panic Disorder.       Suicidal/Homicidal: None; without intent or plan.    Therapist Response:  Clinician met with Travis [name] for virtual therapy appointment 20 minutes later than planned due to error in scheduling this morning.  Clinician assessed for safety,  sobriety and medication compliance.  Travis [name] presented for today's session on time and was alert, oriented x5, with no evidence or self-report of active SI/HI or A/V H.  Travis [name] reported ongoing compliance with medication and denied any abuse of alcohol or illicit substances.  Clinician inquired about Travis [name]'s emotional ratings today, as well as any significant changes in thoughts, feelings or behavior since last check-in.  Travis [name] reported scores of 2/10 for depression, 1/10 for anxiety, and 0/10 for anger/irritability, but denied any reoccurrence of panic attacks.  Travis [name] reported that the past week was difficult due to making the decision to put his current relationship on hold, and asking roommate to move out by the end of the month so he has more space for himself at home.  Travis [name] reported that he also informed his previous partner that they could not rekindle the relationship, and this led him to fall into a brief depressive period, stating "It was really rough for a few days there".  Travis [name] reported that following these social stressors, he realized that he needs to include more time in schedule for self-care, stating "I struggle to find things that make me happy and need more ideas".  Clinician assisted Travis [name] with this goal by offering various ideas from a comprehensive list of positive, self-care activities that he could include in schedule, with examples such as engaging in physical activities like taking a walk in the park, cycling, or yoga, finding an engrossing book to read at [employer], doing word or jigsaw puzzles, picking up a new hobby like photography, and more.  Intervention was effective, as evidenced by Travis [name] expressing receptiveness to several of these ideas, with plans  to include activities into his self-care routine in upcoming weeks such as planning a trip to [place] with a cousin, doing bird watching along with photography, visiting the [employer], volunteering at a local non profit  or church, looking into [employer], or putting together model planes.  Travis [name] stated "There is a lot of good stuff here that I wouldn't have thought of on my own".  Clinician will continue to monitor.       Plan: Meet again in 1 week.   Diagnosis: Panic Disorder.    [name], [place], LCAS 02/19/2021

## 2021-02-27 ENCOUNTER — Ambulatory Visit (INDEPENDENT_AMBULATORY_CARE_PROVIDER_SITE_OTHER): Payer: 59 | Admitting: Licensed Clinical Social Worker

## 2021-02-27 ENCOUNTER — Other Ambulatory Visit: Payer: Self-pay

## 2021-02-27 DIAGNOSIS — F410 Panic disorder [episodic paroxysmal anxiety] without agoraphobia: Secondary | ICD-10-CM | POA: Diagnosis not present

## 2021-02-27 NOTE — Progress Notes (Signed)
THERAPIST PROGRESS NOTE   Session Time: 8:00am - 9:00am    Location: Patient: [facility] Therapist: [facility]   Participation Level: Active   Behavioral Response: Alert, casually dressed, neatly groomed, anxious mood/affect   Type of Therapy: Individual Therapy    Treatment Goals addressed: Coping with anxiety and reducing panic attacks; Medication Compliance; Managing boundaries within relationships   Interventions: CBT, problem solving, healthy boundaries   Summary: Othon Guardia is a 35 year old male mixed race male that presented today for therapy appointment with diagnosis of Panic Disorder.       Suicidal/Homicidal: None; without intent or plan.    Therapist Response:  Clinician met with [name] for in-person therapy session and assessed for safety, sobriety and medication compliance.  Daymian presented for today's appointment on time and was alert, oriented x5, with no evidence or self-report of active SI/HI or A/V H.  Larenz reported that he continues taking medication responsibly and denied any abuse of alcohol or illicit substances.  Clinician inquired about Dejohn's current emotional ratings, as well as any significant changes in thoughts, feelings or behavior since previous check-in.  Nasif reported scores of 2/10 for depression, 2/10 for anxiety, and 0/10 for anger/irritability, but denied any reoccurrence of panic attacks.  Blane reported that over the past week he has been setting aside time for more self-care, including going skeet shooting with some friends over the weekend and attending an arena football game.  Isais reported that he did experience some initial symptoms of a panic attack, but used deep breathing and distraction to avoid having an episode. Emerald reported that he believes it was triggered by being in a different setting than he was used to, in addition to a large crowd of unfamiliar people, so he eventually left, listened to relaxing music,  and took a nap to relax.  He also reported that he is looking into drum lessons as an additional outlet for stress. Ziyad reported that one stressor has been trying to decide what to do about his elderly dog, as its mobility has greatly decreased and has made him consider euthanasia.  Clinician assisted Brandol in exploring local resources via the internet that are focused upon elderly dog care and end of life planning/hospice so that he can receive professional counsel on how to approach this concern. Zacari was receptive to several of these resources and made notes in his phone on some of the companies to outreach in upcoming days, stating "I think talking to someone with help me with this decision and take some weight off my shoulders".  Olumide reported that an additional challenge has been trying to decide whether or not to make contact with his mother, as he visited grandparents over past week, and narrowly missed encountering her as she left.  Bjorn stated "I just want to get things off my chest and not have this burden I'm carrying anymore".  Clinician assisted Sidharth in weighing the costs and benefits of this decision as to whether he should contact his mother based upon previous history of negative interactions and potential negative impact on mood.  Ranson participated in this analysis and reported that unlike previous discussions on this decision, he feels more confident that this will go more favorably, strengthen overall support from the family, and prevent him from having regrets later, stating "I need to learn to work through problems with people instead of just cutting them off".  Interventions were effective, as evidenced by [name] stating "It was  helpful to talk about what to do with my dog and explore options to address his health.  I'm also going to talk to my mom soon, but first I need to journal about it some more, talk to my family, and let my anxiety balance out a bit more so this  doesn't just fuel another panic attack".  Clinician will continue to monitor.        Plan: Meet again in 1 week.   Diagnosis: Panic Disorder.    [name], [place], LCAS 02/27/2021

## 2021-03-07 ENCOUNTER — Other Ambulatory Visit: Payer: Self-pay

## 2021-03-07 ENCOUNTER — Ambulatory Visit (INDEPENDENT_AMBULATORY_CARE_PROVIDER_SITE_OTHER): Payer: 59 | Admitting: Licensed Clinical Social Worker

## 2021-03-07 DIAGNOSIS — F410 Panic disorder [episodic paroxysmal anxiety] without agoraphobia: Secondary | ICD-10-CM

## 2021-03-07 NOTE — Progress Notes (Signed)
THERAPIST PROGRESS NOTE   Session Time: 8:00am - 9:00am    Location: Patient: [facility] Therapist: [facility]   Participation Level: Active   Behavioral Response: Alert, casually dressed, neatly groomed, depressed mood/affect   Type of Therapy: Individual Therapy    Treatment Goals addressed: Coping with anxiety and reducing panic attacks; Medication Compliance   Interventions: CBT, stages of grief and how to cope with loss   Summary: Travis [name] is a 35 year old male mixed race male that presented today for therapy appointment with diagnosis of Panic Disorder.       Suicidal/Homicidal: None; without intent or plan.    Therapist Response:  Clinician met with Travis [name] for in-person therapy appointment and assessed for safety, sobriety and medication compliance.  Travis [name] presented for today's session on time and was alert, oriented x5, with no evidence or self-report of active SI/HI or A/V H.  Travis [name] reported ongoing compliance with medication and denied any abuse of alcohol or illicit substances.  Clinician inquired about Travis [name]'s emotional ratings today, as well as any significant changes in thoughts, feelings or behavior since last check-in.  Travis [name] reported scores of 4/10 for depression, 2/10 for anxiety, and 0/10 for anger/irritability, but denied experiencing any recent panic attacks.  Travis [name] reported that last week was difficult, as his dog passed away unexpectedly on 12-29-22.  He stated "Its been tough.  I've been trying to work through that".  Clinician expressed sympathy for Travis [name]'s loss, and covered a handout with him of the stages of grief, as he reported that he has not experienced the loss of someone/something close to him in over a decade.  This handout detailed the various stages he could experience as he grieves the loss of this pet (I.e. denial, bargaining, anger, depression, and acceptance), as well as helpful strategies for coping with the loss in healthy ways  as he moves through the stages towards acceptance, including staying close to support system, taking time for self-care activities, considering ways to memorialize the pet, writing in journal more often as additional outlet for feelings, and considering engagement in grief/loss support groups if necessary.  Travis [name] was receptive to this material, and reported that he feels that at times he is still in denial because he expects the dog to show up to greet him when he returns home from work each day.  He reported that feelings of depression had been more significant over following days as well, but he has been coping by regularly talking with his supports, cleaning up the home with his sister to reduce amount of reminders of the dog around, using grounding skills to manage difficult feelings that could influence panic episodes, and including enjoyable activities in his schedule to look forward to, such as golfing with friends.  Interventions were effective, as evidenced by Travis [name] reporting that discussing this material gave him greater knowledge regarding the natural process of grieving and what to anticipate, in addition to feeling supported and having feelings validated during this difficult time.  He stated "This was helpful to process everything that happened over the last few days.  I feel like I can speak candidly with you and not be judged.  This always helps me think more logically so I can figure out what to do to keep moving even when I'm struggling".  Clinician will continue to monitor.        Plan: Meet again in 1 week.   Diagnosis: Panic Disorder.    [name], LCSW,  LCAS 03/07/2021

## 2021-03-13 ENCOUNTER — Ambulatory Visit (INDEPENDENT_AMBULATORY_CARE_PROVIDER_SITE_OTHER): Payer: 59 | Admitting: Licensed Clinical Social Worker

## 2021-03-13 ENCOUNTER — Other Ambulatory Visit: Payer: Self-pay

## 2021-03-13 DIAGNOSIS — F410 Panic disorder [episodic paroxysmal anxiety] without agoraphobia: Secondary | ICD-10-CM | POA: Diagnosis not present

## 2021-03-13 NOTE — Progress Notes (Signed)
Virtual Visit via Video Note   I connected with Travis [name] on 03/13/21 at 8:10am by video enabled telemedicine application and verified that I am speaking with the correct person using two identifiers.   I discussed the limitations, risks, security and privacy concerns of performing an evaluation and management service by video and the availability of in person appointments. I also discussed with the patient that there may be a patient responsible charge related to this service. The patient expressed understanding and agreed to proceed.   I discussed the assessment and treatment plan with the patient. The patient was provided an opportunity to ask questions and all were answered. The patient agreed with the plan and demonstrated an understanding of the instructions.   The patient was advised to call back or seek an in-person evaluation if the symptoms worsen or if the condition fails to improve as anticipated.   I provided 50 minutes of non-face-to-face time during this encounter.     [name], LCSW, LCAS __________________________ THERAPIST PROGRESS NOTE   Session Time: 8:10am - 9:00am    Location: Patient: Patient Home  Therapist: [facility]   Participation Level: Active   Behavioral Response: Alert, casually dressed, neatly groomed, anxious mood/affect   Type of Therapy: Individual Therapy    Treatment Goals addressed: Coping with anxiety and reducing panic attacks; Medication Compliance; Documentation of anxiety/panic episodes   Interventions: CBT, stages of grief, personal boundaries    Summary: Travis [name] is a 35 year old male mixed race male that presented today for therapy appointment with diagnosis of Panic Disorder.       Suicidal/Homicidal: None; without intent or plan.    Therapist Response:  Clinician met with Travis [name] for virtual therapy session and assessed for safety, sobriety and medication compliance.  Travis [name] presented for today's appointment 10  minutes later than planned, noting that he had called yesterday requesting a virtual visit, but clinician was not informed of this by staff. Travis [name] was alert, oriented x5, with no evidence or self-report of active SI/HI or A/V H.  Travis [name] reported that he continues taking medication responsibly and denied any abuse of alcohol or illicit substances.  Clinician inquired about Travis [name]'s current emotional ratings, as well as any significant changes in thoughts, feelings or behavior since previous check-in.  Travis [name] reported scores of 3/10 for depression, 4/10 for anxiety, and 0/10 for anger/irritability, but denied experiencing any recent panic attacks.  Travis [name] reported that although he hasn't had any panic attacks, he did experience an 'anxiety spike' one day over the weekend.  Clinician inquired about stressors/triggers that might have influenced this episode, as well as any coping skills he utilized to manage symptoms.  Travis [name] reported that he is still actively grieving his dog's passing, which has led to mixed feelings of depression and anxiety, so he coped by laying down, listening to his calming music playlist, and eventually talking to his partner for consolation.  Travis [name] reported that he was hesitant to open up to his partner at first, and this greatly fueled his anxiety, noting that he is still struggling to figure out where to draw the line in their communication and openness.  Clinician utilized a handout on personal boundaries with Daran today that featured various questions/talking points to guide productive discussion on the subject and increase insight into his personal boundary preferences.  This handout defined what healthy boundaries are (I.e. the rules/limits ones sets within their relationships), how personal values are reflected in them, how boundaries should  look in a healthy relationship, how current boundaries have been set with his key supports such as family members, comparing porous vs  rigid boundaries, and what challenges he is currently facing in establishing boundaries with his partner, as well as strategies for overcoming them together.  Intervention was effective, as evidenced by Travis [name] actively engaging in discussion on this subject and noting that although he has fluctuated between rigid and porous boundaries with several supports, he is actively seeking a healthy balance now to where he can say "No" to unreasonable demands without feeling guilty or abruptly cutting people off.  He reported that he would ask to speak with partner tonight about topic of shared values and boundaries to increase mutual understanding and support moving forward.  He stated "Talking about the anxiety episode also helped me realize that I'm not over my dog's death yet, and I need to take this day to day".  Clinician will continue to monitor.        Plan: Meet again in 1 week.   Diagnosis: Panic Disorder.    [name], LCSW, LCAS 03/13/2021

## 2021-03-14 ENCOUNTER — Other Ambulatory Visit (INDEPENDENT_AMBULATORY_CARE_PROVIDER_SITE_OTHER): Payer: Self-pay | Admitting: Family Medicine

## 2021-03-14 DIAGNOSIS — E559 Vitamin D deficiency, unspecified: Secondary | ICD-10-CM

## 2021-03-15 NOTE — Telephone Encounter (Signed)
Last seen by DR Jearld Shines

## 2021-03-16 ENCOUNTER — Encounter: Payer: Self-pay | Admitting: Emergency Medicine

## 2021-03-16 ENCOUNTER — Telehealth: Payer: 59 | Admitting: Physician Assistant

## 2021-03-16 ENCOUNTER — Ambulatory Visit
Admission: EM | Admit: 2021-03-16 | Discharge: 2021-03-16 | Disposition: A | Discharge status: Home / Self Care | Payer: 59 | Attending: Emergency Medicine | Admitting: Emergency Medicine

## 2021-03-16 ENCOUNTER — Other Ambulatory Visit: Payer: Self-pay

## 2021-03-16 DIAGNOSIS — J029 Acute pharyngitis, unspecified: Secondary | ICD-10-CM | POA: Insufficient documentation

## 2021-03-16 LAB — POCT RAPID STREP A (OFFICE): Rapid Strep A Screen: NEGATIVE

## 2021-03-16 NOTE — Discharge Instructions (Addendum)
Strep test negative, COVID test pending, oral swab pending to screen for STDs Tylenol and ibuprofen for pain Continue daily Zyrtec, may add an over-the-counter Mucinex, Sudafed to further help with any congestion/drainage contributing to sore throat Rest and fluids Follow-up if not improving or worsening

## 2021-03-16 NOTE — ED Provider Notes (Signed)
[name]    CSN: [id number] Arrival date & time: 03/16/21  2979      History   Chief Complaint Chief Complaint  Patient presents with   Sore Throat    HPI Travis [name] is a 35 y.o. male history of asthma, hypertension, presenting today for evaluation of sore throat.  Reports sore throat over the past 3 days.  Reports very minimal associated cough and reports continued congestion attributed to allergies.  Takes daily Zyrtec.  Denies any fevers chills or body aches.  Does report COVID exposure this past weekend.  At home test negative.  Does report recent partner and expresses possible concern of throat being related to STDs.  HPI  Past Medical History:  Diagnosis Date   ADD (attention deficit disorder)    Asthma    childhood   Asthma    Chest pain at rest 05/17/2020   Class 3 severe obesity with serious comorbidity and body mass index (BMI) of 45.0 to 49.9 in adult [facility]) 07/28/2019   Colon polyps    Essential hypertension 03/20/2020   Hip pain    HTN (hypertension)    Insulin resistance 05/25/2020   Intermittent palpitations 05/17/2020   Joint pain    Left hip pain 07/28/2019   Morbid obesity ([place]) 05/17/2020   Near syncope 05/17/2020   Other insomnia 04/03/2020   Overweight    Restless legs syndrome (RLS) 05/17/2020   Snoring 05/17/2020   Vitamin D deficiency 03/20/2020    Patient Active Problem List   Diagnosis Date Noted   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   Overweight    Joint pain    HTN (hypertension)    Hip pain    Colon polyps    Asthma    ADD (attention deficit disorder)    Insulin resistance 05/25/2020   Snoring 05/17/2020   Morbid obesity ([place]) 05/17/2020   Restless legs syndrome (RLS) 05/17/2020   Chest pain at rest 05/17/2020   Intermittent palpitations 05/17/2020   Near syncope 05/17/2020   Other insomnia 04/03/2020   Vitamin D deficiency 03/20/2020   Essential hypertension 03/20/2020   Class 3 severe obesity with  serious comorbidity and body mass index (BMI) of 45.0 to 49.9 in adult [facility]) 07/28/2019   Left hip pain 07/28/2019    Past Surgical History:  Procedure Laterality Date   arm Right    TONSILLECTOMY     WISDOM TOOTH EXTRACTION         Home Medications    Prior to Admission medications   Medication Sig Start Date End Date Taking? Authorizing Provider  diphenhydrAMINE (BENADRYL) 50 MG capsule Take 50 mg by mouth daily.   Yes [provider]  venlafaxine XR (EFFEXOR-XR) 150 MG 24 hr capsule Take 150 mg by mouth daily. 08/08/20  Yes [provider]  clonazePAM (KLONOPIN) 0.5 MG tablet Take 1 tablet (0.5 mg total) by mouth 2 (two) times daily as needed for anxiety. 07/13/20   Laqueta Linden, MD  DOXEPIN HCL PO Take 50 mg by mouth.    [provider]  sildenafil (VIAGRA) 100 MG tablet Take 0.5-1 tablets (50-100 mg total) by mouth daily as needed for erectile dysfunction. 09/07/20   Shelda Pal, DO  traZODone (DESYREL) 50 MG tablet Take 50 mg by mouth. 1-2 TABLETS AT BEDTIME    [provider]  venlafaxine XR (EFFEXOR-XR) 75 MG 24 hr capsule Take 1 capsule by mouth daily.  07/24/20   [provider]  Vitamin D,  Ergocalciferol, (DRISDOL) 1.25 MG (50000 UNIT) CAPS capsule Take 1 capsule (50,000 Units total) by mouth every 7 (seven) days. 12/07/20   Laqueta Linden, MD    Family History Family History  Problem Relation Age of Onset   Obesity Mother    Mental illness Mother    Depression Mother    Bipolar disorder Mother    Sleep apnea Mother    Alcoholism Mother    Eating disorder Mother    Irritable bowel syndrome Sister    Hearing loss Maternal Grandmother     Social History Social History   Tobacco Use   Smoking status: Never   Smokeless tobacco: Never  Substance Use Topics   Alcohol use: Yes    Comment: on weekends   Drug use: No     Allergies   Patient has no known allergies.   Review of Systems Review  of Systems  Constitutional:  Negative for activity change, appetite change, chills, fatigue and fever.  HENT:  Positive for congestion and sore throat. Negative for ear pain, rhinorrhea, sinus pressure and trouble swallowing.   Eyes:  Negative for discharge and redness.  Respiratory:  Positive for cough. Negative for chest tightness and shortness of breath.   Cardiovascular:  Negative for chest pain.  Gastrointestinal:  Negative for abdominal pain, diarrhea, nausea and vomiting.  Musculoskeletal:  Negative for myalgias.  Skin:  Negative for rash.  Neurological:  Negative for dizziness, light-headedness and headaches.    Physical Exam Triage Vital Signs ED Triage Vitals  Enc Vitals Group     BP      Pulse      Resp      Temp      Temp src      SpO2      Weight      Height      Head Circumference      Peak Flow      Pain Score      Pain Loc      Pain Edu?      Excl. in [place]?    No data found.  Updated Vital Signs BP (!) 143/88 (BP Location: Right Arm)   Pulse 80   Temp 98.1 F (36.7 C) (Oral)   Resp 16   SpO2 97%   Visual Acuity Right Eye Distance:   Left Eye Distance:   Bilateral Distance:    Right Eye Near:   Left Eye Near:    Bilateral Near:     Physical Exam Vitals and nursing note reviewed.  Constitutional:      Appearance: He is well-developed.     Comments: No acute distress  HENT:     Head: Normocephalic and atraumatic.     Ears:     Comments: Bilateral ears without tenderness to palpation of external auricle, tragus and mastoid, EAC's without erythema or swelling, TM's with good bony landmarks and cone of light. Non erythematous.      Nose: Nose normal.     Mouth/Throat:     Comments: Oral mucosa pink and moist, no tonsillar enlargement or exudate. Posterior pharynx patent and nonerythematous, no uvula deviation or swelling. Normal phonation.  Eyes:     Conjunctiva/sclera: Conjunctivae normal.  Cardiovascular:     Rate and Rhythm: Normal rate  and regular rhythm.  Pulmonary:     Effort: Pulmonary effort is normal. No respiratory distress.     Comments: Breathing comfortably at rest, CTABL, no wheezing, rales or other adventitious sounds auscultated  Abdominal:     General: There is no distension.  Musculoskeletal:        General: Normal range of motion.     Cervical back: Neck supple.  Skin:    General: Skin is warm and dry.  Neurological:     Mental Status: He is alert and oriented to person, place, and time.     UC Treatments / Results  Labs (all labs ordered are listed, but only abnormal results are displayed) Labs Reviewed  CULTURE, GROUP A STREP ([place])  NOVEL CORONAVIRUS, NAA  POCT RAPID STREP A (OFFICE)  CYTOLOGY, (ORAL, ANAL, URETHRAL) ANCILLARY ONLY    EKG   Radiology No results found.  Procedures Procedures (including critical care time)  Medications Ordered in UC Medications - No data to display  Initial Impression / Assessment and Plan / UC Course  I have reviewed the triage vital signs and the nursing notes.  Pertinent labs & imaging results that were available during my care of the patient were reviewed by me and considered in my medical decision making (see chart for details).     Sore throat-suspect likely viral etiology, strep negative, exam reassuring, COVID test pending given recent exposure, recommending symptomatic and supportive care, screening for oral STDs.  Discussed strict return precautions. Patient verbalized understanding and is agreeable with plan.  Final Clinical Impressions(s) / UC Diagnoses   Final diagnoses:  Sore throat     Discharge Instructions      Strep test negative, COVID test pending, oral swab pending to screen for STDs Tylenol and ibuprofen for pain Continue daily Zyrtec, may add an over-the-counter Mucinex, Sudafed to further help with any congestion/drainage contributing to sore throat Rest and fluids Follow-up if not improving or  worsening     ED Prescriptions   None    PDMP not reviewed this encounter.   Janith Lima, [place] 03/16/21 [phone]

## 2021-03-16 NOTE — ED Triage Notes (Addendum)
Patient c/o sore throat x 2 days.   Patient denies fever at home.   Patient endorses intermittent pain when swallowing.   Patient endorses nonproductive cough at times.   Patient reports taking 3 at home COVID-19 test w/ negative results per patient statement.   Patient endorses taking Advil PM at home w/ some relief of symptoms.

## 2021-03-16 NOTE — Progress Notes (Signed)
Patient in UC already

## 2021-03-17 LAB — NOVEL CORONAVIRUS, NAA: SARS-CoV-2, NAA: NOT DETECTED

## 2021-03-17 LAB — SARS-COV-2, NAA 2 DAY TAT

## 2021-03-18 LAB — CYTOLOGY, (ORAL, ANAL, URETHRAL) ANCILLARY ONLY
Chlamydia: NEGATIVE
Comment: NEGATIVE
Comment: NEGATIVE
Comment: NORMAL
Neisseria Gonorrhea: NEGATIVE
Trichomonas: NEGATIVE

## 2021-03-19 LAB — CULTURE, GROUP A STREP (THRC)

## 2021-03-25 ENCOUNTER — Telehealth: Payer: 59

## 2021-03-25 ENCOUNTER — Telehealth: Payer: 59 | Admitting: Family

## 2021-03-25 DIAGNOSIS — M25571 Pain in right ankle and joints of right foot: Secondary | ICD-10-CM | POA: Diagnosis not present

## 2021-03-25 MED ORDER — PREDNISONE 10 MG (21) PO TBPK
ORAL_TABLET | ORAL | 0 refills | Status: DC
Start: 2021-03-25 — End: 2021-08-06

## 2021-03-25 MED ORDER — DICLOFENAC SODIUM 75 MG PO TBEC: 75.0000 mg | DELAYED_RELEASE_TABLET | Freq: Two times a day (BID) | ORAL | 0 refills | Status: DC

## 2021-03-25 NOTE — Progress Notes (Signed)
Virtual Visit Consent   Travis [name], you are scheduled for a virtual visit with a [facility] provider today.     Just as with appointments in the office, your consent must be obtained to participate.  Your consent will be active for this visit and any virtual visit you may have with one of our providers in the next 365 days.     If you have a MyChart account, a copy of this consent can be sent to you electronically.  All virtual visits are billed to your insurance company just like a traditional visit in the office.    As this is a virtual visit, video technology does not allow for your provider to perform a traditional examination.  This may limit your provider's ability to fully assess your condition.  If your provider identifies any concerns that need to be evaluated in person or the need to arrange testing (such as labs, EKG, etc.), we will make arrangements to do so.     Although advances in technology are sophisticated, we cannot ensure that it will always work on either your end or our end.  If the connection with a video visit is poor, the visit may have to be switched to a telephone visit.  With either a video or telephone visit, we are not always able to ensure that we have a secure connection.     I need to obtain your verbal consent now.   Are you willing to proceed with your visit today?    [name] has provided verbal consent on 03/25/2021 for a virtual visit (video or telephone).   Travis Dun, FNP   Date: 03/25/2021 7:10 PM   Virtual Visit via Video Note   IEvelina [name], connected [email or web address]@ ([id number], May 29, 1986) on 03/25/21 at  7:15 PM EDT by a video-enabled telemedicine application and verified that I am speaking with the correct person using two identifiers.  Location: Patient: Virtual Visit Location Patient: Home Provider: Virtual Visit Location Provider: Home   I discussed the limitations of evaluation and management by telemedicine and the  availability of in person appointments. The patient expressed understanding and agreed to proceed.    History of Present Illness: Travis [name] is a 35 y.o. who identifies as a male who was assigned male at birth, and is being seen today for ankle pain .  HPI: Ankle Pain  The incident occurred 12 to 24 hours ago. The injury mechanism was a twisting injury. The pain is present in the right ankle. The quality of the pain is described as aching. The pain is at a severity of 7/10. The pain is moderate. The pain has been Intermittent since onset. Pertinent negatives include no numbness or tingling. He reports no foreign bodies present. The symptoms are aggravated by movement and weight bearing. He has tried acetaminophen, heat, ice and non-weight bearing for the symptoms. The treatment provided mild relief.   Problems:  Patient Active Problem List   Diagnosis Date Noted   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   Overweight    Joint pain    HTN (hypertension)    Hip pain    Colon polyps    Asthma    ADD (attention deficit disorder)    Insulin resistance 05/25/2020   Snoring 05/17/2020   Morbid obesity ([place]) 05/17/2020   Restless legs syndrome (RLS) 05/17/2020   Chest pain at rest 05/17/2020   Intermittent palpitations 05/17/2020   Near syncope 05/17/2020   Other insomnia 04/03/2020  Vitamin D deficiency 03/20/2020   Essential hypertension 03/20/2020   Class 3 severe obesity with serious comorbidity and body mass index (BMI) of 45.0 to 49.9 in adult [facility]) 07/28/2019   Left hip pain 07/28/2019    Allergies: No Known Allergies Medications:  Current Outpatient Medications:    diclofenac (VOLTAREN) 75 MG EC tablet, Take 1 tablet (75 mg total) by mouth 2 (two) times daily., Disp: 30 tablet, Rfl: 0   predniSONE (STERAPRED UNI-PAK 21 TAB) 10 MG (21) TBPK tablet, Use as directed, Disp: 21 tablet, Rfl: 0   clonazePAM (KLONOPIN) 0.5 MG tablet, Take 1 tablet (0.5 mg total) by mouth 2 (two)  times daily as needed for anxiety., Disp: 20 tablet, Rfl: 0   diphenhydrAMINE (BENADRYL) 50 MG capsule, Take 50 mg by mouth daily., Disp: , Rfl:    DOXEPIN HCL PO, Take 50 mg by mouth., Disp: , Rfl:    sildenafil (VIAGRA) 100 MG tablet, Take 0.5-1 tablets (50-100 mg total) by mouth daily as needed for erectile dysfunction., Disp: 30 tablet, Rfl: 2   traZODone (DESYREL) 50 MG tablet, Take 50 mg by mouth. 1-2 TABLETS AT BEDTIME, Disp: , Rfl:    venlafaxine XR (EFFEXOR-XR) 150 MG 24 hr capsule, Take 150 mg by mouth daily., Disp: , Rfl:    venlafaxine XR (EFFEXOR-XR) 75 MG 24 hr capsule, Take 1 capsule by mouth daily. , Disp: , Rfl:    Vitamin D, Ergocalciferol, (DRISDOL) 1.25 MG (50000 UNIT) CAPS capsule, Take 1 capsule (50,000 Units total) by mouth every 7 (seven) days., Disp: 12 capsule, Rfl: 0  Observations/Objective: Patient is well-developed, well-nourished in no acute distress.  Resting comfortably  at home.  Head is normocephalic, atraumatic.  No labored breathing. Speech is clear and coherent with logical content.  Patient is alert and oriented at baseline.    Assessment and Plan: 1. Acute right ankle pain - diclofenac (VOLTAREN) 75 MG EC tablet; Take 1 tablet (75 mg total) by mouth 2 (two) times daily.  Dispense: 30 tablet; Refill: 0 - predniSONE (STERAPRED UNI-PAK 21 TAB) 10 MG (21) TBPK tablet; Use as directed  Dispense: 21 tablet; Refill: 0 Rest Ice ROM exercises encouraged   Follow Up Instructions: I discussed the assessment and treatment plan with the patient. The patient was provided an opportunity to ask questions and all were answered. The patient agreed with the plan and demonstrated an understanding of the instructions.  A copy of instructions were sent to the patient via MyChart.  The patient was advised to call back or seek an in-person evaluation if the symptoms worsen or if the condition fails to improve as anticipated.  Time:  I spent 6 minutes with the patient  via telehealth technology discussing the above problems/concerns.    Travis Dun, FNP

## 2021-03-27 ENCOUNTER — Ambulatory Visit (INDEPENDENT_AMBULATORY_CARE_PROVIDER_SITE_OTHER): Payer: 59 | Admitting: Licensed Clinical Social Worker

## 2021-03-27 ENCOUNTER — Other Ambulatory Visit: Payer: Self-pay

## 2021-03-27 DIAGNOSIS — F410 Panic disorder [episodic paroxysmal anxiety] without agoraphobia: Secondary | ICD-10-CM | POA: Diagnosis not present

## 2021-03-27 NOTE — Progress Notes (Signed)
THERAPIST PROGRESS NOTE   Session Time: 4:00pm - 4:50pm    Location: Patient: [facility] [place] [facility] Therapist: [facility] [place] [facility]   Participation Level: Active   Behavioral Response: Alert, casually dressed, neatly groomed, irritable mood/affect   Type of Therapy: Individual Therapy   Treatment Goals addressed: Coping with anxiety and reducing panic attacks; Medication Compliance;   Interventions: CBT, pain assessment, mindfulness meditation for pain relief   Summary: Travis [name] is a 35 year old male mixed race male that presented today for therapy appointment with diagnosis of Panic Disorder.       Suicidal/Homicidal: None; without intent or plan.    Therapist Response:  Clinician met with Travis [name] for in person therapy appointment and assessed for safety, sobriety and medication compliance.  Travis [name] presented for today's session on time and was alert, oriented x5, with no evidence or self-report of active SI/HI or A/V H.  Travis [name] reported ongoing compliance with medication and denied any abuse of alcohol or illicit substances.  Clinician inquired about Travis [name]'s emotional ratings today, as well as any significant changes in thoughts, feelings or behavior since last check-in.  Travis [name] reported scores of 0/10 for depression, 2/10 for anxiety, 2/10 for irritability, 4/10 for pain, but denied experiencing any recent panic attacks.  Travis [name] was observed with an air cast on his right leg and experiencing difficulty walking.  Travis [name] reported that he believes he sprained it over the weekend when he jumped down 4 feet from a ledge, but outreached the ER for guidance after this event and was recommended to stay off it.  Clinician reminded [name] of availability of virtual appointments so that he doesn't have to strain himself coming to sessions, and completed a pain assessment with Travis [name], recommended that he follow physician recommendations and consider scheduling an appointment with his PCP if condition  doesn't improve within next days.  Travis [name] was agreeable to this.  Clinician offered to teach Travis [name] a mindfulness meditation exercise today focused on pain relief, and he was agreeable to this.  Clinician guided Travis [name] through process of getting comfortable, achieving relaxing breathing rhythm, and then practicing combination activity involving comprehensive body scan, and reciting pain relief affirmations. Intervention was effective, as evidenced by Travis [name] successfully engaging in this activity and reporting that his pain level reduced to 3/10 in severity, and he felt more relaxed and less irritable afterward.  He stated  "It took awhile to quiet my mind, but I realized I could transform the pain and control it better.  I need to do this more often".  Travis [name] reported that he would plan to add this to self-care routine, as he has been trying to practice meditation and grounding techniques more regularly.  Clinician will continue to monitor.        Plan: Meet again in 1 week.   Diagnosis: Panic Disorder.   [name], [place], LCAS 03/27/2021

## 2021-04-03 ENCOUNTER — Other Ambulatory Visit: Payer: Self-pay

## 2021-04-03 ENCOUNTER — Ambulatory Visit (INDEPENDENT_AMBULATORY_CARE_PROVIDER_SITE_OTHER): Payer: 59 | Admitting: Licensed Clinical Social Worker

## 2021-04-03 DIAGNOSIS — F410 Panic disorder [episodic paroxysmal anxiety] without agoraphobia: Secondary | ICD-10-CM

## 2021-04-03 NOTE — Progress Notes (Signed)
THERAPIST PROGRESS NOTE   Session Time: 8:00am - 9:00AM     Location: Patient: [facility] [place] [facility] Therapist: [facility] [place] [facility]   Participation Level: Active   Behavioral Response: Alert, casually dressed, neatly groomed, anxious mood/affect   Type of Therapy: Individual Therapy   Treatment Goals addressed: Coping with anxiety and reducing panic attacks; Medication Compliance   Interventions: CBT, communication skills     Summary: Delford Wingert is a 35 year old male mixed race male that presented today for therapy appointment with diagnosis of Panic Disorder.       Suicidal/Homicidal: None; without intent or plan.    Therapist Response:  Clinician met with [name] for in person therapy session and assessed for safety, sobriety and medication compliance.  Jove presented for today's appointment on time and was alert, oriented x5, with no evidence or self-report of active SI/HI or A/V H.  Joevon reported that he continues taking medication responsibly and denied any abuse of alcohol or illicit substances.  Clinician inquired about Konnor's current emotional ratings, as well as any significant changes in thoughts, feelings or behavior since previous check-in.  Granite reported scores of 0/10 for depression, 1/10 for anxiety, 0/10 for anger/irritability, 2/10 for pain, but denied experiencing any recent panic attacks.  Fawaz was observed without air case on his leg today, and reported that his sprained ankle is healing well and he is trying to rest often so it will continue to heal.  He reported that his primary stressor at this time is processing recent issues within his relationship that have arisen, noting that this has made him consider taking a break from her since he does not understand why her behavior has changed so drastically.  Clinician reviewed material with [name] today on communication skills which could be utilized to increase understanding and support within the relationship.   Clinician presented a handout on 'soft startups' which offered suggestions on how Darcy could address a problem assertively with his partner, including tips such as choosing an appropriate time/setting, being mindful of maintaining gentle tone, volume and language, while avoiding triggering nonverbals such as rolling eyes, as well as utilizing "I" statements to express feelings, focusing on one problem at a time, and being respectful.   Intervention was effective, as evidenced by [name] actively engaging in discussion on this topic, taking notes on his phone, and practicing "I" statements he could utilize later when addressing these issues with partner.  He reported that he would consider writing out responses first on paper to properly organize his thoughts, and then request to speak with her later this evening in person since texting or phone calls might not be as effective.  He stated "Maybe I haven't made the most comfortable space for her to share her feelings.  I don't think she has realized how much this has affected me either, though".  Clinician will continue to monitor.     Plan: Meet again in 1 week.   Diagnosis: Panic Disorder.   [name], [place], LCAS 04/03/2021

## 2021-04-11 ENCOUNTER — Other Ambulatory Visit: Payer: Self-pay

## 2021-04-11 ENCOUNTER — Ambulatory Visit (INDEPENDENT_AMBULATORY_CARE_PROVIDER_SITE_OTHER): Payer: 59 | Admitting: Licensed Clinical Social Worker

## 2021-04-11 ENCOUNTER — Ambulatory Visit (HOSPITAL_COMMUNITY): Payer: 59 | Admitting: Licensed Clinical Social Worker

## 2021-04-11 DIAGNOSIS — F410 Panic disorder [episodic paroxysmal anxiety] without agoraphobia: Secondary | ICD-10-CM

## 2021-04-11 NOTE — Progress Notes (Signed)
Virtual Visit via Video Note   I connected with Travis [name] on 04/11/21 at 10:00am by video enabled telemedicine application and verified that I am speaking with the correct person using two identifiers.   I discussed the limitations, risks, security and privacy concerns of performing an evaluation and management service by video and the availability of in person appointments. I also discussed with the patient that there may be a patient responsible charge related to this service. The patient expressed understanding and agreed to proceed.   I discussed the assessment and treatment plan with the patient. The patient was provided an opportunity to ask questions and all were answered. The patient agreed with the plan and demonstrated an understanding of the instructions.   The patient was advised to call back or seek an in-person evaluation if the symptoms worsen or if the condition fails to improve as anticipated.   I provided 1 hour of non-face-to-face time during this encounter.     Travis Flood, LCSW, LCAS ______________________________ THERAPIST PROGRESS NOTE   Session Time: 10:00am - 11:00am       Location: Patient: Patient home  Therapist: [facility] [place] [facility]   Participation Level: Active   Behavioral Response: Alert, casually dressed, neatly groomed, euthymic mood/affect   Type of Therapy: Individual Therapy   Treatment Goals addressed: Coping with anxiety and managing panic attacks; Medication Compliance; Psychiatry follow up    Interventions: CBT, ACT skills    Summary: Travis [name] is a 35 year old male mixed race male that presented today for therapy appointment with diagnosis of Panic Disorder.       Suicidal/Homicidal: None; without intent or plan.    Therapist Response:  Clinician met with Travis [name] for virtual therapy appointment and assessed for safety, sobriety and medication compliance.  Travis [name] presented for today's session on time and was alert, oriented x5, with no  evidence or self-report of active SI/HI or A/V H.  [name] reported ongoing compliance with medication and denied any abuse of alcohol or illicit substances.  Clinician inquired about Travis [name]'s emotional ratings today, as well as any significant changes in thoughts, feelings or behavior since last check-in.  Travis [name] reported scores of 0/10 for depression, 1/10 for anxiety, 0/10 for anger/irritability, and 2/10 for pain related to his recent ankle sprain, but denied experiencing any recent panic attacks.  Travis [name] reported that recent successes included writing a letter to his partner after last session and sharing this with her that evening, which led to a productive 3 hour conversation and strengthened understanding and support within the relationship.  He [place] and he was able to get in time for self-care.  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  He reported that he wished to continue practice of coping skills via therapy in order to help prepare for this upcoming transition.  Clinician offered to teach Travis [name] an ACT relaxation technique today to aid in managing difficult thoughts, feelings, urges, and sensations, which he was agreeable to.  Clinician guided Travis [name] through process of getting comfortable, achieving relaxing breathing rhythm, and then maintaining this throughout activity.  Clinician invited Travis [name] to imagine a gently flowing stream in his mind with leaves floating upon it, and when any thoughts, feelings, urges, or sensations arose, good or bad, he would  visualize placing them on these passing leaves over course of 10 minutes practice.  Intervention was effective, as evidenced by Travis [name] participating in activity successfully and reporting that he felt more  relaxed in mind and body afterward, stating "I think it's a good tactic.  I realized that I had a lot of tension in my shoulders at first, but that went away and I was able to keep placing thoughts on leaves".  He reported that he would begin doing this at bedtime to form a routine.  Clinician will continue to monitor.      Plan: Meet again in 1 week.   Diagnosis: Panic Disorder.   Travis [name], Travis [name], LCAS 04/11/2021

## 2021-04-16 ENCOUNTER — Other Ambulatory Visit (HOSPITAL_COMMUNITY): Payer: Self-pay | Admitting: Sports Medicine

## 2021-04-16 ENCOUNTER — Other Ambulatory Visit: Payer: Self-pay

## 2021-04-16 ENCOUNTER — Ambulatory Visit (HOSPITAL_COMMUNITY)
Admission: RE | Admit: 2021-04-16 | Discharge: 2021-04-16 | Disposition: A | Discharge status: Home / Self Care | Payer: 59 | Source: Ambulatory Visit | Attending: Cardiology | Admitting: Cardiology

## 2021-04-16 DIAGNOSIS — M79604 Pain in right leg: Secondary | ICD-10-CM | POA: Diagnosis present

## 2021-04-16 DIAGNOSIS — M7989 Other specified soft tissue disorders: Secondary | ICD-10-CM | POA: Insufficient documentation

## 2021-04-17 ENCOUNTER — Ambulatory Visit (INDEPENDENT_AMBULATORY_CARE_PROVIDER_SITE_OTHER): Payer: 59 | Admitting: Licensed Clinical Social Worker

## 2021-04-17 DIAGNOSIS — F410 Panic disorder [episodic paroxysmal anxiety] without agoraphobia: Secondary | ICD-10-CM

## 2021-04-17 NOTE — Progress Notes (Signed)
THERAPIST PROGRESS NOTE   Session Time: 8:00am - 8:45am    Location: Patient: [facility] [place] [facility] Therapist: [facility] [place] [facility]   Participation Level: Active   Behavioral Response: Alert, casually dressed, neatly groomed, euthymic mood/affect    Type of Therapy: Individual Therapy   Treatment Goals addressed: Coping with anxiety and managing panic attacks; Medication Compliance; Exercise routine; Setting aside time for self-care    Interventions: CBT: self-care assessment      Summary: Travis [name] is a 35 year old male mixed race male that presented today for therapy appointment with diagnosis of Panic Disorder.       Suicidal/Homicidal: None; without intent or plan.    Therapist Response:  Clinician met with Travis [name] for in-person therapy session and assessed for safety, sobriety and medication compliance.  Travis [name] presented for today's appointment on time and was alert, oriented x5, with no evidence or self-report of active SI/HI or A/V H.  Travis [name] reported that he continues taking medication responsibly and denied any abuse of alcohol or illicit substances.  Clinician inquired about Travis [name]'s current emotional ratings, as well as any significant changes in thoughts, feelings or behavior since previous check-in.  Travis [name] reported scores of 0/10 for depression, 1/10 for anxiety, 0/10 for anger/irritability, and 2/10 for pain in his ankle.  He reported that he thought his ankle had been getting worse, so he visited his orthopedic office yesterday and will be starting physical therapy on Friday.  He denied experiencing any panic attacks.  Travis [name] reported that things are going well with work.  He reported that his relationship is also positive with no issues.  Travis [name] reported that overall self-care is improving, although there are still some gaps due to his busy schedule.  Clinician discussed topic of self-care today with Travis [name] and explained how this can be defined as the things one does to maintain  good health and improve well-being.  Clinician provided Travis [name] with a self-care assessment form to complete which featured various sub-categories of self-care, including physical, psychological/emotional, social, spiritual, and professional.  Travis [name] was asked to rank his engagement in the activities listed for each dimension on a scale of 1-3, with 1 indicating 'Poor', 2 indicating 'Ok', and 3 indicating 'Well'.  Clinician invited Travis [name] to share results of this assessment, and inquired about which areas of self-care he is doing well in, as well as areas that require attention, and how he plans to begin addressing this during treatment.  Intervention was effective, as evidenced by Travis [name] participating in assessment, completing first 2 sections, and noting several areas that he would benefit from working on in upcoming week, including exercising x3 per week, following a healthier diet by meal prepping, following a more consistent sleep routine, participating in fun activities, learning new things such as flying a plane, and take more day trips or vacations as a break from busy work schedule.  Travis [name] stated "This was helpful for identifying places for me to work on, but also things I'm doing well.  I guess I didn't think of some of these things as self-care".  Clinician will continue to monitor.      Plan: Meet again in 1 week.   Diagnosis: Panic Disorder.   [name], [place], LCAS 04/17/2021

## 2021-04-24 ENCOUNTER — Ambulatory Visit (INDEPENDENT_AMBULATORY_CARE_PROVIDER_SITE_OTHER): Payer: 59 | Admitting: Licensed Clinical Social Worker

## 2021-04-24 ENCOUNTER — Other Ambulatory Visit: Payer: Self-pay

## 2021-04-24 DIAGNOSIS — F410 Panic disorder [episodic paroxysmal anxiety] without agoraphobia: Secondary | ICD-10-CM

## 2021-04-24 NOTE — Progress Notes (Signed)
THERAPIST PROGRESS NOTE   Session Time: 8:05am - 9:00am    Location: Patient: [facility] [place] [facility] Therapist: [facility] [place] [facility]   Participation Level: Active   Behavioral Response: Alert, casually dressed, neatly groomed, euthymic mood/affect    Type of Therapy: Individual Therapy   Treatment Goals addressed: Coping with anxiety and managing panic attacks; Medication Compliance; Setting aside time for self-care   Interventions: CBT: self-care assessment    Summary: Travis [name] is a 35 year old male mixed race male that presented today for therapy appointment with diagnosis of Panic Disorder.       Suicidal/Homicidal: None; without intent or plan.    Therapist Response:  Clinician met with Travis [name] for in-person therapy appointment and assessed for safety, sobriety and medication compliance.  Travis [name] presented for today's session 5 minutes late due to traffic, but was alert, oriented x5, with no evidence or self-report of active SI/HI or A/V H.  Travis [name] reported ongoing compliance with medication and denied any abuse of alcohol or illicit substances.  Clinician inquired about Travis [name]'s emotional ratings today, as well as any significant changes in thoughts, feelings or behavior since last check-in.  Travis [name] reported scores of 0/10 for depression, 2/10 for anxiety, 0/10 for anger/irritability, and 3/10 for pain in his ankle.  Travis [name] reported that he will begin physical therapy for his ankle tomorrow.  He denied any panic attacks since last session.  Travis [name] reported that he had a good birthday, and travelled to [place] with friends to celebrate.  Travis [name] reported that he also went out to dinner with his girlfriend for his birthday.  He reported that he has been doing well with self-care, including meal prep, reaching out to old friends, and 'extending an olive branch to someone that was griping about things'.  Travis [name] reported that he wished to finish previous assessment on his self-care.  Clinician  provided Travis [name] with another copy of the self-care assessment form so that he could complete the final 2 sections (i.e. spiritual and professional).  Travis [name] was asked to rank his engagement in the activities listed for each dimension on a scale of 1-3, with 1 indicating 'Poor', 2 indicating 'Ok', and 3 indicating 'Well'.  Clinician invited Travis [name] to share final results of this assessment, and inquired about which areas of these categories he is doing well in, as well as areas that require attention, and how he plans to begin addressing this during treatment.  Intervention was effective, as evidenced by Travis [name] participating in self-care assessment, and reporting that there are several areas he would like to improve upon, including spending more time in nature, meditating, praying, appreciating art, and finding things that give life meaning in regard to spiritual category, as well as improving balance in personal and professional life, learning new things, and building professional relationships for the professional category. Travis [name] reported that it was helpful to complete this activity, and stated "I want to keep checking out opportunities for self-improvement because there are a lot of these activities that I need to focus on a little more".  Clinician will continue to monitor.     Plan: Meet again in 1 week.   Diagnosis: Panic Disorder.   [name], LCSW, LCAS 04/24/2021

## 2021-05-01 ENCOUNTER — Ambulatory Visit (INDEPENDENT_AMBULATORY_CARE_PROVIDER_SITE_OTHER): Payer: 59 | Admitting: Licensed Clinical Social Worker

## 2021-05-01 ENCOUNTER — Other Ambulatory Visit: Payer: Self-pay

## 2021-05-01 DIAGNOSIS — F410 Panic disorder [episodic paroxysmal anxiety] without agoraphobia: Secondary | ICD-10-CM | POA: Diagnosis not present

## 2021-05-01 NOTE — Progress Notes (Signed)
THERAPIST PROGRESS NOTE   Session Time: 8:00am - 9:00am    Location: Patient: [facility] [place] [facility] Therapist: [facility] [place] [facility]   Participation Level: Active   Behavioral Response: Alert, casually dressed, neatly groomed, euthymic mood/affect    Type of Therapy: Individual Therapy   Treatment Goals addressed: Coping with anxiety and managing panic attacks; Medication Compliance; Setting aside time for self-care   Interventions: CBT, peaceful place guided meditation      Summary: Travis [name] is a 35 year old male mixed race male that presented today for therapy appointment with diagnosis of Panic Disorder.       Suicidal/Homicidal: None; without intent or plan.    Therapist Response:  Clinician met with Travis [name] for in-person therapy session and assessed for safety, sobriety and medication compliance.  Travis [name] presented for today's appointment on time and was alert, oriented x5, with no evidence or self-report of active SI/HI or A/V H.  Travis [name] reported that he continues taking medication as prescribed and denied any abuse of alcohol or illicit substances.  Clinician inquired about Travis [name]'s current emotional ratings, as well as any significant changes in thoughts, feelings or behavior since previous check-in.  Travis [name] reported scores of 0/10 for depression, 0/10 for anxiety, 0/10 for anger/irritability, and 3/10 for pain in his ankle.  Travis [name] denied any panic attacks.  Travis [name] reported that he has been pleased with progress over recent weeks, and found that the recent self-care assessment activity was helpful for highlighting positive changes he could make.  Clinician praised Travis [name] for recent progress, and inquired about whether frequency of therapy sessions should be reduced as a result of increased stability.  Travis [name] was agreeable to this, and reported that he would like to meet biweekly for now.  He reported that he would like to continue working on coping skills today in order to help with this  transition.  Clinician invited Travis [name] to participate in peaceful place guided imagery activity today as a form of self-care.  Clinician explained how this is a powerful visualization tool which can aid in reducing stress while increasing sense of calm, control, and awareness if practiced regularly.  Clinician informed Travis [name] beforehand that if he became uncomfortable at any point during activity, he could stop and open his eyes.  Clinician invited him to get comfortable, achieve a relaxing breathing rhythm, close his eyes, and then guided him through process of creating a 'peaceful place' which filled him with safety and calm.  Clinician encouraged Travis [name] to include sensory details involving vision, sound, touch, smell, and taste which he considered pleasant to enhance experience.  After 10 minutes of practice in session, clinician invited Travis [name] to share his opinion on the activity, including whether he was able to imagine a specific place, what details stood out to him, and how this made him feel during and after.  Intervention was effective, as evidenced by Travis [name] participating in guided imagery activity successfully and reporting that it was a positive experience, as he felt more relaxed afterward, and was able to picture himself swimming in a cove in the [place] with sea turtles based upon a memory from a vacation some time ago.  He reported that he would plan to practice this technique as part of self-care routine, and follow up in 2 weeks. Clinician will continue to monitor.     Plan: Meet again in 2 weeks.   Diagnosis: Panic Disorder.   [name], [place], LCAS 05/01/2021

## 2021-05-15 ENCOUNTER — Other Ambulatory Visit: Payer: Self-pay

## 2021-05-15 ENCOUNTER — Ambulatory Visit (INDEPENDENT_AMBULATORY_CARE_PROVIDER_SITE_OTHER): Payer: 59 | Admitting: Licensed Clinical Social Worker

## 2021-05-15 DIAGNOSIS — F410 Panic disorder [episodic paroxysmal anxiety] without agoraphobia: Secondary | ICD-10-CM

## 2021-05-15 NOTE — Progress Notes (Signed)
THERAPIST PROGRESS NOTE   Session Time: 11:00am - 12:00pm      Location: Patient: [facility] [place] [facility] Therapist: [facility] [place] [facility]   Participation Level: Active   Behavioral Response: Alert, casually dressed, neatly groomed, euthymic mood/affect    Type of Therapy: Individual Therapy   Treatment Goals addressed: Coping with anxiety and managing panic attacks; Medication Compliance; Reducing anxiety related to travel   Interventions: CBT, problem solving, communication skills       Summary: Treyden Hakim is a 35 year old male mixed race male that presented today for therapy appointment with diagnosis of Panic Disorder.       Suicidal/Homicidal: None; without intent or plan.    Therapist Response:  Clinician met with [name] for in-person therapy appointment and assessed for safety, sobriety and medication compliance.  Ezri presented for today's session on time and was alert, oriented x5, with no evidence or self-report of active SI/HI or A/V H.  Oshen reported ongoing compliance with medication and denied any abuse of alcohol or illicit substances.  Clinician inquired about Davelle's emotional ratings today, as well as any significant changes in thoughts, feelings or behavior since last check-in.  Seferino reported scores of 0/10 for depression, 1/10 for anxiety, and 0/10 for anger/irritability.  Eldred denied any recent panic attacks.  Rithvik reported that things have been going well for him, as he travelled to [place] last week with a friend to visit his cousin, stating "It was a great trip.  I felt really carefree". He reported that traveling has made him nervous in the past, but he used his coping skills in order to manage anxieties effectively, stating "This was even easier than the last time".  He also reported that communication and support is positive in his current relationship.  Roper reported that one challenge has been receiving increased pressure to adjust boundaries and speak to  his mother from family members that do not understand how communication with her affects his mental health.  Sakib stated "I'm trying to figure out whether or not to get together with all of them and explain why I'm not ready".  Clinician assisted Keeyon in running cost benefit analysis regarding whether to request a family meeting in order to express his feelings on the matter at hand.  Clinician also reviewed material on communication skills with Hobert related to holding difficult conversations.  This included suggestions on how to be more assertive, but avoid conflict, such as saving the conversation for a calm moment, using gentle body language and tone of voice, utilizing "I" statements, describing the problem clearly, and making polite, respectful requests.  Interventions were effective, as evidenced by [name] participating in cost benefit analysis and reporting that at present, the benefits outweigh any downsides, and include the chance to strengthen relationships with key supports, increase their awareness into his particular mental health needs, strengthen his communication skills, and confidence in ability to speak up for himself.  Kenric was also receptive to review of skills sheet, and requested a printout of this to review as he prepares to speak with them soon.  Shlomie stated "I have to let them know that I'm not the only thing that's going to fix this problem right now and we have to work together if its going to get any better".  Clinician provided him with printout of skill sheet as requested, and will continue to monitor.     Plan: Meet again in 2 weeks.   Diagnosis: Panic Disorder.   [name],  LCSW, LCAS 05/15/2021

## 2021-05-29 ENCOUNTER — Other Ambulatory Visit: Payer: Self-pay

## 2021-05-29 ENCOUNTER — Ambulatory Visit (INDEPENDENT_AMBULATORY_CARE_PROVIDER_SITE_OTHER): Payer: 59 | Admitting: Licensed Clinical Social Worker

## 2021-05-29 DIAGNOSIS — F410 Panic disorder [episodic paroxysmal anxiety] without agoraphobia: Secondary | ICD-10-CM | POA: Diagnosis not present

## 2021-05-29 NOTE — Progress Notes (Signed)
THERAPIST PROGRESS NOTE   Session Time: 11:00am - 11:50am   Location: Patient: [facility] [place] [facility] Therapist: [facility] [place] [facility]   Participation Level: Active   Behavioral Response: Alert, casually dressed, neatly groomed, anxious mood/affect   Type of Therapy: Individual Therapy   Treatment Goals addressed: Coping with anxiety and managing panic attacks; Medication Compliance   Interventions: CBT, body scan, guided imagery    Summary: Travis [name] is a 35 year old male mixed race male that presented today for therapy appointment with diagnosis of Panic Disorder.       Suicidal/Homicidal: None; without intent or plan.    Therapist Response:  Clinician met with Travis [name] for in-person therapy session and assessed for safety, sobriety and medication compliance.  Travis [name] presented for today's appointment on time and was alert, oriented x5, with no evidence or self-report of active SI/HI or A/V H.  Travis [name] reported that he continues taking medication as prescribed and denied any abuse of alcohol or illicit substances.  Clinician inquired about Travis [name] current emotional ratings, as well as any significant changes in thoughts, feelings or behavior since previous check-in.  Travis [name] reported scores of 0/10 for depression, 3/10 for anxiety, and 0/10 for anger/irritability.  Travis [name] denied any recent panic attacks.  Travis [name] reported that he recently met with his doctor and they agreed to lower his venlafaxine to 184m.   Travis [name] "I feel like everything has been going really well, but over the past 2 days my anxiety was up to a 4 for some reason".  Clinician inquired about recent stressors which could be influencing his anxiety, and attempts to cope.  Travis [name] that he has been neglecting self-care to some extent, including not keeping up with scheduling massages, watching movies, or taking time to rest outside of work hours.  He reported that he reviewed his self-care assessment form and plans to  make effort to improve deficits in days ahead.  Travis [name] that things are going well in his relationship with his girlfriend, and they continue working to improve communication, stating "We are learning how to navigate our problems better".  He also reported that they have planned 2-3 vacations together in upcoming weeks for additional self-care and he is not as anxious about travelling.  Travis [name] that he has maintained current boundary with his mother, stating "I've thought about it a lot, but its still not the right time just yet".  Clinician praised AWilsonfor progress being made towards his goals, and suggested engagement in relaxation activity today to assist with decreasing present anxiety, which Travis [name] agreeable to.  Clinician provided him with options, and Travis [name] interest in guided imagery exercise involving a mountain setting.  Clinician guided Travis [name] process of getting comfortable, achieving relaxed breathing pattern, and then performing an initial body scan to identify any areas of tension related to stress retained in the body.  Clinician then instructed Travis [name] begin visualization of walking through a dense forest path, up a mountain side, and eventually resting at a clearing with a gentle stream and overlook of mountain tops.  Clinician included various pleasing sensory details (i.e. sound of running water from stream, feeling of gentle breeze, smell of pine, etc) throughout activity to enhance experience and increase sense of calm.  Clinician inquired about Travis [name] experience with activity afterward, including ability to focus on narration, and how this affected his overall emotional state.  Intervention was effective, as evidenced by Travis [name] engaging in activity and reporting that he had  no trouble focusing on instruction, and experiencing reduction in anxiety down to 1/10 in severity.  Travis [name] reported that this also made him reflect upon  how he 'rushed' his morning meditation today, and would benefit from checking in with himself more often, practicing coping skills, and avoiding taking on too many tasks related to work.  Clinician will continue to monitor.     Plan: Meet again in 2 weeks.   Diagnosis: Panic Disorder.   [name], [place], LCAS 05/29/2021

## 2021-06-19 ENCOUNTER — Other Ambulatory Visit: Payer: Self-pay

## 2021-06-19 ENCOUNTER — Ambulatory Visit (INDEPENDENT_AMBULATORY_CARE_PROVIDER_SITE_OTHER): Payer: 59 | Admitting: Licensed Clinical Social Worker

## 2021-06-19 DIAGNOSIS — F410 Panic disorder [episodic paroxysmal anxiety] without agoraphobia: Secondary | ICD-10-CM

## 2021-06-19 NOTE — Progress Notes (Signed)
THERAPIST PROGRESS NOTE   Session Time: 1:00pm - 2:00pm    Location: Patient: [facility] [place] [facility] Therapist: [facility] [place] [facility]   Participation Level: Active   Behavioral Response: Alert, casually dressed, neatly groomed, anxious mood/affect    Type of Therapy: Individual Therapy   Treatment Goals addressed: Coping with anxiety and managing panic attacks; Medication Compliance; Managing travel anxiety    Interventions: CBT, problem solving, stress management     Summary: Travis [name] is a 35 year old male mixed race male that presented today for therapy appointment with diagnosis of Panic Disorder.       Suicidal/Homicidal: None; without intent or plan.    Therapist Response:  Clinician met with Travis [name] for in-person therapy appointment and assessed for safety, sobriety and medication compliance.  Travis [name] presented for today's session on time and was alert, oriented x5, with no evidence or self-report of active SI/HI or A/V H.  Travis [name] reported ongoing compliance with medication and denied any abuse of alcohol or illicit substances.  Clinician inquired about Travis [name]'s emotional ratings today, as well as any significant changes in thoughts, feelings or behavior since last check-in.  Travis [name] reported scores of 0/10 for depression, 2/10 for anxiety, and 0/10 for anger/irritability.  Travis [name] denied any recent panic attacks, but noted that his anxiety has been gradually increasing over the past weeks and he has felt more on 'edge'.  Clinician discussed topic of anxiety triggers with Travis [name] today to help him gain insight into contributing factors influencing mood and explore solutions to manage stress more effectively.  Clinician inquired about internal and external triggers which Travis [name] might have experienced recently, in addition to discussing telltale physical and mental signs of panic that could serve as red flags to be mindful of when exposed to these triggers.  Clinician reviewed material with  Travis [name] on available coping skills that could be utilized to intervene appropriately when he becomes aware of increasing anxiety.  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  He reported that he will plan to use deep breathing, meditation, and practice assertive communication skills in order to resolve these ongoing issues more effectively.  Clinician also assisted Travis [name] in completing cost benefit analysis regarding whether or not it is an appropriate time to begin tapering down from current dosage with close supervision from his prescribing doctor.  Travis [name] actively participated in analysis and reported that at present, the benefits outweigh any negatives, including recued stigma among aviation community, increased chances of approval, more confidence in himself, and reliance upon natural coping skills for stress.  Interventions were effective, as evidenced by Travis [name] reporting that today's session helped increase insight into stressors which have been affecting his mood change, as well as reinforcing coping skills that he will plan to implement in days ahead to bring anxiety severity level down.  He also reported that he feels more assured regarding decision to taper down from medication following analysis, and will speak with doctor about starting this process on Thursday's appointment.  Clinician will continue to monitor.     Plan: Meet again in 2 weeks.   Diagnosis: Panic Disorder.   [name], LCSW, LCAS 06/19/2021

## 2021-07-03 ENCOUNTER — Other Ambulatory Visit: Payer: Self-pay

## 2021-07-03 ENCOUNTER — Ambulatory Visit (INDEPENDENT_AMBULATORY_CARE_PROVIDER_SITE_OTHER): Payer: 59 | Admitting: Licensed Clinical Social Worker

## 2021-07-03 DIAGNOSIS — F410 Panic disorder [episodic paroxysmal anxiety] without agoraphobia: Secondary | ICD-10-CM | POA: Diagnosis not present

## 2021-07-03 NOTE — Progress Notes (Signed)
THERAPIST PROGRESS NOTE   Session Time: 1:00pm - 2:00pm   Location: Patient: [facility] [place] [facility] Therapist: [facility] [place] [facility]   Participation Level: Active   Behavioral Response: Alert, casually dressed, neatly groomed, euthymic mood/affect    Type of Therapy: Individual Therapy   Treatment Goals addressed: Coping with anxiety and managing panic attacks; Followup with MD; Medication Compliance   Interventions: CBT, problem solving, assertive communication skills, and healthy boundaries    Summary: Travis [name] is a 35 year old male mixed race male that presented today for therapy appointment with diagnosis of Panic Disorder.       Suicidal/Homicidal: None; without intent or plan.    Therapist Response:  Clinician met with Travis [name] for in-person therapy session and assessed for safety, sobriety and medication compliance.  Travis [name] presented for today's appointment on time and was alert, oriented x5, with no evidence or self-report of active SI/HI or A/V H.  Travis [name] reported that he continues taking medication responsibly and met with MD 2 weeks ago, who lowered his venlafaxine down to 8m.  ABruindenied any abuse of alcohol or illicit substances.  Clinician inquired about Travis [name]'s current emotional ratings, as well as any significant changes in thoughts, feelings or behavior since previous check-in.  Travis [name] scores of 0/10 for depression, 1/10 for anxiety, and 0/10 for anger/irritability.  Travis [name] any recent panic attacks or outbursts.  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  Travis [name] that there have been two issues he wished to process in therapy today.  He reported that the first one involved a disagreement he had with his  partner on their most recent trip.  Clinician reviewed assertive communication skills with Travis [name] reinforce positive changes he is making in communication style, as well as areas of improvement that could be focused upon to strengthen understanding and support within his relationship.  This also included review of communication traps (i.e. criticism, stonewalling, etc) that might have been present and contributed to their recent misunderstanding.  Travis [name] that he did initially feel criticized by his partner and resorted to stonewalling because he was hurt, but later chose a quiet moment to express his feelings calmly to his partner with "I" statements, which she was receptive to, and led to an apology he accepted.  Travis [name] that in the future he would try to focus more on active listening and avoid trying to 'fix' the situation if feedback isn't asked of him.  Travis [name] that an additional issue is trying to determine whether to attend a family event his mother invited him to, since he isn't sure whether he is ready to adjust boundaries between them yet.  Clinician assisted Travis [name] running cost benefit analysis regarding whether to agree to attend this event based upon state of their repairing relationship and his ability to cope with any issues that could arise.  Travis [name] in analysis, and reported that at this time, he does not feel ready to spend time with his mother in person since this could trigger a panic attack, and will politely decline offer.  Clinician also discussed how to decline Travis [name]'s mother's offer in an assertive, respectful way in order to reduce chance of conflict between them as he has faced in the  past.  Interventions were effective, as evidenced by Travis [name] stating "This helped me figure out how to approach my mom about our boundaries with spending time together, reinforced my new communication skills with my partner, and reassured me that I'm  handling challenges better in my life".  Clinician will continue to monitor.     Plan: Meet again in 2 weeks.   Diagnosis: Panic Disorder.   [name], [place], LCAS 07/03/2021

## 2021-07-17 ENCOUNTER — Ambulatory Visit (INDEPENDENT_AMBULATORY_CARE_PROVIDER_SITE_OTHER): Payer: 59 | Admitting: Licensed Clinical Social Worker

## 2021-07-17 ENCOUNTER — Other Ambulatory Visit: Payer: Self-pay

## 2021-07-17 DIAGNOSIS — F410 Panic disorder [episodic paroxysmal anxiety] without agoraphobia: Secondary | ICD-10-CM

## 2021-07-17 NOTE — Progress Notes (Signed)
THERAPIST PROGRESS NOTE   Session Time: 1:00pm - 2:00pm   Location: Patient: [facility] [place] [facility] Therapist: [facility] [place] [facility]   Participation Level: Active   Behavioral Response: Alert, casually dressed, neatly groomed, euthymic mood/affect    Type of Therapy: Individual Therapy   Treatment Goals addressed: Coping with anxiety and managing panic attacks; Medication Compliance   Interventions: CBT, harm reduction, psychoeducation on marijuana effects   Summary: Travis [name] is a 35 year old male mixed race male that presented today for therapy appointment with diagnosis of Panic Disorder.       Suicidal/Homicidal: None; without intent or plan.    Therapist Response:  Clinician met with [name] for in-person therapy appointment and assessed for safety, sobriety and medication compliance.  [name] presented for today's session on time and was alert, oriented x5, with no evidence or self-report of active SI/HI or A/V H.  [name] reported ongoing compliance with medication and denied any abuse of alcohol.  Clinician inquired about [name]'s emotional ratings today, as well as any significant changes in thoughts, feelings or behavior since last check-in.  [name] reported scores of 0/10 for depression, 4/10 for anxiety, and 0/10 for anger/irritability.  [name] denied any recent panic attacks or outbursts.  [name] reported that he wanted to discuss marijuana use he has not disclosed with clinician since starting therapy.  Clinician inquired about how often/how much [name] is smoking, and his insight into consequences that have resulted from use of this substance.  [name] reported that he is smoking at least one joint every evening, stating "It helps me relax, but some mornings when I wake up I don't always feel like getting up, so I think it might affect my motivation".  Clinician encouraged [name] to abstain from illicit substance use, and provided psychoeducation on the various psychological and  physical side effects that could result from continued use of THC in order to enhance his motivation.  This included short terms effects such as coordination issues, distorted senses, increased anxiety, increased heart rate, issues with attention, memory, learning and paranoia, in addition to long term effects such as risk of dependence, and chronic cough.  Clinician encouraged [name] to consider substituting THC use for healthier coping skills, such as practice of grounding and relaxation skills that have been previously offered.  Clinician also discussed strategies for tapering down from this substance, including harm reduction approach.  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  Clinician will continue to monitor.        Plan: Meet again in 2 weeks.   Diagnosis: Panic Disorder.   [name], LCSW, LCAS 07/17/2021

## 2021-08-02 ENCOUNTER — Ambulatory Visit (INDEPENDENT_AMBULATORY_CARE_PROVIDER_SITE_OTHER): Payer: 59 | Admitting: Licensed Clinical Social Worker

## 2021-08-02 ENCOUNTER — Other Ambulatory Visit: Payer: Self-pay

## 2021-08-02 DIAGNOSIS — F410 Panic disorder [episodic paroxysmal anxiety] without agoraphobia: Secondary | ICD-10-CM

## 2021-08-02 NOTE — Progress Notes (Signed)
THERAPIST PROGRESS NOTE   Session Time: 2:00pm - 3:00pm   Location: Patient: [facility] [place] [facility] Therapist: [facility] [place] [facility]   Participation Level: Active   Behavioral Response: Alert, casually dressed, neatly groomed, depressed mood/affect    Type of Therapy: Individual Therapy   Treatment Goals addressed: Coping with anxiety and managing panic attacks; Medication Compliance   Interventions: CBT; psychoeducation on healthy boundaries    Summary: Travis [name] is a 35 year old male mixed race male that presented today for therapy appointment with diagnosis of Panic Disorder.       Suicidal/Homicidal: None; without intent or plan.    Therapist Response:  Clinician met with [name] for in-person therapy session and assessed for safety, sobriety and medication compliance.  Kaidence presented for today's appointment on time and was alert, oriented x5, with no evidence or self-report of active SI/HI or A/V H.  Vansh reported that he continues taking medication as prescribed and is continuing to taper down with supervision from his doctor.  Neeraj denied any abuse of alcohol.  He reported that he has only used THC twice since last check in, stating "They were social events, not in the evening alone to relax like I used to.  That's been a good change".  Clinician inquired about Hilario's current emotional ratings, as well as any significant changes in thoughts, feelings or behavior since previous check-in.  Edrian reported scores of 2/10 for depression, 0/10 for anxiety, and 0/10 for anger/irritability.  Levander denied any recent panic attacks or outbursts.  Megan reported that his biggest challenge at present affecting mood is trying to decide what to do about his current relationship, as his partner has been spending more time with a friend that does not approve of him, and this is driving a wedge between them.  Clinician discussed topic of boundaries with Timonthy today to assist.  Clinician utilized  a handout on the subject which featured various categories present within typical relationship, including physical, intellectual, emotional, sexual, material, and time.  Healthy versus unhealthy traits were presented for each category to gauge quality of boundaries within the relationship (i.e. respecting a partner's unique thoughts and ideas in regard to healthy intellectual boundaries instead of dismissing or belittling one's beliefs).  Clinician inquired about areas of concern within the present relationship's boundaries, and discussed strategies for assertively communicating these issues to seek resolution.  Intervention was effective, as evidenced by [name] participating in discussion on the subject, and reporting greater insight into boundary issues that lie within the relationship, including emotional boundary violations due to having his feelings consistently invalidated, physical boundaries being inconsistent, and time boundaries shifting more in favor of the partner's needs over his own.  Nassir reported that he will recommend that they take a break for the time being to see if space will help, and focus more on self-care to counter recent increase in depression.  Rollan stated "It was good to have an unbiased perspective on the situation and made me feel like I'm taking the right steps to try and work through this right now".  Clinician will continue to monitor.        Plan: Meet again in 2 weeks.   Diagnosis: Panic Disorder.   [name], LCSW, LCAS 08/02/2021

## 2021-08-04 IMAGING — DX DG CHEST 2V
2 series · 2 of 2 positions shown · non-contrast
Comparison: Chest radiograph dated 10/09/2019

CLINICAL DATA: Chest pain

EXAM:
CHEST - 2 VIEW

[w chest pa]
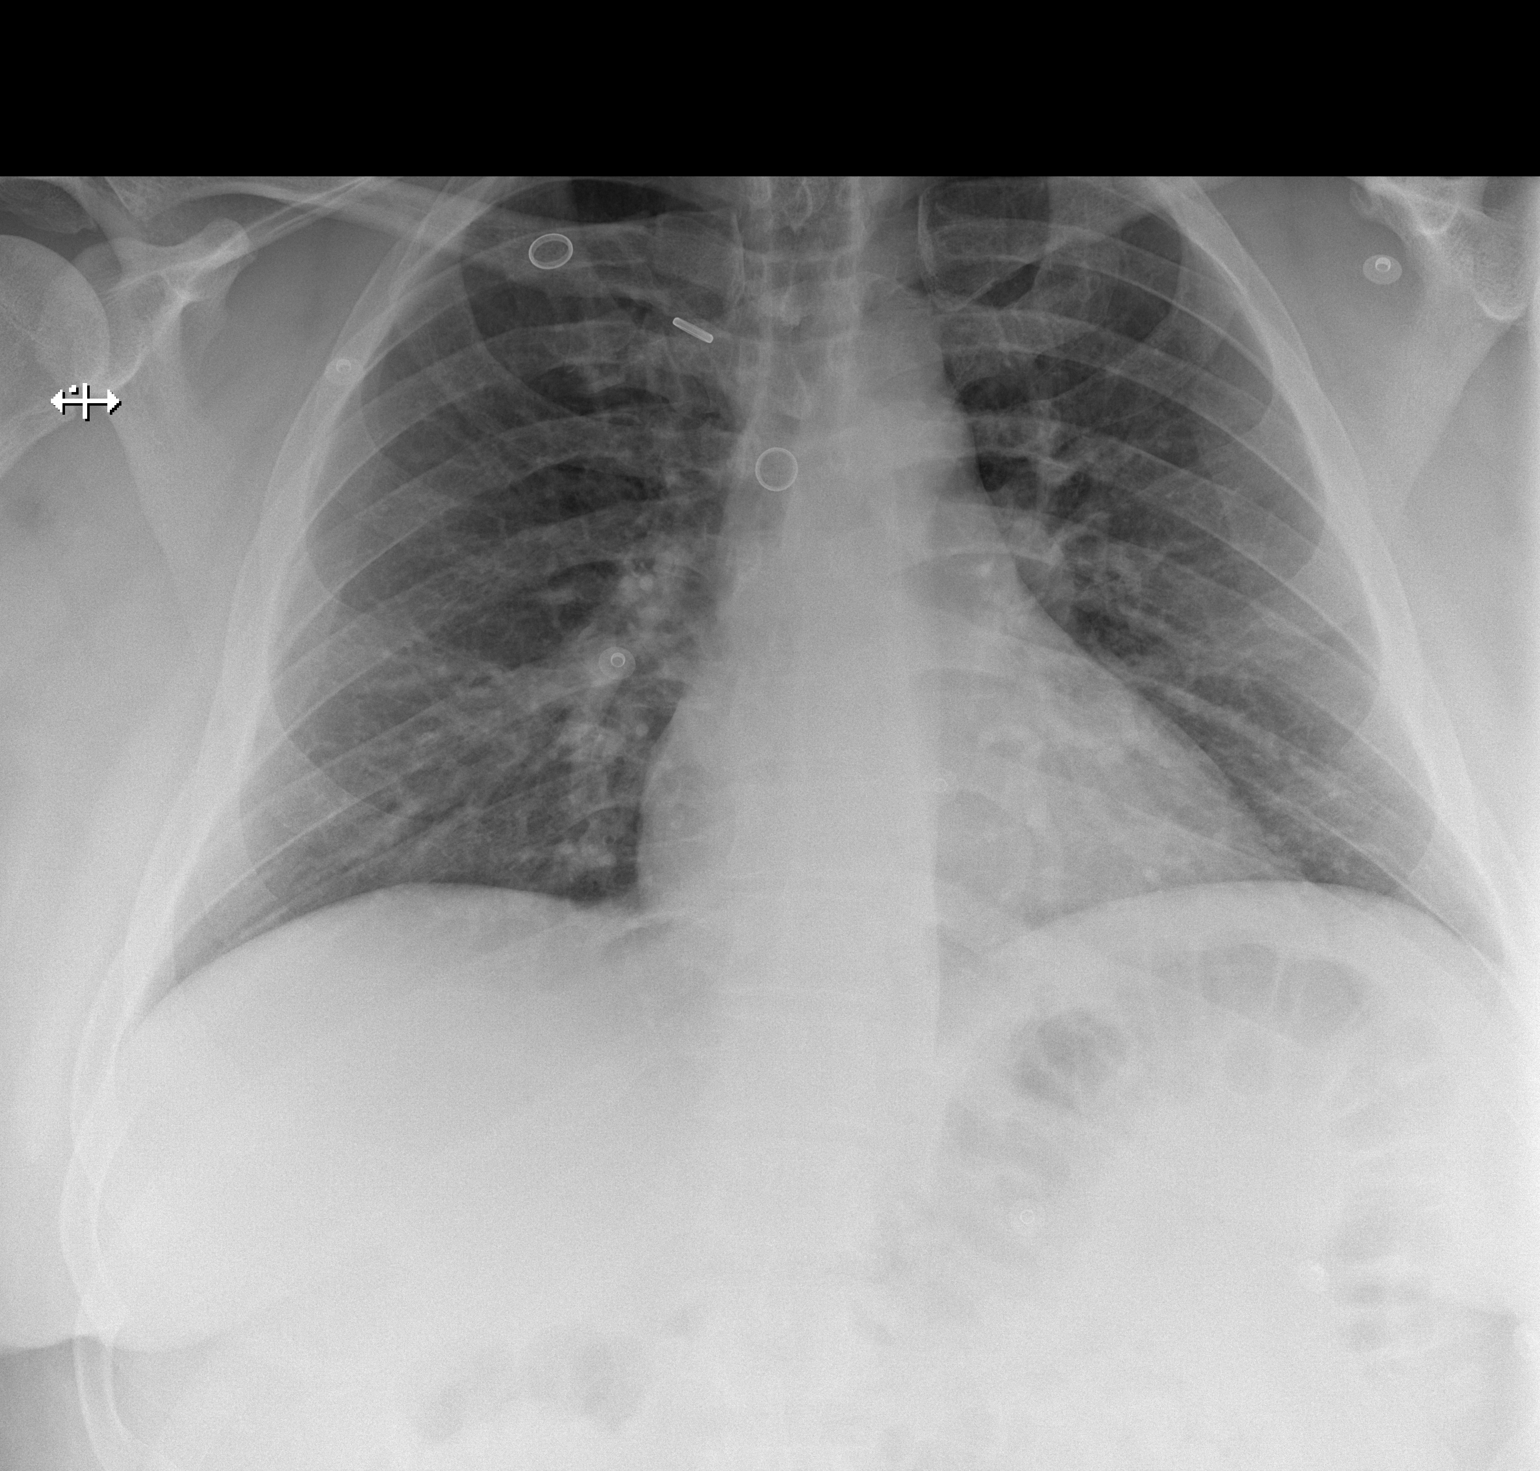

[w chest lat]
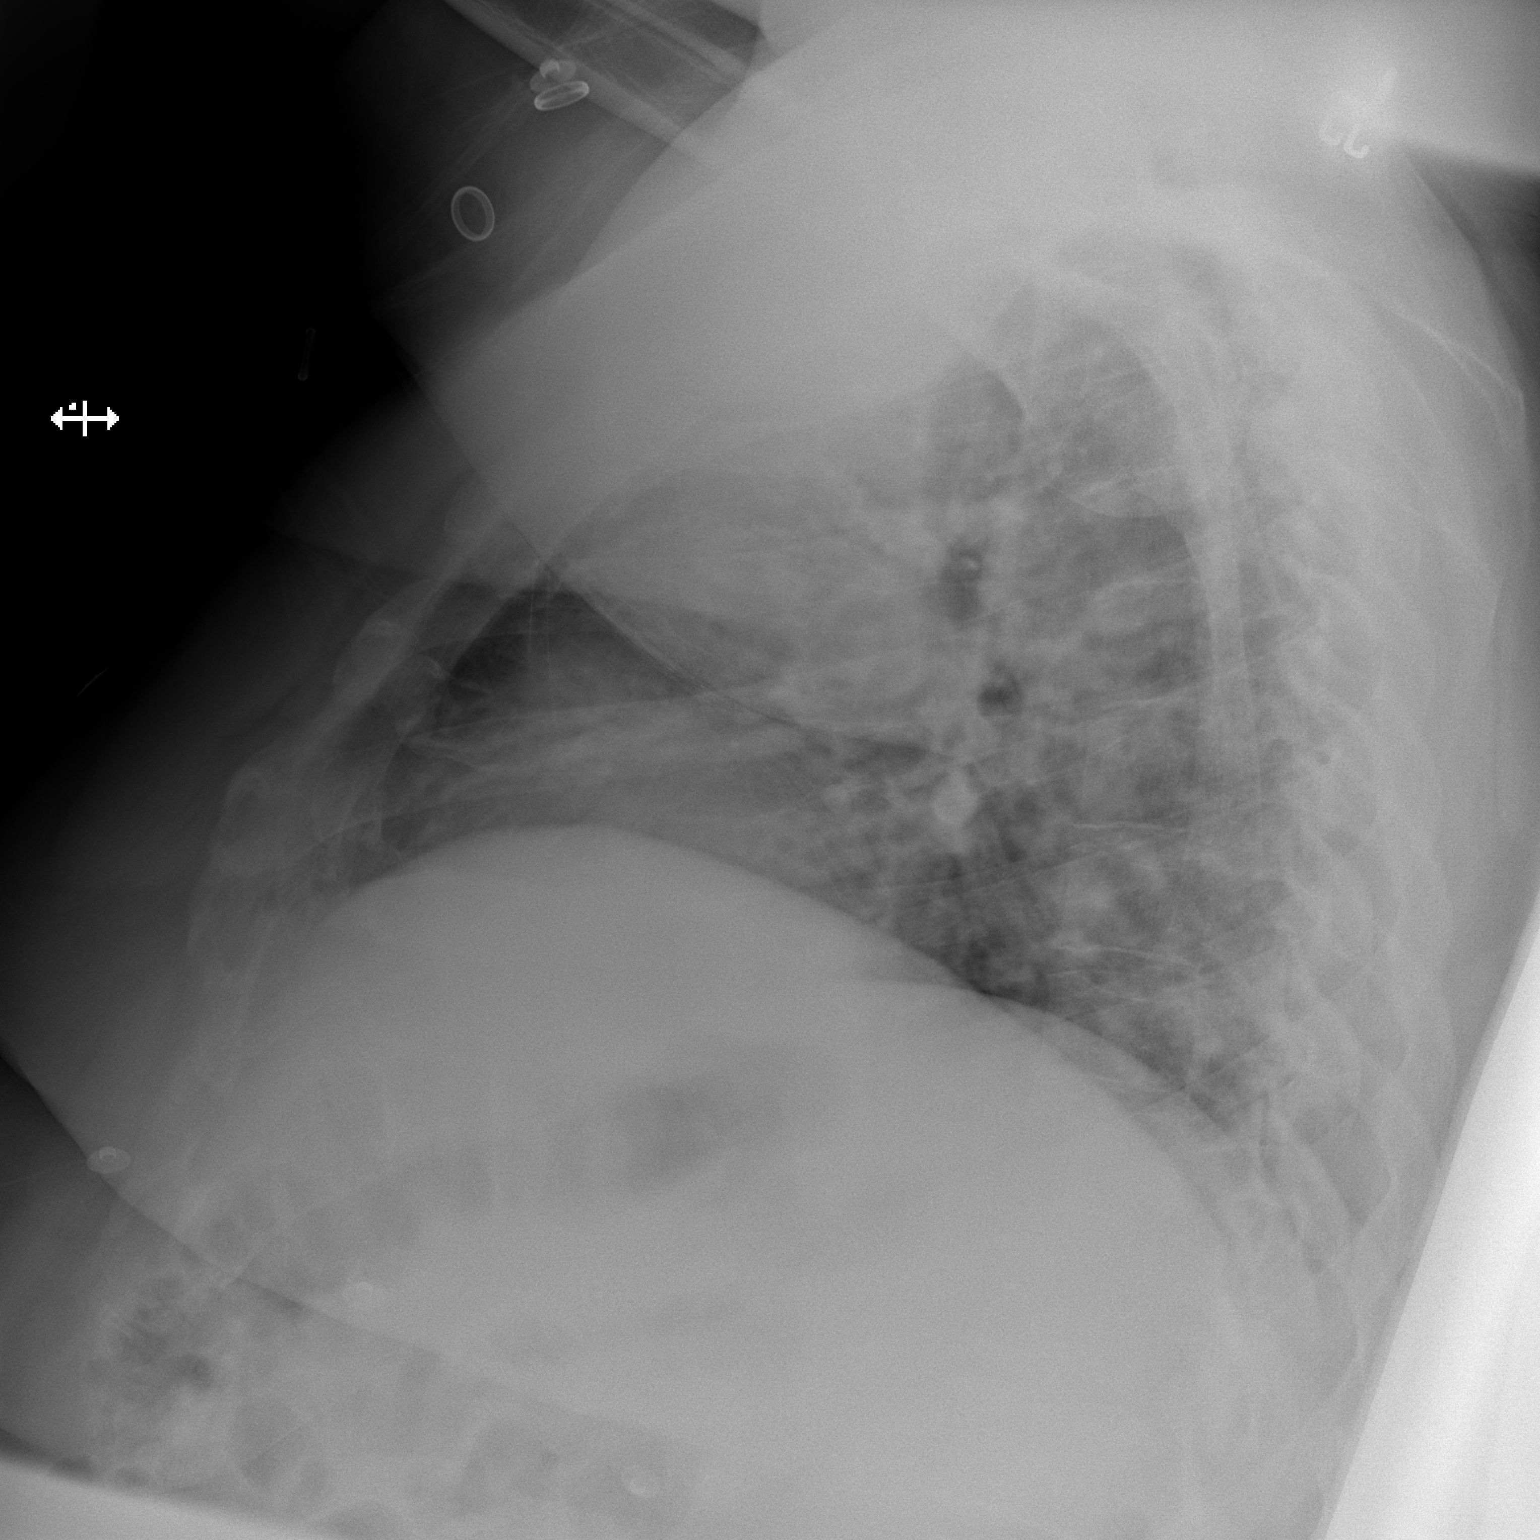

[2 of 2 positions shown; findings below may reference images not displayed]

FINDINGS: The heart size and mediastinal contours are within normal limits.
Both lungs are clear. The visualized skeletal structures are
unremarkable.
IMPRESSION: No active cardiopulmonary disease.

## 2021-08-06 ENCOUNTER — Ambulatory Visit (INDEPENDENT_AMBULATORY_CARE_PROVIDER_SITE_OTHER): Payer: 59 | Admitting: Family Medicine

## 2021-08-06 ENCOUNTER — Encounter: Payer: Self-pay | Admitting: Family Medicine

## 2021-08-06 ENCOUNTER — Other Ambulatory Visit (HOSPITAL_COMMUNITY)
Admission: RE | Admit: 2021-08-06 | Discharge: 2021-08-06 | Disposition: A | Discharge status: Home / Self Care | Payer: 59 | Source: Ambulatory Visit | Attending: Family Medicine | Admitting: Family Medicine

## 2021-08-06 ENCOUNTER — Other Ambulatory Visit: Payer: Self-pay

## 2021-08-06 VITALS — BP 120/70 | HR 82 | Temp 98.2°F | Ht 77.0 in | Wt 380.2 lb

## 2021-08-06 DIAGNOSIS — Z113 Encounter for screening for infections with a predominantly sexual mode of transmission: Secondary | ICD-10-CM | POA: Diagnosis present

## 2021-08-06 DIAGNOSIS — Z114 Encounter for screening for human immunodeficiency virus [HIV]: Secondary | ICD-10-CM | POA: Diagnosis not present

## 2021-08-06 DIAGNOSIS — Z0000 Encounter for general adult medical examination without abnormal findings: Secondary | ICD-10-CM

## 2021-08-06 LAB — COMPREHENSIVE METABOLIC PANEL
ALT: 37 U/L (ref 0–53)
AST: 18 U/L (ref 0–37)
Albumin: 4.7 g/dL (ref 3.5–5.2)
Alkaline Phosphatase: 42 U/L (ref 39–117)
BUN: 10 mg/dL (ref 6–23)
CO2: 28 mEq/L (ref 19–32)
Calcium: 9.4 mg/dL (ref 8.4–10.5)
Chloride: 101 mEq/L (ref 96–112)
Creatinine, Ser: 0.85 mg/dL (ref 0.40–1.50)
GFR: 112.75 mL/min (ref >60.00)
Glucose, Bld: 104 mg/dL — ABNORMAL HIGH (ref 70–99)
Potassium: 4.2 mEq/L (ref 3.5–5.1)
Sodium: 138 mEq/L (ref 135–145)
Total Bilirubin: 0.8 mg/dL (ref 0.2–1.2)
Total Protein: 7.2 g/dL (ref 6.0–8.3)

## 2021-08-06 LAB — CBC
HCT: 46.1 % (ref 39.0–52.0)
Hemoglobin: 15.4 g/dL (ref 13.0–17.0)
MCHC: 33.4 g/dL (ref 30.0–36.0)
MCV: 85.2 fl (ref 78.0–100.0)
Platelets: 196 10*3/uL (ref 150.0–400.0)
RBC: 5.41 Mil/uL (ref 4.22–5.81)
RDW: 12.9 % (ref 11.5–15.5)
WBC: 3.8 10*3/uL — ABNORMAL LOW (ref 4.0–10.5)

## 2021-08-06 LAB — LIPID PANEL
Cholesterol: 208 mg/dL — ABNORMAL HIGH (ref 0–200)
HDL: 55.6 mg/dL (ref >39.00)
LDL Cholesterol: 133 mg/dL — ABNORMAL HIGH (ref 0–99)
NonHDL: 152.87
Total CHOL/HDL Ratio: 4
Triglycerides: 99 mg/dL (ref 0.0–149.0)
VLDL: 19.8 mg/dL (ref 0.0–40.0)

## 2021-08-06 LAB — HIV ANTIBODY (ROUTINE TESTING W REFLEX): HIV 1&2 Ab, 4th Generation: NONREACTIVE

## 2021-08-06 MED ORDER — VENLAFAXINE HCL ER 37.5 MG PO CP24: 37.5000 mg | ORAL_CAPSULE | Freq: Every day | ORAL | Status: DC

## 2021-08-06 MED ORDER — FLUTICASONE PROPIONATE 50 MCG/ACT NA SUSP: 2.0000 | Freq: Every day | NASAL | 2 refills | Status: DC

## 2021-08-06 MED ORDER — LEVOCETIRIZINE DIHYDROCHLORIDE 5 MG PO TABS: 5.0000 mg | ORAL_TABLET | Freq: Every evening | ORAL | 2 refills | Status: DC

## 2021-08-06 NOTE — Patient Instructions (Addendum)
Give [name] 2-3 business days to get the results of your labs back.   Keep the diet clean and stay active.  Do monthly self testicular checks in the shower. You are feeling for lumps/bumps that don't belong. If you feel anything like this, let me know!  I recommend getting the updated bivalent covid vaccination booster at your convenience.   If you do not hear anything about your referral in the next 1-2 weeks, call our office and ask for an update.  The only lifestyle changes that have data behind them are weight loss for the overweight/obese and elevating the head of the bed. Finding out which foods/positions are triggers is important.  Let us know if you need anything.

## 2021-08-06 NOTE — Progress Notes (Signed)
Chief Complaint  Patient presents with   Annual Exam    Well Male Travis [name] is here for a complete physical.   His last physical was >1 year ago.  Current diet: in general, fairly healthy diet.   Current exercise: walking, hiking Weight trend: stable lately Fatigue out of ordinary? No. Seat belt? Yes.    Health maintenance Tetanus- Yes HIV- Yes Hep C- Yes  Past Medical History:  Diagnosis Date   ADD (attention deficit disorder)    Asthma    childhood   Asthma    Chest pain at rest 05/17/2020   Class 3 severe obesity with serious comorbidity and body mass index (BMI) of 45.0 to 49.9 in adult [facility]) 07/28/2019   Colon polyps    Essential hypertension 03/20/2020   Hip pain    HTN (hypertension)    Insulin resistance 05/25/2020   Intermittent palpitations 05/17/2020   Joint pain    Left hip pain 07/28/2019   Morbid obesity ([place]) 05/17/2020   Near syncope 05/17/2020   Other insomnia 04/03/2020   Overweight    Restless legs syndrome (RLS) 05/17/2020   Snoring 05/17/2020   Vitamin D deficiency 03/20/2020     Past Surgical History:  Procedure Laterality Date   arm Right    TONSILLECTOMY     WISDOM TOOTH EXTRACTION      Medications  Current Outpatient Medications on File Prior to Visit  Medication Sig Dispense Refill   diphenhydrAMINE (BENADRYL) 50 MG capsule Take 50 mg by mouth daily.     sildenafil (VIAGRA) 100 MG tablet Take 0.5-1 tablets (50-100 mg total) by mouth daily as needed for erectile dysfunction. 30 tablet 2   clonazePAM (KLONOPIN) 0.5 MG tablet Take 1 tablet (0.5 mg total) by mouth 2 (two) times daily as needed for anxiety. 20 tablet 0   diclofenac (VOLTAREN) 75 MG EC tablet Take 1 tablet (75 mg total) by mouth 2 (two) times daily. 30 tablet 0   DOXEPIN HCL PO Take 50 mg by mouth.     predniSONE (STERAPRED UNI-PAK 21 TAB) 10 MG (21) TBPK tablet Use as directed 21 tablet 0   traZODone (DESYREL) 50 MG tablet Take 50 mg by mouth. 1-2 TABLETS AT BEDTIME      venlafaxine XR (EFFEXOR-XR) 150 MG 24 hr capsule Take 150 mg by mouth daily.     venlafaxine XR (EFFEXOR-XR) 75 MG 24 hr capsule Take 1 capsule by mouth daily.      Vitamin D, Ergocalciferol, (DRISDOL) 1.25 MG (50000 UNIT) CAPS capsule Take 1 capsule (50,000 Units total) by mouth every 7 (seven) days. 12 capsule 0    Allergies No Known Allergies  Family History Family History  Problem Relation Age of Onset   Obesity Mother    Mental illness Mother    Depression Mother    Bipolar disorder Mother    Sleep apnea Mother    Alcoholism Mother    Eating disorder Mother    Irritable bowel syndrome Sister    Hearing loss Maternal Grandmother     Review of Systems: Constitutional: no fevers or chills Eye:  no recent significant change in vision Ear/Nose/Mouth/Throat:  Ears:  no hearing loss Nose/Mouth/Throat:  no complaints of nasal congestion, no sore throat Cardiovascular:  no chest pain Respiratory:  no shortness of breath Gastrointestinal:  no abdominal pain, no change in bowel habits GU:  Male: negative for dysuria Musculoskeletal/Extremities:  no pain of the joints Integumentary (Skin/Breast): +lump under R breast; otherwise no abnormal  skin lesions reported Neurologic:  no headaches Endocrine: No unexpected weight changes Hematologic/Lymphatic:  no night sweats  Exam BP 120/70   Pulse 82   Temp 98.2 F (36.8 C) (Oral)   Ht 6\' 5"  (1.956 m)   Wt (!) 380 lb 4 oz (172.5 kg)   SpO2 98%   BMI 45.09 kg/m  General:  well developed, well nourished, in no apparent distress Skin: Rubbery and freely moveable lesion on R chest wall under breast.  no significant moles, warts, or growths Head:  no masses, lesions, or tenderness Eyes:  pupils equal and round, sclera anicteric without injection Ears:  canals without lesions, TMs shiny without retraction, no obvious effusion, no erythema Nose:  nares patent, septum midline, mucosa normal Throat/Pharynx:  lips and gingiva without  lesion; tongue and uvula midline; non-inflamed pharynx; no exudates or postnasal drainage Neck: neck supple without adenopathy, thyromegaly, or masses Lungs:  clear to auscultation, breath sounds equal bilaterally, no respiratory distress Cardio:  regular rate and rhythm, no bruits, no LE edema Abdomen:  abdomen soft, nontender; bowel sounds normal; no masses or organomegaly Genital (male): Deferred Rectal: Deferred Musculoskeletal:  symmetrical muscle groups noted without atrophy or deformity Extremities:  no clubbing, cyanosis, or edema, no deformities, no skin discoloration Neuro:  gait normal; deep tendon reflexes normal and symmetric Psych: well oriented with normal range of affect and appropriate judgment/insight  Assessment and Plan  Well adult exam - Plan: Comprehensive metabolic panel, Lipid panel, CBC  Morbid obesity ([place]) - Plan: Amb Referral to Bariatric Surgery  Screening for HIV (human immunodeficiency virus) - Plan: HIV Antibody (routine testing w rflx)  Routine screening for STI (sexually transmitted infection) - Plan: Urine cytology ancillary only([place])   Well 35 y.o. male. Counseled on diet and exercise. Self testicular exams recommended at least monthly.  Refer bariatric surg team for consultation. Covid bivalent booster rec'd.  Doing well w psychiatry.  Other orders as above. Follow up in 1 year pending the above workup. The patient voiced understanding and agreement to the plan.  [place], DO 08/06/21 10:30 AM

## 2021-08-07 LAB — URINE CYTOLOGY ANCILLARY ONLY
Chlamydia: NEGATIVE
Comment: NEGATIVE
Comment: NEGATIVE
Comment: NORMAL
Neisseria Gonorrhea: NEGATIVE
Trichomonas: NEGATIVE

## 2021-09-06 ENCOUNTER — Other Ambulatory Visit: Payer: Self-pay

## 2021-09-06 ENCOUNTER — Ambulatory Visit (INDEPENDENT_AMBULATORY_CARE_PROVIDER_SITE_OTHER): Payer: 59 | Admitting: Licensed Clinical Social Worker

## 2021-09-06 DIAGNOSIS — F410 Panic disorder [episodic paroxysmal anxiety] without agoraphobia: Secondary | ICD-10-CM | POA: Diagnosis not present

## 2021-09-06 NOTE — Progress Notes (Signed)
THERAPIST PROGRESS NOTE   Session Time: 2:00pm - 2:50pm   Location: Patient: [facility] [place] [facility] Therapist: [facility] [place] [facility]   Participation Level: Active   Behavioral Response: Alert, casually dressed, neatly groomed, irritable mood/affect    Type of Therapy: Individual Therapy   Treatment Goals addressed: Coping with anxiety and managing panic attacks; Medication Compliance; Managing travel anxiety    Interventions: CBT, treatment planning   Summary: Travis [name] is a 35 year old male mixed race male that presented today for therapy appointment with diagnosis of Panic Disorder.       Suicidal/Homicidal: None; without intent or plan.    Therapist Response:  Clinician met with Travis [name] for in-person therapy appointment and assessed for safety, sobriety and medication compliance.  Travis [name] presented for today's session on time and was alert, oriented x5, with no evidence or self-report of active SI/HI or A/V H.  Travis [name] reported ongoing compliance with medication and denied any abuse of alcohol.  He stated "I have drastically cut down on weed smoking".  He reported that he is smoking marijuana x1 per week on average now, with no noticeable consequences.  Clinician inquired about Travis [name]'s emotional ratings today, as well as any significant changes in thoughts, feelings or behavior since last check-in.  Travis [name] reported scores of 0/10 for depression, 0/10 for anxiety, and 2/10 for irritability.  Travis [name] denied any recent panic attacks or outbursts.  Travis [name] reported that a recent success was working through issues with his girlfriend, stating "We mended our fences, talked it out and I think our spark is coming back".  He reported that another success was travelling to [place] to visit a friend with minor anticipatory anxiety.  [name] denied any recent struggles with the exception of enforcing boundaries with family members to ensure adequate time for self-care.  Clinician recommended revisiting treatment  plan with Travis [name] today in order to identify progress towards goals, as well as barriers.  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; Maintain average anxiety level at 0-1/10 and frequency of panic attacks at 0 per month over next 90 days by practicing 3-4 relaxation and/or grounding skills daily when related symptoms arise, including mindful breathing, progressive muscle relaxation, 5-4-3-2-1 grounding, etc; Set aside 30 minutes per day for positive self-care activities in order to ensure healthy work/life balance each week and adequate time to relax outside of busy schedule; Continue documenting details of panic episodes in journal entries should they arise in order to increase insight into triggers that could be influencing mood instability, as well as weighing effectiveness of various techniques employed during crisis; Commit to exercising x3 per week for 30 minutes on average in order to improve physical and mental fitness, in addition to confidence; Maintain use of 4-5 sleep hygiene techniques in order to ensure average nightly rest at 7-8 hours over next 90 days and reduce related fatigue/irritability; Continue showing willingness to engage in travel opportunities in order to curb related anxiety level in these scenarios from 3/10 down to a 1/10 and avoid isolation; Utilize therapy sessions as needed in order to enhance communication style, set  appropriate boundaries, and reduce conflict with support system; Weigh costs and benefits of bariatric surgery in order to improve  health and wellbeing over course of next 6 months; Voluntarily seek hospitalization with assistance from support system and/or medical professionals should SI/HI appear and safety of self or others is determined at risk due to development of intent, plan, and access to means necessary to carry out plan.  Progress is evidenced by Travis [name] engaging in treatment planning process, consistently attending psychiatry appointments, taking medication as prescribed and beginning supervised taper due to increased stability, reporting decreased anxiety severity, no reoccurrence of panic episodes, effective use of coping skills to manage anxiety, keeping up with exercise routine, increasing rest to 7-8 hours nightly, and attending trips out of state with reduced anxiety.  Travis [name] stated "It feels good to look back on the progress and see where I'm growing".  Clinician will continue to monitor.        Plan: Meet again in 2 weeks.   Diagnosis: Panic Disorder.   [name], LCSW, LCAS 09/06/2021

## 2021-09-18 ENCOUNTER — Other Ambulatory Visit: Payer: Self-pay

## 2021-09-18 ENCOUNTER — Ambulatory Visit (INDEPENDENT_AMBULATORY_CARE_PROVIDER_SITE_OTHER): Payer: 59 | Admitting: Licensed Clinical Social Worker

## 2021-09-18 DIAGNOSIS — F410 Panic disorder [episodic paroxysmal anxiety] without agoraphobia: Secondary | ICD-10-CM
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
## 2021-10-02 ENCOUNTER — Ambulatory Visit (HOSPITAL_COMMUNITY): Payer: 59 | Admitting: Licensed Clinical Social Worker

## 2021-10-02 ENCOUNTER — Other Ambulatory Visit: Payer: Self-pay

## 2021-10-04 ENCOUNTER — Other Ambulatory Visit: Payer: Self-pay

## 2021-10-04 ENCOUNTER — Ambulatory Visit (INDEPENDENT_AMBULATORY_CARE_PROVIDER_SITE_OTHER): Payer: 59 | Admitting: Licensed Clinical Social Worker

## 2021-10-04 DIAGNOSIS — F410 Panic disorder [episodic paroxysmal anxiety] without agoraphobia: Secondary | ICD-10-CM | POA: Diagnosis not present

## 2021-10-04 NOTE — Progress Notes (Signed)
THERAPIST PROGRESS NOTE   Session Time: 3:00pm - 4:00pm   Location: Patient: [facility] Therapist: [facility]   Participation Level: Active   Behavioral Response: Alert, casually dressed, neatly groomed, euthymic mood/affect    Type of Therapy: Individual Therapy   Treatment Goals addressed: Coping with anxiety and managing panic attacks; Monitoring marijuana use    Interventions: CBT, urge surfing    Summary: Siyon Linck is a 35 year old male mixed race male that presented today for therapy appointment with diagnosis of Panic Disorder.       Suicidal/Homicidal: None; without intent or plan.    Therapist Response:  Clinician met with [name] for in-person therapy session and assessed for safety, and sobriety.  Lucius presented for today's appointment on time and was alert, oriented x5, with no evidence or self-report of active SI/HI or A/V H.  Kaige denied any abuse of alcohol.  He stated "I am using marijuana once or twice a month, maybe 1 puff or 2".  Clinician inquired about Valery's current emotional ratings, as well as any significant changes in thoughts, feelings or behavior since previous check-in.  Jaskaran reported scores of 0/10 for depression, 0/10 for anxiety, and 0/10 for anger/irritability.  Issacc denied any recent panic attacks or outbursts.  Cyris reported that a recent success was expressing his feelings assertively to his girlfriend when their mutual Christmas plans fell apart, stating I had been talking to her for a few weeks about it because I was worried, but I expressed my disappointment and frustration and I think we both heard each other out and have a better understanding.  Clinician praised Percival for effective use of communication skills and inquired about what he would like to focus today's session on.  Hollister reported that over the holiday weekend he did experience some urges to smoke marijuana when stress was higher and wished to learn new  skills to manage these successfully.  Clinician praised Duy for efforts in reducing use of marijuana, and shared a handout with him during session on a technique called 'urge surfing' he could utilize to avoid engagement in unwanted behaviors such as substance use.  This handout explained how urges work via exposure to a trigger, with a gradual increase of intensity until a peak is reached, and then decline.  Suggestions were offered to aid in successfully 'riding out the urge' to avoid reinforcing maladaptive behaviors.  These included practicing mindfulness in order to acknowledge thoughts/feelings without trying to change or suppress them, planning ahead to reduce exposure to notable triggers in the future, and engaging in healthier distraction techniques such as visualization or deep breathing in order to redirect focus from the urge.  Intervention was effective, as evidenced by [name] participating in urge surfing activity by imagining a triggering situation where he would be tempted to smoke, and practicing the skills discussed to reduce the urge successfully from a 6/10 initially, down to a 1/10.  Eugune expressed interest in practicing this skill in free time and was provided with a copy of today's handout, stating This helped me realize the connection between my triggers and urges.  I need to pay attention to my feelings and avoid suppressing them.  Clinician will continue to monitor.             Plan: Meet again in 2 weeks.   Diagnosis: Panic Disorder.   [name], [place], LCAS 10/04/2021

## 2021-10-16 ENCOUNTER — Ambulatory Visit (HOSPITAL_COMMUNITY): Payer: 59 | Admitting: Licensed Clinical Social Worker

## 2021-10-16 ENCOUNTER — Other Ambulatory Visit: Payer: Self-pay

## 2021-10-24 ENCOUNTER — Encounter: Payer: Self-pay | Admitting: Family Medicine

## 2021-10-24 ENCOUNTER — Ambulatory Visit: Payer: Self-pay | Admitting: Family Medicine

## 2021-10-24 ENCOUNTER — Ambulatory Visit (INDEPENDENT_AMBULATORY_CARE_PROVIDER_SITE_OTHER): Payer: 59 | Admitting: Family Medicine

## 2021-10-24 VITALS — BP 142/84 | HR 76 | Temp 98.2°F | Ht 77.0 in | Wt 387.5 lb

## 2021-10-24 DIAGNOSIS — M545 Low back pain, unspecified: Secondary | ICD-10-CM | POA: Diagnosis not present

## 2021-10-24 DIAGNOSIS — R030 Elevated blood-pressure reading, without diagnosis of hypertension: Secondary | ICD-10-CM | POA: Diagnosis not present

## 2021-10-24 MED ORDER — MELOXICAM 15 MG PO TABS: 15.0000 mg | ORAL_TABLET | Freq: Every day | ORAL | 0 refills | Status: DC

## 2021-10-24 MED ORDER — METHYLPREDNISOLONE ACETATE 80 MG/ML IJ SUSP
80.0000 mg | Freq: Once | INTRAMUSCULAR | Status: AC
  Administered 2021-10-24: 80 mg via INTRAMUSCULAR

## 2021-10-24 MED ORDER — TIZANIDINE HCL 4 MG PO TABS: 4.0000 mg | ORAL_TABLET | Freq: Four times a day (QID) | ORAL | 0 refills | Status: DC | PRN

## 2021-10-24 NOTE — Progress Notes (Signed)
Musculoskeletal Exam  Patient: Travis [name] DOB: [birth year]  DOS: 10/24/2021  SUBJECTIVE:  Chief Complaint:   Chief Complaint  Patient presents with   Back Pain   Abdominal Pain    SEAVER MACHIA is a 36 y.o.  male for evaluation and treatment of back pain.   Onset:  4 days ago.  Stumbled on the sand and felt his back tighten.  Location: lower b/l; radiating to groin Character:  aching and sharp  Progression of issue:  is unchanged Associated symptoms: no bruising, swelling, redness Denies bowel/bladder incontinence or weakness Treatment: to date has been OTC NSAIDS and tramadol.   Neurovascular symptoms: no  Past Medical History:  Diagnosis Date   ADD (attention deficit disorder)    Asthma    childhood   Asthma    Chest pain at rest 05/17/2020   Class 3 severe obesity with serious comorbidity and body mass index (BMI) of 45.0 to 49.9 in adult [facility]) 07/28/2019   Colon polyps    Essential hypertension 03/20/2020   Hip pain    HTN (hypertension)    Insulin resistance 05/25/2020   Intermittent palpitations 05/17/2020   Joint pain    Left hip pain 07/28/2019   Morbid obesity ([place]) 05/17/2020   Near syncope 05/17/2020   Other insomnia 04/03/2020   Overweight    Restless legs syndrome (RLS) 05/17/2020   Snoring 05/17/2020   Vitamin D deficiency 03/20/2020    Objective:  VITAL SIGNS: BP (!) 142/84    Pulse 76    Temp 98.2 F (36.8 C) (Oral)    Ht 6\' 5"  (1.956 m)    Wt (!) 387 lb 8 oz (175.8 kg)    SpO2 99%    BMI 45.95 kg/m  Constitutional: Well formed, well developed. No acute distress. HENT: Normocephalic, atraumatic.  Thorax & Lungs:  No accessory muscle use Musculoskeletal: low back.   Tenderness to palpation: +TTP over lumbar parasp msc b/l, worse on L Deformity: no Ecchymosis: no Straight leg test: negative for Poor hamstring flexibility b/l. Neurologic: Normal sensory function. No focal deficits noted. DTR's equal and symmetric in LE's. No clonus. Psychiatric:  Normal mood. Age appropriate judgment and insight. Alert & oriented x 3.    Assessment:  Acute bilateral low back pain without sciatica - Plan: tiZANidine (ZANAFLEX) 4 MG tablet, meloxicam (MOBIC) 15 MG tablet  Elevated blood pressure reading  Morbid obesity ([place]), Chronic  Plan: Stretches/exercises, heat, ice, Tylenol, NSAIDs starting tomorrow, msc relaxer. Depo injection today. PT if no better.  Monitor BP at home. If not improved over next mo, he will follow up.  F/u prn. The patient voiced understanding and agreement to the plan.   [place], DO 10/24/21  10:34 AM

## 2021-10-24 NOTE — Patient Instructions (Signed)
Ice/cold pack over area for 10-15 min twice daily.  Heat (pad or rice pillow in microwave) over affected area, 10-15 minutes twice daily.   OK to take Tylenol 1000 mg (2 extra strength tabs) or 975 mg (3 regular strength tabs) every 6 hours as needed.  Let us know if you need anything.  EXERCISES  RANGE OF MOTION (ROM) AND STRETCHING EXERCISES - Low Back Pain Most people with lower back pain will find that their symptoms get worse with excessive bending forward (flexion) or arching at the lower back (extension). The exercises that will help resolve your symptoms will focus on the opposite motion.  If you have pain, numbness or tingling which travels down into your buttocks, leg or foot, the goal of the therapy is for these symptoms to move closer to your back and eventually resolve. Sometimes, these leg symptoms will get better, but your lower back pain may worsen. This is often an indication of progress in your rehabilitation. Be very alert to any changes in your symptoms and the activities in which you participated in the 24 hours prior to the change. Sharing this information with your caregiver will allow him or her to most efficiently treat your condition. These exercises may help you when beginning to rehabilitate your injury. Your symptoms may resolve with or without further involvement from your physician, physical therapist or athletic trainer. While completing these exercises, remember:  Restoring tissue flexibility helps normal motion to return to the joints. This allows healthier, less painful movement and activity. An effective stretch should be held for at least 30 seconds. A stretch should never be painful. You should only feel a gentle lengthening or release in the stretched tissue. FLEXION RANGE OF MOTION AND STRETCHING EXERCISES:  STRETCH - Flexion, Single Knee to Chest  Lie on a firm bed or floor with both legs extended in front of you. Keeping one leg in contact with the floor,  bring your opposite knee to your chest. Hold your leg in place by either grabbing behind your thigh or at your knee. Pull until you feel a gentle stretch in your low back. Hold 30 seconds. Slowly release your grasp and repeat the exercise with the opposite side. Repeat 2 times. Complete this exercise 3 times per week.   STRETCH - Flexion, Double Knee to Chest Lie on a firm bed or floor with both legs extended in front of you. Keeping one leg in contact with the floor, bring your opposite knee to your chest. Tense your stomach muscles to support your back and then lift your other knee to your chest. Hold your legs in place by either grabbing behind your thighs or at your knees. Pull both knees toward your chest until you feel a gentle stretch in your low back. Hold 30 seconds. Tense your stomach muscles and slowly return one leg at a time to the floor. Repeat 2 times. Complete this exercise 3 times per week.   STRETCH - Low Trunk Rotation Lie on a firm bed or floor. Keeping your legs in front of you, bend your knees so they are both pointed toward the ceiling and your feet are flat on the floor. Extend your arms out to the side. This will stabilize your upper body by keeping your shoulders in contact with the floor. Gently and slowly drop both knees together to one side until you feel a gentle stretch in your low back. Hold for 30 seconds. Tense your stomach muscles to support your lower back  as you bring your knees back to the starting position. Repeat the exercise to the other side. Repeat 2 times. Complete this exercise at least 3 times per week.   EXTENSION RANGE OF MOTION AND FLEXIBILITY EXERCISES:  STRETCH - Extension, Prone on Elbows  Lie on your stomach on the floor, a bed will be too soft. Place your palms about shoulder width apart and at the height of your head. Place your elbows under your shoulders. If this is too painful, stack pillows under your chest. Allow your body to relax  so that your hips drop lower and make contact more completely with the floor. Hold this position for 30 seconds. Slowly return to lying flat on the floor. Repeat 2 times. Complete this exercise 3 times per week.   RANGE OF MOTION - Extension, Prone Press Ups Lie on your stomach on the floor, a bed will be too soft. Place your palms about shoulder width apart and at the height of your head. Keeping your back as relaxed as possible, slowly straighten your elbows while keeping your hips on the floor. You may adjust the placement of your hands to maximize your comfort. As you gain motion, your hands will come more underneath your shoulders. Hold this position 30 seconds. Slowly return to lying flat on the floor. Repeat 2 times. Complete this exercise 3 times per week.   RANGE OF MOTION- Quadruped, Neutral Spine  Assume a hands and knees position on a firm surface. Keep your hands under your shoulders and your knees under your hips. You may place padding under your knees for comfort. Drop your head and point your tailbone toward the ground below you. This will round out your lower back like an angry cat. Hold this position for 30 seconds. Slowly lift your head and release your tail bone so that your back sags into a large arch, like an old horse. Hold this position for 30 seconds. Repeat this until you feel limber in your low back. Now, find your "sweet spot." This will be the most comfortable position somewhere between the two previous positions. This is your neutral spine. Once you have found this position, tense your stomach muscles to support your low back. Hold this position for 30 seconds. Repeat 2 times. Complete this exercise 3 times per week.   STRENGTHENING EXERCISES - Low Back Sprain These exercises may help you when beginning to rehabilitate your injury. These exercises should be done near your "sweet spot." This is the neutral, low-back arch, somewhere between fully rounded and fully  arched, that is your least painful position. When performed in this safe range of motion, these exercises can be used for people who have either a flexion or extension based injury. These exercises may resolve your symptoms with or without further involvement from your physician, physical therapist or athletic trainer. While completing these exercises, remember:  Muscles can gain both the endurance and the strength needed for everyday activities through controlled exercises. Complete these exercises as instructed by your physician, physical therapist or athletic trainer. Increase the resistance and repetitions only as guided. You may experience muscle soreness or fatigue, but the pain or discomfort you are trying to eliminate should never worsen during these exercises. If this pain does worsen, stop and make certain you are following the directions exactly. If the pain is still present after adjustments, discontinue the exercise until you can discuss the trouble with your caregiver.  STRENGTHENING - Deep Abdominals, Pelvic Tilt  Lie on a firm   bed or floor. Keeping your legs in front of you, bend your knees so they are both pointed toward the ceiling and your feet are flat on the floor. Tense your lower abdominal muscles to press your low back into the floor. This motion will rotate your pelvis so that your tail bone is scooping upwards rather than pointing at your feet or into the floor. With a gentle tension and even breathing, hold this position for 3 seconds. Repeat 2 times. Complete this exercise 3 times per week.   STRENGTHENING - Abdominals, Crunches  Lie on a firm bed or floor. Keeping your legs in front of you, bend your knees so they are both pointed toward the ceiling and your feet are flat on the floor. Cross your arms over your chest. Slightly tip your chin down without bending your neck. Tense your abdominals and slowly lift your trunk high enough to just clear your shoulder blades.  Lifting higher can put excessive stress on the lower back and does not further strengthen your abdominal muscles. Control your return to the starting position. Repeat 2 times. Complete this exercise 3 times per week.   STRENGTHENING - Quadruped, Opposite UE/LE Lift  Assume a hands and knees position on a firm surface. Keep your hands under your shoulders and your knees under your hips. You may place padding under your knees for comfort. Find your neutral spine and gently tense your abdominal muscles so that you can maintain this position. Your shoulders and hips should form a rectangle that is parallel with the floor and is not twisted. Keeping your trunk steady, lift your right hand no higher than your shoulder and then your left leg no higher than your hip. Make sure you are not holding your breath. Hold this position for 30 seconds. Continuing to keep your abdominal muscles tense and your back steady, slowly return to your starting position. Repeat with the opposite arm and leg. Repeat 2 times. Complete this exercise 3 times per week.   STRENGTHENING - Abdominals and Quadriceps, Straight Leg Raise  Lie on a firm bed or floor with both legs extended in front of you. Keeping one leg in contact with the floor, bend the other knee so that your foot can rest flat on the floor. Find your neutral spine, and tense your abdominal muscles to maintain your spinal position throughout the exercise. Slowly lift your straight leg off the floor about 6 inches for a count of 3, making sure to not hold your breath. Still keeping your neutral spine, slowly lower your leg all the way to the floor. Repeat this exercise with each leg 2 times. Complete this exercise 3 times per week.  POSTURE AND BODY MECHANICS CONSIDERATIONS - Low Back Sprain Keeping correct posture when sitting, standing or completing your activities will reduce the stress put on different body tissues, allowing injured tissues a chance to heal  and limiting painful experiences. The following are general guidelines for improved posture.  While reading these guidelines, remember: The exercises prescribed by your provider will help you have the flexibility and strength to maintain correct postures. The correct posture provides the best environment for your joints to work. All of your joints have less wear and tear when properly supported by a spine with good posture. This means you will experience a healthier, less painful body. Correct posture must be practiced with all of your activities, especially prolonged sitting and standing. Correct posture is as important when doing repetitive low-stress activities (typing) as   it is when doing a single heavy-load activity (lifting).  RESTING POSITIONS Consider which positions are most painful for you when choosing a resting position. If you have pain with flexion-based activities (sitting, bending, stooping, squatting), choose a position that allows you to rest in a less flexed posture. You would want to avoid curling into a fetal position on your side. If your pain worsens with extension-based activities (prolonged standing, working overhead), avoid resting in an extended position such as sleeping on your stomach. Most people will find more comfort when they rest with their spine in a more neutral position, neither too rounded nor too arched. Lying on a non-sagging bed on your side with a pillow between your knees, or on your back with a pillow under your knees will often provide some relief. Keep in mind, being in any one position for a prolonged period of time, no matter how correct your posture, can still lead to stiffness.  PROPER SITTING POSTURE In order to minimize stress and discomfort on your spine, you must sit with correct posture. Sitting with good posture should be effortless for a healthy body. Returning to good posture is a gradual process. Many people can work toward this most comfortably by  using various supports until they have the flexibility and strength to maintain this posture on their own. When sitting with proper posture, your ears will fall over your shoulders and your shoulders will fall over your hips. You should use the back of the chair to support your upper back. Your lower back will be in a neutral position, just slightly arched. You may place a small pillow or folded towel at the base of your lower back for  support.  When working at a desk, create an environment that supports good, upright posture. Without extra support, muscles tire, which leads to excessive strain on joints and other tissues. Keep these recommendations in mind:  CHAIR: A chair should be able to slide under your desk when your back makes contact with the back of the chair. This allows you to work closely. The chair's height should allow your eyes to be level with the upper part of your monitor and your hands to be slightly lower than your elbows.  BODY POSITION Your feet should make contact with the floor. If this is not possible, use a foot rest. Keep your ears over your shoulders. This will reduce stress on your neck and low back.  INCORRECT SITTING POSTURES  If you are feeling tired and unable to assume a healthy sitting posture, do not slouch or slump. This puts excessive strain on your back tissues, causing more damage and pain. Healthier options include: Using more support, like a lumbar pillow. Switching tasks to something that requires you to be upright or walking. Talking a brief walk. Lying down to rest in a neutral-spine position.  PROLONGED STANDING WHILE SLIGHTLY LEANING FORWARD  When completing a task that requires you to lean forward while standing in one place for a long time, place either foot up on a stationary 2-4 inch high object to help maintain the best posture. When both feet are on the ground, the lower back tends to lose its slight inward curve. If this curve flattens (or  becomes too large), then the back and your other joints will experience too much stress, tire more quickly, and can cause pain.  CORRECT STANDING POSTURES Proper standing posture should be assumed with all daily activities, even if they only take a few moments, like   when brushing your teeth. As in sitting, your ears should fall over your shoulders and your shoulders should fall over your hips. You should keep a slight tension in your abdominal muscles to brace your spine. Your tailbone should point down to the ground, not behind your body, resulting in an over-extended swayback posture.   INCORRECT STANDING POSTURES  Common incorrect standing postures include a forward head, locked knees and/or an excessive swayback. WALKING Walk with an upright posture. Your ears, shoulders and hips should all line-up.  PROLONGED ACTIVITY IN A FLEXED POSITION When completing a task that requires you to bend forward at your waist or lean over a low surface, try to find a way to stabilize 3 out of 4 of your limbs. You can place a hand or elbow on your thigh or rest a knee on the surface you are reaching across. This will provide you more stability, so that your muscles do not tire as quickly. By keeping your knees relaxed, or slightly bent, you will also reduce stress across your lower back. CORRECT LIFTING TECHNIQUES  DO : Assume a wide stance. This will provide you more stability and the opportunity to get as close as possible to the object which you are lifting. Tense your abdominals to brace your spine. Bend at the knees and hips. Keeping your back locked in a neutral-spine position, lift using your leg muscles. Lift with your legs, keeping your back straight. Test the weight of unknown objects before attempting to lift them. Try to keep your elbows locked down at your sides in order get the best strength from your shoulders when carrying an object.   Always ask for help when lifting heavy or awkward  objects. INCORRECT LIFTING TECHNIQUES DO NOT:  Lock your knees when lifting, even if it is a small object. Bend and twist. Pivot at your feet or move your feet when needing to change directions. Assume that you can safely pick up even a paperclip without proper posture.

## 2021-10-24 NOTE — Addendum Note (Signed)
Addended by: Sharon Seller B on: 10/24/2021 10:55 AM   Modules accepted: Orders

## 2021-10-26 ENCOUNTER — Other Ambulatory Visit: Payer: Self-pay | Admitting: Family Medicine

## 2021-10-26 MED ORDER — HYDROCODONE-ACETAMINOPHEN 5-325 MG PO TABS: 1.0000 | ORAL_TABLET | Freq: Four times a day (QID) | ORAL | 0 refills | Status: DC | PRN

## 2021-10-26 NOTE — Progress Notes (Signed)
Pt not improving on original plan. Sent in short course of Norco. Warning signs and symptoms verbalized. Pt voiced understanding. No further questions.

## 2021-10-28 ENCOUNTER — Other Ambulatory Visit: Payer: Self-pay | Admitting: Family Medicine

## 2021-10-28 DIAGNOSIS — M545 Low back pain, unspecified: Secondary | ICD-10-CM

## 2021-10-30 ENCOUNTER — Other Ambulatory Visit: Payer: Self-pay

## 2021-10-30 ENCOUNTER — Ambulatory Visit (INDEPENDENT_AMBULATORY_CARE_PROVIDER_SITE_OTHER): Payer: 59 | Admitting: Licensed Clinical Social Worker

## 2021-10-30 DIAGNOSIS — F410 Panic disorder [episodic paroxysmal anxiety] without agoraphobia: Secondary | ICD-10-CM

## 2021-10-30 NOTE — Progress Notes (Signed)
THERAPIST PROGRESS NOTE   Session Time: 2:00pm - 2:50pm    Location: Patient: [facility] Therapist: [facility]   Participation Level: Active   Behavioral Response: Alert, casually dressed, neatly groomed, euthymic mood/affect    Type of Therapy: Individual Therapy   Treatment Goals addressed: Coping with anxiety and managing panic attacks; Monitoring marijuana use; Reducing anxiety related to travel    Interventions: CBT: challenging anxious thoughts     Summary: Travis [name] is a 36 year old male mixed race male that presented today for therapy appointment with diagnosis of Panic Disorder.       Suicidal/Homicidal: None; without intent or plan.    Therapist Response:  Clinician met with Travis [name] for in-person therapy appointment and assessed for safety, and sobriety.  Travis [name] presented for today's session on time and was alert, oriented x5, with no evidence or self-report of active SI/HI or A/V H.  Travis [name] denied any abuse of alcohol.  He stated "I think I smoked one time, maybe 2 hits since the last time we talked".  Clinician inquired about Travis [name] current emotional ratings, as well as any significant changes in thoughts, feelings or behavior since previous check-in.  Travis [name] reported scores of 0/10 for depression, 0/10 for anxiety, and 0/10 for anger/irritability.  Travis [name] denied any recent panic attacks or outbursts.  Travis [name] reported that a recent struggle was going on a spontaneous trip to [place] last week for vacation with his girlfriend, which led to an increase in anxiety of 3/10 most days.  Travis [name] reported that she has suggested visiting the [place] for her [place], and wants him to accompany her on this trip, so he is worried about this.  Clinician utilized handout in session today titled "Worry exploration" in order to assist Travis [name] in reducing any anxiety related to upcoming trip.  This worksheet featured a series of Socratic questions aimed at exploring  the most likely outcomes for a situation of concern, rather than focusing on the worst possible outcome (i.e. catastrophizing).  Clinician assisted Travis [name] in identifying and challenging any irrational beliefs related to this worry, in addition to utilizing problem solving approach to explore strategies which would help him accomplish goal of going on this trip with minimal distress.  Travis [name] actively participated in discussion on handout, reporting that he is worried that being out of the country in an unfamiliar place could lead him to experience heightened anxiety again, as well as reoccurrence of panic attacks.  Travis [name] reported that there is sufficient evidence to suggest he will be able to cope with this challenge successfully, such as regular practice of coping skills, increased openness and honesty towards sharing needs with support, and having time to plan ahead.  Intervention was effective, as evidenced by Travis [name] reporting that discussion on this subject reduced his anxiety and made him feel more confident that he can travel out of the country for this trip and cope appropriately with any challenges that he might face.  He stated I feel like if I can make it through [place], I can definitely make it through the [place]  Clinician suggested that Travis [name] take time to research more outside of session about safest areas of the [place] can visit, and consider visiting the [name] State Department website for travel advisories levels and recommendations.  Clinician will continue to monitor.             Plan: Meet again in 2 weeks.   Diagnosis: Panic Disorder.   Travis [name]  Travis [name], Travis [name], Travis [name] 10/30/2021

## 2021-11-02 ENCOUNTER — Other Ambulatory Visit: Payer: Self-pay | Admitting: Family Medicine

## 2021-11-02 DIAGNOSIS — M545 Low back pain, unspecified: Secondary | ICD-10-CM

## 2021-11-13 ENCOUNTER — Other Ambulatory Visit: Payer: Self-pay

## 2021-11-13 ENCOUNTER — Ambulatory Visit (INDEPENDENT_AMBULATORY_CARE_PROVIDER_SITE_OTHER): Payer: 59 | Admitting: Licensed Clinical Social Worker

## 2021-11-13 DIAGNOSIS — F410 Panic disorder [episodic paroxysmal anxiety] without agoraphobia: Secondary | ICD-10-CM

## 2021-11-13 NOTE — Progress Notes (Signed)
THERAPIST PROGRESS NOTE   Session Time: 2:14pm - 3:00pm     Location: [facility]: [facility] Therapist: [facility]   Participation Level: Active   Behavioral Response: Alert, casually dressed, neatly groomed, euthymic mood/affect    Type of Therapy: Individual Therapy   Treatment Goals addressed: Coping with anxiety and managing panic attacks; Monitoring marijuana use   Interventions: CBT, communication skills, healthy boundaries   Summary: Travis [name] is a 36 year old male mixed race male that presented today for therapy appointment with diagnosis of Panic Disorder.       Suicidal/Homicidal: None; without intent or plan.    Therapist Response:  Clinician met with Travis [name] for in-person therapy session and assessed for safety, and sobriety.  Travis [name] presented for today's appointment 14 minutes late, but contacted clinician by email ahead of time to inform of tardiness.  Travis [name] presented alert, oriented x5, with no evidence or self-report of active SI/HI or A/V H.  Travis [name] denied any abuse of alcohol or marijuana.  Clinician inquired about Travis [name]'s emotional ratings today, as well as any significant changes in thoughts, feelings or behavior since last check-in.  [name] reported scores of 0/10 for depression, 1/10 for anxiety, and 1/10 for anger/irritability.  Travis [name] denied any recent panic attacks or outbursts.  Travis [name] reported that a recent struggle was having his grandmother admitted to the hospital due to leg pain.  Travis [name] reported that a recent success was visiting his mother for her birthday, although they had a difficult conversation, and at times Travis [name] felt like she was trying to 'guilt trip' him for things outside of his control.  Clinician discussed topic of communication skills with Travis [name] today to assist.  Clinician utilized a handout with Travis [name] that detailed various communication 'traps' that Travis [name] might experience when interacting with family, and defined each one,  including common examples such as criticism, defensiveness, contempt, stonewalling, overgeneralizing, arguing, and more.  Clinician inquired about which ones Travis [name] has engaged in, or seen demonstrated by his supports, and discussed strategies for assertively addressing each one successfully to strengthen relationships, establish fair boundaries, and improve overall communication style.  Intervention was effective, as evidenced by Travis [name] actively engaging in discussion on the subject, and identifying several communication traps present in recent interactions, such as criticisms from his mother, which triggered defensiveness on his part, and could culminate in stonewalling from him if they cannot break this pattern again.  Travis [name] reported that Travis [name] will remain assertive in addressing communication problems when they arise with his mother, be firm with his boundaries, and mindful of the effect their contact has upon his overall mental health and wellbeing.  Travis [name] stated I feel like I've taken the right steps going about this. I'm glad we brushed up on communication skills and boundaries.  Travis [name] reported that today Travis [name] will focus on self-care, including meditating after our session, and then going out to dinner with his girlfriend.  Clinician will continue to monitor.             Plan: Meet again in 2 weeks.   Diagnosis: Panic Disorder.   [name], [place], LCAS 11/13/2021

## 2021-11-27 ENCOUNTER — Ambulatory Visit (INDEPENDENT_AMBULATORY_CARE_PROVIDER_SITE_OTHER): Payer: 59 | Admitting: Licensed Clinical Social Worker

## 2021-11-27 ENCOUNTER — Other Ambulatory Visit: Payer: Self-pay

## 2021-11-27 DIAGNOSIS — F410 Panic disorder [episodic paroxysmal anxiety] without agoraphobia: Secondary | ICD-10-CM

## 2021-11-27 NOTE — Progress Notes (Signed)
THERAPIST PROGRESS NOTE   Session Time: 2:00pm - 2:55pm   Location: Patient: [facility] [place] [facility] Therapist: [facility] [place] [facility]   Participation Level: Active   Behavioral Response: Alert, casually dressed, neatly groomed, euthymic mood/affect    Type of Therapy: Individual Therapy   Treatment Goals addressed: Coping with anxiety and managing panic attacks; Monitoring marijuana use  Progress Towards Goals: Progressing   Interventions: CBT, stress management    Summary: Travis [name] is a 36 year old male mixed race male that presented today for therapy appointment with diagnosis of Panic Disorder.       Suicidal/Homicidal: None; without intent or plan.    Therapist Response:  Clinician met with Travis [name] for in-person therapy appointment and assessed for safety, and sobriety.  Travis [name] presented for today's session on time and was alert, oriented x5, with no evidence or self-report of active SI/HI or A/V H.  Travis [name] denied any abuse of alcohol or marijuana.  Clinician inquired about Travis [name]'s current emotional ratings, as well as any significant changes in thoughts, feelings or behavior since previous check-in.  Travis [name] reported scores of 0/10 for depression, 2/10 for anxiety, and 0/10 for anger/irritability.  Travis [name] denied any recent panic attacks or outbursts.  Travis [name] reported that a recent success has been having more communication with his mother as he adjusts boundaries in their relationship.  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  Clinician discussed topic of stress management with Travis [name] today to assist with alleviating anxiety related to upcoming test.  Clinician utilized a handout to guide discussion which explained how not all stressors are necessarily bad, and can actually motivate [name] to make positive changes in life.  This handout explained the  difference between distress, which is stress that adversely affects [name], and eustress, which can be energizing and help [name] overcome obstacles.  Examples of common distress stressors (i.e. deadlines, financial crisis, relationships problems, injuries, work issues) and eustress stressors (i.e. exercise, meditation, achieving a goal, etc) were compared, along with examples of common physiological and psychological changes that could be observed in these scenarios to differentiate between them.  Strategies were offered to help change his mindset when faced with distress in order to successfully tackle life challenges more effectively, such as using deep breathing, meditation, exercise, journaling, positive self-talk, or speaking with a supportive peer.  Intervention was effective, as evidenced by Travis [name] actively engaging in discussion on subject, reporting that he could identify with several of the distress stressors mentioned as challenges he has faced, including having deadlines with both jobs, disagreements with family, and financial troubles, although he also identified progress he has made in coping with past challenges to improve resilience, motivation, and self-efficacy since beginning therapy.  Travis [name] was receptive to techniques offered, noting that he would plan to speak with his girlfriend tonight about anxiety related to this test, meditate before appointment tomorrow, and practice deep breathing on the flight to stay calm and succeed.  Clinician will continue to monitor.             Plan: Meet again in 2 weeks.   Diagnosis: Panic Disorder.  Collaboration of Care:   No collaboration of care required at this time.  Patient/Guardian was advised Release of Information must be obtained prior to any record release in order to collaborate their care with an outside provider. Patient/Guardian was advised if they have not already done so to contact the  registration department to sign all necessary forms in order for [name] to release information regarding their care.   Consent: Patient/Guardian gives verbal consent for treatment and assignment of benefits for services provided during this visit. Patient/Guardian expressed understanding and agreed to proceed.   [name], [place], LCAS 11/27/2021

## 2021-12-05 ENCOUNTER — Other Ambulatory Visit: Payer: Self-pay

## 2021-12-05 ENCOUNTER — Ambulatory Visit (INDEPENDENT_AMBULATORY_CARE_PROVIDER_SITE_OTHER): Payer: 59 | Admitting: Licensed Clinical Social Worker

## 2021-12-05 DIAGNOSIS — F410 Panic disorder [episodic paroxysmal anxiety] without agoraphobia: Secondary | ICD-10-CM | POA: Diagnosis not present

## 2021-12-05 NOTE — Progress Notes (Signed)
THERAPIST PROGRESS NOTE ?  ?Session Time: 3:00pm - 4:00pm   ? ?Location: ?Patient: Travis [name] ?Therapist: [facility] [place] [name] ?  ?Participation Level: Active ?  ?Behavioral Response: Alert, casually dressed, neatly groomed, anxious mood/affect  ?  ?Type of Therapy: Individual Therapy ?  ?Treatment Goals addressed: Coping with anxiety and managing panic attacks; Safety planning  ? ?Progress Towards Goals: Progressing ?  ?Interventions: CBT, CSSRS screening, safety planning  ?  ?Summary: Travis [name] is a 36 year old male mixed race male that presented today for therapy appointment with diagnosis of Panic Disorder.     ?  ?Suicidal/Homicidal: None; without intent or plan. ?   ?Therapist Response:  Clinician met with Travis [name] for in-person therapy session and assessed for safety, and sobriety.  Travis [name] presented for today's appointment on time and was alert, oriented x5, with no evidence or self-report of active SI/HI or A/V H.  Travis [name] denied any abuse of alcohol or marijuana.  Clinician inquired about Travis [name]'s emotional ratings today, as well as any significant changes in thoughts, feelings or behavior since last check-in.  Travis [name] reported scores of 3/10 for depression, 4/10 for anxiety, and 0/10 for anger/irritability.  Travis [name] denied any recent panic attacks or outbursts.  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  Travis [name] reported that a struggle was noticing increased depression and anxiety over the past week, including appearance of passive SI without intent or plan Friday and Saturday.  Travis [name] reported that he practiced his coping skills to distract himself from these thoughts, and spoke with his support system too, but eventually outreached his doctor to restart previous medication when he feared that he would experience a panic attack again.  Clinician discussed safety planning measures with Travis [name], including identification of warning signs that a crisis could  be building, healthy distraction strategies to engage in until they pass, and reliable supports he can outreach if individual coping methods prove ineffective during crisis.  Travis [name] participated in safety planning process (see suicide risk patient safety plan in chart for more detail), was able to identify distraction activities, and family and friends he could rely on for support if necessary, such as his girlfriend, and sister.  He also reported that he would call [phone], [phone], or travel to the behavioral health hospital for assessment should SI return with development of intent and/or plan.  Travis [name] reported that he informed his friend Travis [name] of recent issues, and this person removed firearms from Travis [name]'s household to reinforce safety measures.  Clinician completed a CSSRS screening with Travis [name] as well, which confirmed that he is at no risk of self-harm at this time.  Clinician also encouraged Travis [name] to consider group therapy as an option to increase available support and learn additional coping skills if symptoms do not alleviate from restarting medications.  Intervention was effective, as evidenced by Travis [name] successfully completing comprehensive safety plan, and following up with providers for assistance following recent struggles with mental health.  Travis [name] was provided with a copy of the safety plan, and took a picture of it on his phone to discuss with support system.  He also scheduled a follow up appointment for next week, and reported that he would consider engagement in groups if symptoms do not improve in 2-3 weeks.  Travis [name] stated ?I feel like I took the right steps in scheduling this appointment with you and my doctor?.  Clinician will continue to monitor.    ? ?Plan: Meet again in 2  weeks. ?  ?Diagnosis: Panic Disorder. ? ?Collaboration of Care:   No collaboration of care required at this time.  Travis [name] was encouraged to stay in touch with his prescribing doctor about medication regimen.   ?                                                 ?Patient/Guardian was advised Release of Information must be obtained prior to any record release in order to collaborate their care with an outside provider. Patient/Guardian was advised if they have not already done so to contact the registration department to sign all necessary forms in order for [name] to release information regarding their care.  ?  ?Consent: Patient/Guardian gives verbal consent for treatment and assignment of benefits for services provided during this visit. Patient/Guardian expressed understanding and agreed to proceed. ?  ?Travis Flood, LCSW, LCAS ?12/05/2021  ?

## 2021-12-12 ENCOUNTER — Ambulatory Visit (INDEPENDENT_AMBULATORY_CARE_PROVIDER_SITE_OTHER): Payer: 59 | Admitting: Licensed Clinical Social Worker

## 2021-12-12 ENCOUNTER — Other Ambulatory Visit: Payer: Self-pay

## 2021-12-12 DIAGNOSIS — F410 Panic disorder [episodic paroxysmal anxiety] without agoraphobia: Secondary | ICD-10-CM

## 2021-12-12 NOTE — Progress Notes (Signed)
THERAPIST PROGRESS NOTE ?  ?Session Time: 1:00pm - 1:51pm   ? ?Location: ?Patient: [facility] [place] [facility] ?Therapist: [facility] [place] [facility] ?  ?Participation Level: Active ?  ?Behavioral Response: Alert, casually dressed, neatly groomed, anxious mood/affect  ?  ?Type of Therapy: Individual Therapy ?  ?Treatment Goals addressed: Coping with anxiety and managing panic attacks; Medication compliance ? ?Progress Towards Goals: Progressing ?  ?Interventions: CBT: challenging anxious thoughts, and problem solving ?  ?Summary: Travis [name] is a 36 year old male mixed race male that presented today for therapy appointment with diagnosis of Panic Disorder.     ?  ?Suicidal/Homicidal: None; without intent or plan. ?   ?Therapist Response:  Clinician met with Travis [name] for in-person therapy appointment and assessed for medication compliance, safety, and sobriety.  Travis [name] presented for today's session on time and was alert, oriented x5, with no evidence or self-report of active SI/HI or A/V H.  Travis [name] reported compliance with medications from psychiatrist and denied any abuse of alcohol or marijuana.  Clinician inquired about Travis [name]'s current emotional ratings, as well as any significant changes in thoughts, feelings or behavior since previous check-in.  Travis [name] reported scores of 1/10 for depression, 3/10 for anxiety, and 0/10 for anger/irritability.  Travis [name] denied any recent panic attacks or outbursts.  Travis [name] reported that he is feeling more stable since our last session, noting that medication has helped with anxiety, in addition to practice of coping skills, and making more time for self-care activities such as hiking, spending time with his girlfriend and listening to relaxing music when stressed.  He reported that he has also cut back on work hours in order to improve work life balance.  Travis [name] reported that one primary stressor he is facing at the moment is trying to decide where he and his girlfriend will visit for her birthday,  as his travel anxiety has returned and they have been discussing options.  Clinician assisted Travis [name] in completing cost benefit analysis regarding whether or not to visit [facility] instead of the [place] based upon how this would impact his anxiety, and possibly trigger panic attacks.  Clinician also utilized handout in session today titled "Worry exploration" in order to assist Travis [name] in reducing his anxiety related to upcoming trip.  This worksheet featured a series of Socratic questions aimed at exploring the most likely outcomes for a situation of concern, rather than focusing on the worst possible outcome (i.e. catastrophizing).  Clinician assisted Travis [name] in identifying and challenging any irrational beliefs related to this worry, in addition to utilizing problem solving approach to explore strategies which would help him accomplish goal of travelling out of state without issue.  Travis [name] actively participated in discussion on handout, reporting that he is primarily worried that he will go on this trip and something about it will trigger a panic attack.  Travis [name] reported that there is little evidence that he will experience a reoccurrence due to stability seen since restarting medicine and making more effort to practice coping skills, and even if he did experience one, he will have his girlfriend for support, and have hospitals nearby for professional assistance if necessary.  Intervention was effective, as evidenced by Travis [name] reporting that discussion on this subject reduced his anxiety about upcoming trips and increased confidence that he can follow through on travel plans after all.  Travis [name] stated ?This definitely opened my mind up to thinking about my options so I can do more research and make the best choice for [name]?Marland Kitchen  Clinician will continue to monitor.    ? ?Plan: Meet again in 2 weeks. ?  ?Diagnosis: Panic Disorder. ? ?Collaboration of Care:   No collaboration of care required at this  time.  Travis [name] was encouraged to stay in touch with his prescribing doctor about medication regimen.   ?                                                ?Patient/Guardian was advised Release of Information must be obtained prior to any record release in order to collaborate their care with an outside provider. Patient/Guardian was advised if they have not already done so to contact the registration department to sign all necessary forms in order for [name] to release information regarding their care.  ?  ?Consent: Patient/Guardian gives verbal consent for treatment and assignment of benefits for services provided during this visit. Patient/Guardian expressed understanding and agreed to proceed. ?  ?[name], LCSW, LCAS ?12/12/2021  ? ? ?

## 2021-12-18 ENCOUNTER — Ambulatory Visit (INDEPENDENT_AMBULATORY_CARE_PROVIDER_SITE_OTHER): Payer: Self-pay | Admitting: Licensed Clinical Social Worker

## 2021-12-18 ENCOUNTER — Other Ambulatory Visit: Payer: Self-pay

## 2021-12-18 DIAGNOSIS — F410 Panic disorder [episodic paroxysmal anxiety] without agoraphobia: Secondary | ICD-10-CM

## 2021-12-18 NOTE — Progress Notes (Signed)
THERAPIST PROGRESS NOTE ?  ?Session Time: 3:00pm - 4:00pm   ? ?Location: ?Patient: Travis [name] ?Therapist: [facility] [place] [name] ?  ?Participation Level: Active ?  ?Behavioral Response: Alert, casually dressed, neatly groomed, anxious mood/affect   ?  ?Type of Therapy: Individual Therapy ?  ?Treatment Goals addressed: Coping with anxiety and managing panic attacks; Medication compliance; Psychiatry followup ? ?Progress Towards Goals: Progressing   ?  ?Interventions: CBT: gratitude journaling    ?  ?Summary: Travis [name] is a 36 year old male mixed race male that presented today for therapy appointment with diagnosis of Panic Disorder.     ?  ?Suicidal/Homicidal: None; without intent or plan. ?   ?Therapist Response:  Clinician met with [name] for in-person therapy session and assessed for medication compliance, safety, and sobriety.  [name] presented for today's appointment on time and was alert, oriented x5, with no evidence or self-report of active SI/HI or A/V H.  Cayde reported that he continues taking medication as prescribed and denied any abuse of alcohol or marijuana.  Clinician inquired about [name]'s emotional ratings today, as well as any significant changes in thoughts, feelings or behavior since last check-in.  [name] reported scores of 1/10 for depression, 2/10 for anxiety, and 0/10 for anger/irritability.  [name] denied any recent panic attacks or outbursts.  Cadence reported that a recent success was meeting with his psychiatrist today, stating "I told him about my concerns for the upcoming trip and he upped my Venlafaxine to 144m".  AJohnmarkreported that he experienced some doubts over the weekend about going [place] and voiced these to her, who was empathetic, and let him know that she would still follow through with plans whether he goes or not.  AJakariereported that he journaled about his feelings on this, reviewing his coping skills  that could be used, and utilizing strategies from therapy to challenge anxious thoughts.  He stated "I am going to go, and I think it will be a fun time".  Clinician discussed topic of gratitude journaling with AJiaas a form of self-care.  Clinician shared a handout with him today which explained the benefits of this practice, including reduction in stress, increased happiness, and self-esteem.  Tips were also provided to aid in practice, such as taking time with entries, writing about people he is grateful for, and setting goal for two entries per week for at least 10-20 minutes at a time.  Clinician also provided AKaieawith a variety of journaling prompts to choose from today, and encouraged him to take time to write about something he was grateful for, with examples such as ?Something beautiful I recently saw was?.?, ?Something I can be proud of is??, ?A reason to be excited for the future is?? and more.  Clinician inquired about [name]'s entries, along with his perspective on the activity and motivation level towards making this a habit. Intervention was effective, as evidenced by AElberta Fortisparticipating in journaling activity, and choosing the prompts "Someone whose company I enjoy is" and "The best part about today was".  AMurrelexpressed gratitude for pleasant events that unfolded so far today such as getting in exercise, which led to a sense of accomplishment, having a helpful appointment with his psychiatrist, having a meal with his neighbor, relaxing with his girlfriend, and meeting with therapist to prepare for upcoming trip.  He stated "This shows me that I'm moving forward with achieving my dreams".  He also  expressed gratitude for companionship and support offered from his girlfriend as he continues working to stabilize his mental health again.  [name] reported that as he prepares for trip in upcoming week, he will continue this practice based upon how it made him feel, along with focus on other  self-care activities for healthy distraction, such as scheduling a massage, meditating, using deep breathing, fishing, playing basketball, and exercising.  Clinician will continue to monitor.    ? ?Plan: Meet again in 2 weeks. ?  ?Diagnosis: Panic Disorder. ? ?Collaboration of Care:   No collaboration of care required at this time.  [name] was encouraged to stay in touch with his prescribing doctor about medication regimen.   ?                                                ?Patient/Guardian was advised Release of Information must be obtained prior to any record release in order to collaborate their care with an outside provider. Patient/Guardian was advised if they have not already done so to contact the registration department to sign all necessary forms in order for [name] to release information regarding their care.  ?  ?Consent: Patient/Guardian gives verbal consent for treatment and assignment of benefits for services provided during this visit. Patient/Guardian expressed understanding and agreed to proceed. ?  ?[name], LCSW, LCAS ?12/18/2021   ?

## 2022-01-08 ENCOUNTER — Ambulatory Visit (INDEPENDENT_AMBULATORY_CARE_PROVIDER_SITE_OTHER): Payer: 59 | Admitting: Licensed Clinical Social Worker

## 2022-01-08 DIAGNOSIS — F410 Panic disorder [episodic paroxysmal anxiety] without agoraphobia: Secondary | ICD-10-CM | POA: Diagnosis not present

## 2022-01-08 NOTE — Progress Notes (Signed)
Virtual Visit via Video Note ?  ?I connected with Travis [name] on 01/08/22 at 2pm by video enabled telemedicine application and verified that I am speaking with the correct person using two identifiers. ?  ?I discussed the limitations, risks, security and privacy concerns of performing an evaluation and management service by video and the availability of in person appointments. I also discussed with the patient that there may be a patient responsible charge related to this service. The patient expressed understanding and agreed to proceed. ?  ?I discussed the assessment and treatment plan with the patient. The patient was provided an opportunity to ask questions and all were answered. The patient agreed with the plan and demonstrated an understanding of the instructions. ?  ?The patient was advised to call back or seek an in-person evaluation if the symptoms worsen or if the condition fails to improve as anticipated. ?  ?I provided 1 hour of non-face-to-face time during this encounter. ?  ?  ?Travis Flood, LCSW, LCAS ?________________________________ ?THERAPIST PROGRESS NOTE ?  ?Session Time: 2:00pm - 3:00pm   ? ?Location: ?Patient: Patient Home ?Therapist: Clinician Home Office ?  ?Participation Level: Active ?  ?Behavioral Response: Alert, casually dressed, neatly groomed, euthymic mood/affect   ?  ?Type of Therapy: Individual Therapy ?  ?Treatment Goals addressed: Coping with anxiety and managing panic attacks; Medication compliance ? ?Progress Towards Goals: Progressing   ?  ?Interventions: CBT, psychoeducation on healthy boundaries  ?  ?Summary: Travis [name] is a 36 year old male mixed race male that presented today for therapy appointment with diagnosis of Panic Disorder.     ?  ?Suicidal/Homicidal: None; without intent or plan. ?   ?Therapist Response:  Clinician met with Travis [name] for virtual therapy appointment and assessed for medication compliance, safety, and sobriety.  Travis [name] presented for today's  session on time and was alert, oriented x5, with no evidence or self-report of active SI/HI or A/V H.  Travis [name] reported ongoing compliance with medication and denied any abuse of alcohol or marijuana.  Clinician inquired about Travis [name]'s current emotional ratings, as well as any significant changes in thoughts, feelings or behavior since previous check-in.  Travis [name] reported scores of 0/10 for depression, 1/10 for anxiety, and 0/10 for anger/irritability.  Travis [name] denied any recent panic attacks or outbursts.  Travis [name] reported that a recent success was successfully going [place] with his girlfriend and having an enjoyable time despite initial anxiety about travel.  Travis [name] stated ?It was one of the best trips of my life.  I think having everything set up and scheduled ahead of time made a big difference?Marland Kitchen  Travis [name] reported that one struggle was having a conversation with his girlfriend present around friends recently on the subject of having kids together, which did not go well.  Travis [name] reported that this made him realize that he and his partner need to work more on boundaries in the relationship.  Clinician revisited topic of boundaries with Travis [name] today to assist.  Clinician utilized a handout on the subject which featured various categories present within typical relationship, including physical, intellectual, emotional, sexual, material, and time.  Healthy versus unhealthy traits were presented for each category to gauge quality of boundaries within the relationship (i.e. respecting a partner's unique thoughts and ideas in regard to healthy intellectual boundaries instead of dismissing or belittling one's beliefs).  Clinician inquired about areas of concern within the present relationship's boundaries, and discussed strategies for assertively communicating these issues to seek resolution.  Intervention was effective, as evidenced by Travis [name] participating in discussion on the subject, reporting  that there are several areas he and his partner could work on moving forward to improve the relationship, including clarifying if and when they would like to have children, get married and move into a shared home.  Travis [name] reported that he is motivated to journal about this more in free time, offer his partner the chance to sit down and listen to her perspective on these areas to increase understanding and reduce conflict, stating ?I took a lot of notes so I can revisit the conversation with her, about a house, kids, marriage, and what we both expect.  This helps me move the conversation forward for sure?.  Clinician will continue to monitor.    ? ?Plan: Meet again in 2 weeks. ?  ?Diagnosis: Panic Disorder. ? ?Collaboration of Care:   No collaboration of care required at this time.  Travis [name] was encouraged to stay in touch with his prescribing doctor about medication regimen.   ?                                                ?Patient/Guardian was advised Release of Information must be obtained prior to any record release in order to collaborate their care with an outside provider. Patient/Guardian was advised if they have not already done so to contact the registration department to sign all necessary forms in order for [name] to release information regarding their care.  ?  ?Consent: Patient/Guardian gives verbal consent for treatment and assignment of benefits for services provided during this visit. Patient/Guardian expressed understanding and agreed to proceed. ?  ?Travis Flood, LCSW, LCAS ?01/08/2022   ? ?

## 2022-01-29 ENCOUNTER — Ambulatory Visit (INDEPENDENT_AMBULATORY_CARE_PROVIDER_SITE_OTHER): Payer: 59 | Admitting: Licensed Clinical Social Worker

## 2022-01-29 DIAGNOSIS — F410 Panic disorder [episodic paroxysmal anxiety] without agoraphobia: Secondary | ICD-10-CM
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
## 2022-02-08 ENCOUNTER — Encounter: Payer: Self-pay | Admitting: Family Medicine

## 2022-02-08 ENCOUNTER — Ambulatory Visit (INDEPENDENT_AMBULATORY_CARE_PROVIDER_SITE_OTHER): Payer: 59 | Admitting: Family Medicine

## 2022-02-08 VITALS — BP 112/70 | HR 67 | Temp 98.0°F | Ht 76.0 in | Wt 387.4 lb

## 2022-02-08 DIAGNOSIS — Z0289 Encounter for other administrative examinations: Secondary | ICD-10-CM

## 2022-02-08 NOTE — Progress Notes (Signed)
Chief Complaint  ?Patient presents with  ? complete paperwork  ? ? ?Subjective: ?Patient is a 36 y.o. male here for encounter to complete a form. ? 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  He has been following for several years now and has had no issues.  He sees psychiatry for generalized anxiety for which she takes medication for.  He has been stable over the past 2 years and has no adverse effects with this medication.  He reports compliance.  He has no lingering musculoskeletal injuries or neurological disease. ? ?Past Medical History:  ?Diagnosis Date  ? ADD (attention deficit disorder)   ? Asthma   ? childhood  ? Asthma   ? Chest pain at rest 05/17/2020  ? Class 3 severe obesity with serious comorbidity and body mass index (BMI) of 45.0 to 49.9 in adult [facility]) 07/28/2019  ? Colon polyps   ? Essential hypertension 03/20/2020  ? Hip pain   ? HTN (hypertension)   ? Insulin resistance 05/25/2020  ? Intermittent palpitations 05/17/2020  ? Joint pain   ? Left hip pain 07/28/2019  ? Morbid obesity ([place]) 05/17/2020  ? Near syncope 05/17/2020  ? Other insomnia 04/03/2020  ? Overweight   ? Restless legs syndrome (RLS) 05/17/2020  ? Snoring 05/17/2020  ? Vitamin D deficiency 03/20/2020  ? ? ?Objective: ?BP 112/70   Pulse 67   Temp 98 ?F (36.7 ?C) (Oral)   Ht '6\' 4"'$  (1.93 m)   Wt (!) 387 lb 6 oz (175.7 kg)   SpO2 99%   BMI 47.15 kg/m?  ?General: Awake, appears stated age ?HEENT: Ears patent without drainage/erythema or TM abnormalities, nares are patent without discharge, PERRLA, EOMI, MMM ?Neck: Supple, symmetric, no thyromegaly ?Neuro: Gait is normal, DTRs equal and symmetric throughout, no clonus, no cerebellar signs; 5/5 strength throughout ?MSK: No muscle group atrophy or asymmetry ?GU: No hernia, no external genital lesions ?Rectal: No external hemorrhoids or lesions appreciated ?Skin: Various body art noted, no other lesions on exposed skin surface ?Heart: RRR, no LE edema ?Lungs: CTAB, no rales,  wheezes or rhonchi. No accessory muscle use ?Psych: Age appropriate judgment and insight, normal affect and mood ? ?Assessment and Plan: ?Encounter for completion of form with patient ? ?Form filled out for FAA requirements.  He has been doing and has no new medical concerns.  He is stable on his current medical regimen with the psychiatry team.  He will follow-up as originally scheduled. ?The patient voiced understanding and agreement to the plan. ? ?I spent 20 minutes with the patient discussing the above issues in addition to reviewing his chart and filling out his form on the same day of the visit. ? ?Shelda Pal, DO ?02/08/22  ?1:17 PM ? ? ? ? ?

## 2022-02-08 NOTE — Patient Instructions (Signed)
Keep the diet clean and stay active.  Let us know if you need anything. 

## 2022-02-15 ENCOUNTER — Other Ambulatory Visit: Payer: Self-pay | Admitting: Family Medicine

## 2022-02-15 MED ORDER — PROMETHAZINE-DM 6.25-15 MG/5ML PO SYRP: 5.0000 mL | ORAL_SOLUTION | Freq: Four times a day (QID) | ORAL | 0 refills | Status: DC | PRN

## 2022-02-15 MED ORDER — AZITHROMYCIN 250 MG PO TABS: ORAL_TABLET | ORAL | 0 refills | Status: DC

## 2022-02-15 MED ORDER — PREDNISONE 20 MG PO TABS: 40.0000 mg | ORAL_TABLET | Freq: Every day | ORAL | 0 refills | Status: AC

## 2022-02-19 ENCOUNTER — Ambulatory Visit (INDEPENDENT_AMBULATORY_CARE_PROVIDER_SITE_OTHER): Payer: 59 | Admitting: Licensed Clinical Social Worker

## 2022-02-19 DIAGNOSIS — F410 Panic disorder [episodic paroxysmal anxiety] without agoraphobia: Secondary | ICD-10-CM

## 2022-02-19 NOTE — Progress Notes (Signed)
Virtual Visit via Video Note ?  ?I connected with Tobey Grim on 02/19/22 at 2:00pm by Video Enabled Telemedicine Application and verified that I am speaking with the correct person using two identifiers. ?  ?I discussed the limitations, risks, security and privacy concerns of performing an evaluation and management service by video and the availability of in person appointments. I also discussed with the patient that there may be a patient responsible charge related to this service. The patient expressed understanding and agreed to proceed. ?  ?I discussed the assessment and treatment plan with the patient. The patient was provided an opportunity to ask questions and all were answered. The patient agreed with the plan and demonstrated an understanding of the instructions. ?  ?The patient was advised to call back or seek an in-person evaluation if the symptoms worsen or if the condition fails to improve as anticipated. ?  ?I provided 50 minutes of non-face-to-face time during this encounter. ?  ?  ?[name], LCSW, LCAS ?_______________________________ ?THERAPIST PROGRESS NOTE ?  ?Session Time: 2:00pm - 2:50pm  ? ?Location: ?Patient: Patient Home ?Therapist: [facility] [place] [facility] ?  ?Participation Level: Active ?  ?Behavioral Response: Alert, casually dressed, neatly groomed, euthymic mood/affect  ?  ?Type of Therapy: Individual Therapy ?  ?Treatment Goals addressed: Coping with anxiety and managing panic attacks; Medication compliance; Improving communication skills and boundaries  ? ?Progress Towards Goals: Progressing  ?  ?Interventions: CBT, communication skills  ?  ?Summary: Titan Karner is a 36 year old male mixed race male that presented today for therapy appointment with diagnosis of Panic Disorder.     ?  ?Suicidal/Homicidal: None; without intent or plan. ?   ?Therapist Response:  Clinician met with [name] for virtual therapy appointment and assessed for medication compliance, safety, and sobriety.  Khaleem  presented for today's session on time and was alert, oriented x5, with no evidence or self-report of active SI/HI or A/V H.  Tiberius reported ongoing compliance with medication and denied any abuse of alcohol or illicit substances.  Clinician inquired about Willard's current emotional ratings, as well as any significant changes in thoughts, feelings or behavior since previous check-in.  Romon reported scores of 0/10 for depression, 0/10 for anxiety, and 0/10 for anger/irritability.  Rochester denied any recent panic attacks or outbursts.  Otis reported that a recent struggle was being diagnosed with the flu, which is why he requested a virtual session today instead of in-person.  He reported that his girlfriend's father also passed away, so he has been trying to provide her with comfort during the grieving process. Lemoine reported that at times there can be tension, and he is not sure how to address this while avoiding conflict.  Clinician reviewed material with [name] today on communication skills which could be utilized to increase understanding and support within the relationship during this difficult transition.  Clinician presented a handout on 'soft startups' which offered suggestions on how Quinterrius could address the current problem assertively with his partner, including tips such as choosing an appropriate time/setting, being mindful of maintaining gentle tone, volume and language, while avoiding triggering nonverbals such as rolling eyes, as well as utilizing ?I? statements to express feelings, focusing on one problem at a time, and being respectful.  Intervention was effective, as evidenced by [name] participating in conversation, identifying communication traps to be more mindful of, and showing receptiveness to suggestions offered to improve conflict resolution, stating "This made me realize I've just been avoiding conflict  with her, like I'm walking on eggshells.  I'm trying to be a good partner  and support her, but there is a fine line there where it starts to affect me emotionally".  Clinician and Raheem weighed pros and cons of whether it would be helpful to pursue couples counseling with his partner to resolve ongoing communication patterns.  Clinician also provided referral for [facility] grief counseling if his partner is receptive to this, and will continue to monitor.    ? ?Plan: Meet again in 2 weeks. ?  ?Diagnosis: Panic Disorder. ? ?Collaboration of Care:   No collaboration of care required at this time.  Dresden was encouraged to stay in touch with his prescribing doctor about medication regimen.   ?                                                ?Patient/Guardian was advised Release of Information must be obtained prior to any record release in order to collaborate their care with an outside provider. Patient/Guardian was advised if they have not already done so to contact the registration department to sign all necessary forms in order for [name] to release information regarding their care.  ?  ?Consent: Patient/Guardian gives verbal consent for treatment and assignment of benefits for services provided during this visit. Patient/Guardian expressed understanding and agreed to proceed. ?  ?[name], LCSW, LCAS ?02/19/2022   ?

## 2022-03-12 ENCOUNTER — Ambulatory Visit (INDEPENDENT_AMBULATORY_CARE_PROVIDER_SITE_OTHER): Payer: 59 | Admitting: Licensed Clinical Social Worker

## 2022-03-12 DIAGNOSIS — F410 Panic disorder [episodic paroxysmal anxiety] without agoraphobia: Secondary | ICD-10-CM

## 2022-03-12 DIAGNOSIS — R69 Illness, unspecified: Secondary | ICD-10-CM | POA: Diagnosis not present

## 2022-03-12 NOTE — Progress Notes (Signed)
THERAPIST PROGRESS NOTE   Session Time: 2:00pm - 3:00pm   Location: Patient: [facility] [place] [facility] Therapist: [facility] [place] [facility]   Participation Level: Active   Behavioral Response: Alert, casually dressed, neatly groomed, euthymic mood/affect    Type of Therapy: Individual Therapy   Treatment Goals addressed: Coping with anxiety and managing panic attacks; Medication compliance; Improving communication skills and boundaries; Monitoring substance use   Progress Towards Goals: Progressing    Interventions: CBT, relapse prevention planning    Summary: Travis [name] is a 36 year old male mixed race male that presented today for therapy appointment with diagnosis of Panic Disorder.       Suicidal/Homicidal: None; without intent or plan.    Therapist Response:  Clinician met with Travis [name] for in-person therapy session and assessed for medication compliance, safety, and sobriety.  Travis [name] presented for today's appointment on time and was alert, oriented x5, with no evidence or self-report of active SI/HI or A/V H.  Travis [name] reported ongoing compliance with medication and denied any abuse of alcohol or illicit substances.  Clinician inquired about Travis [name]'s emotional ratings today, as well as any significant changes in thoughts, feelings or behavior since last check-in.  Travis [name] reported scores of 0/10 for depression, 2/10 for anxiety, and 0/10 for anger/irritability.  Travis [name] denied any recent panic attacks or outbursts.  Travis [name] reported that a recent success has been noticing improvement in communication with his girlfriend, stating "Things are going pretty good and we talked about some things from our last session so we can avoid communication traps a little better".  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  Travis [name] reported that one topic he wished to focus on today was marijuana use, as he has abstained  from 1 month now, but still has the urge to use at times, and wished to gain insight into this.  Clinician utilized a handout on topic of relapse prevention planning to assist Travis [name] in building insight into this habit.  This handout explained how external triggers (I.e. people, places and things) can influence the likelihood of someone using a particular substance, and Travis [name] was tasked with going through this questionnaire to identify which specific ones he could relate to, with examples such as being at a friend's house, a concert, having extra money on hand, etc.  Travis [name] was also tasked with ranking his particular triggers on a scale from 0-100%, with 0 indicating no chance of using, and 100 being absolute certainty of using this substance.  Clinician also discussed new and old strategies for preventing relapse on this substance, such as urge surfing, 'playing the tape forward' to think of consequences of use, and keeping an accountability support around to intervene if tempted to use.  Intervention was effective, as evidenced by Travis [name] actively engaging in activity on handout, and successfully identifying numerous triggers, such as being at parties, concerts, vacations, or other celebratory events in presence of people that commonly smoke marijuana, and being in a generally good mood.  Travis [name] stated "I guess I'm the party boy type, and when I'm having a good time I want to enhance that even further". Travis [name] reported that he will be more mindful of limiting exposure to these triggers when possible to aid in continued abstinence, and explore healthier strategies for coping, including taking his girlfriend with him to these particular situations for increased accountability, or practicing urge surfing.  Travis [name] stated "I  don't want to go back to where I was, feeling dependent on weed and using it every day".  Clinician will continue to monitor.     Plan: Meet again in 2 weeks.   Diagnosis: Panic  Disorder.  Collaboration of Care:   No collaboration of care required at this time.  Travis [name] was encouraged to stay in touch with his prescribing doctor about medication regimen.                                                   Patient/Guardian was advised Release of Information must be obtained prior to any record release in order to collaborate their care with an outside provider. Patient/Guardian was advised if they have not already done so to contact the registration department to sign all necessary forms in order for [name] to release information regarding their care.    Consent: Patient/Guardian gives verbal consent for treatment and assignment of benefits for services provided during this visit. Patient/Guardian expressed understanding and agreed to proceed.   [name], LCSW, LCAS 03/12/2022

## 2022-03-14 DIAGNOSIS — E559 Vitamin D deficiency, unspecified: Secondary | ICD-10-CM | POA: Diagnosis not present

## 2022-03-14 DIAGNOSIS — Z86010 Personal history of colonic polyps: Secondary | ICD-10-CM | POA: Diagnosis not present

## 2022-03-14 DIAGNOSIS — Z6841 Body Mass Index (BMI) 40.0 and over, adult: Secondary | ICD-10-CM | POA: Diagnosis not present

## 2022-03-14 DIAGNOSIS — R69 Illness, unspecified: Secondary | ICD-10-CM | POA: Diagnosis not present

## 2022-03-14 DIAGNOSIS — K760 Fatty (change of) liver, not elsewhere classified: Secondary | ICD-10-CM | POA: Diagnosis not present

## 2022-03-14 DIAGNOSIS — R1013 Epigastric pain: Secondary | ICD-10-CM | POA: Diagnosis not present

## 2022-03-18 ENCOUNTER — Other Ambulatory Visit: Payer: Self-pay | Admitting: General Surgery

## 2022-03-18 ENCOUNTER — Other Ambulatory Visit (HOSPITAL_COMMUNITY): Payer: Self-pay | Admitting: General Surgery

## 2022-03-27 ENCOUNTER — Ambulatory Visit (HOSPITAL_COMMUNITY)
Admission: RE | Admit: 2022-03-27 | Discharge: 2022-03-27 | Disposition: A | Discharge status: Home / Self Care | Payer: 59 | Source: Ambulatory Visit | Attending: General Surgery | Admitting: General Surgery

## 2022-03-27 DIAGNOSIS — Z6841 Body Mass Index (BMI) 40.0 and over, adult: Secondary | ICD-10-CM | POA: Insufficient documentation

## 2022-04-02 ENCOUNTER — Ambulatory Visit (INDEPENDENT_AMBULATORY_CARE_PROVIDER_SITE_OTHER): Payer: 59 | Admitting: Licensed Clinical Social Worker

## 2022-04-02 DIAGNOSIS — R69 Illness, unspecified: Secondary | ICD-10-CM | POA: Diagnosis not present

## 2022-04-02 DIAGNOSIS — F410 Panic disorder [episodic paroxysmal anxiety] without agoraphobia: Secondary | ICD-10-CM

## 2022-04-11 ENCOUNTER — Encounter: Payer: Self-pay | Admitting: Dietician

## 2022-04-11 ENCOUNTER — Encounter: Payer: 59 | Attending: Family Medicine | Admitting: Dietician

## 2022-04-11 DIAGNOSIS — E669 Obesity, unspecified: Secondary | ICD-10-CM | POA: Diagnosis not present

## 2022-04-11 DIAGNOSIS — Z6841 Body Mass Index (BMI) 40.0 and over, adult: Secondary | ICD-10-CM | POA: Diagnosis not present

## 2022-04-11 DIAGNOSIS — Z713 Dietary counseling and surveillance: Secondary | ICD-10-CM | POA: Diagnosis not present

## 2022-04-11 NOTE — Progress Notes (Addendum)
Nutrition Assessment for Bariatric Surgery Medical Nutrition Therapy Appt Start Time: 2:07    End Time: 3:09  Patient was seen on 04/11/2022 for Pre-Operative Nutrition Assessment. Letter of approval faxed to [facility] Surgery bariatric surgery program coordinator on 04/11/2022.   Referral stated Supervised Weight Loss (SWL) visits needed: 12  Planned surgery: Sleeve Gastrectomy Pt expectation of surgery: Be healthy, around 250 lbs.    NUTRITION ASSESSMENT   Anthropometrics  Start weight at [facility]: 387.1 lbs (date: 04/11/2022)  Height: 77 in BMI: 45.90 kg/m2     Clinical  Medical hx: Obesity, Asthma, HTN Medications: Xyzal, Clonazepam, Venlafaxine  Labs: 08/06/2021: Chol 208; LDL 133;  Notable signs/symptoms: none noted Any previous deficiencies? No  Micronutrient Nutrition Focused Physical Exam: Hair: No issues observed Eyes: No issues observed Mouth: No issues observed Neck: No issues observed Nails: No issues observed Skin: No issues observed  Lifestyle & Dietary Hx  Pt states he lives alone. Pt performs the food shopping and prepares the meals. He reports that he typically skips or misses 4 out of 21 possible meals per week. He may have 9 meals per week that are take-out or at a restaurant.  Pt states he has some stomach pain occationally, stating red wine, red meat, and ketchup bother him.  States he has been dealing with that for the past two years.  Pt states no diagnosis yet.  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 He denies binge eating though has felt shame and/or guilt after eating too much food.  He denies having used laxatives or vomiting to facilitate weight loss. He denies emotional eating during times of stress. He states that he knows the difference between hunger and thirst and can tell when he is full. Pt states he is not completely decided to have the surgery.  Pt states he is trying to make changes without surgery. Pt state portion control can be  an issue for him.  Pt states lately he has been able to stop, and save food for later. Pt states he is currently using intermittent fasting for weight loss, stating he is doing a 18/6, usually eating from 3-8 pm Pt states he did a water fast for 72 hours about 2 weeks ago.  24-Hr Dietary Recall First Meal: 2 eggs, 2-3 pieces of sausage or bacon, pancake, or waffle Snack:  Second Meal: Subway or pizza or Asian Snack: Cracker with fruit, or left overs from the night before Third Meal: Chicken or Fish or Tofu with vegetable and starch Snack:  Beverages: water, Gatorade Zero    NUTRITION DIAGNOSIS  Overweight/obesity ([place]-3.3) related to past poor dietary habits and physical inactivity as evidenced by patient w/ planned Sleeve Gastrectomy surgery following dietary guidelines for continued weight loss.    NUTRITION INTERVENTION  Nutrition counseling (C-1) and education (E-2) to facilitate bariatric surgery goals.  Educated pt on micronutrient deficiencies post surgery and strategies to mitigate that risk   Pre-Op Goals Reviewed with the Patient Track food and beverage intake (pen and paper, MyFitness Pal, Baritastic app, etc.) Make healthy food choices while monitoring portion sizes Consume 3 meals per day or try to eat every 3-5 hours Avoid concentrated sugars and fried foods Keep sugar & fat in the single digits per serving on food labels Practice CHEWING your food (aim for applesauce consistency) Practice not drinking 15 minutes before, during, and 30 minutes after each meal and snack Avoid all carbonated beverages (ex: soda, sparkling beverages)  Limit caffeinated beverages (ex: coffee, tea,  energy drinks) Avoid all sugar-sweetened beverages (ex: regular soda, sports drinks)  Avoid alcohol  Aim for 64-100 ounces of FLUID daily (with at least half of fluid intake being plain water)  Aim for at least 60-80 grams of PROTEIN daily Look for a liquid protein source that contains ?15 g  protein and ?5 g carbohydrate (ex: shakes, drinks, shots) Make a list of non-food related activities Physical activity is an important part of a healthy lifestyle so keep it moving! The goal is to reach 150 minutes of exercise per week, including cardiovascular and weight baring activity.  *Goals that are bolded indicate the pt would like to start working towards these  Handouts Provided Include  Bariatric Surgery handouts (Nutrition Visits, Pre-Op Goals, Protein Shakes, Vitamins & Minerals)  Learning Style & Readiness for Change Teaching method utilized: Visual & Auditory  Demonstrated degree of understanding via: Teach Back  Readiness Level: contemplative Barriers to learning/adherence to lifestyle change: states he is not sure that he wants the surgery  RD's Notes for Next Visit Progress toward patient's chosen goals     MONITORING & EVALUATION Dietary intake, weekly physical activity, body weight, and pre-op goals reached at next nutrition visit.    Next Steps  Patient is to follow up at [place] for SWL visit.

## 2022-04-24 ENCOUNTER — Ambulatory Visit (INDEPENDENT_AMBULATORY_CARE_PROVIDER_SITE_OTHER): Payer: 59 | Admitting: Licensed Clinical Social Worker

## 2022-04-24 DIAGNOSIS — R69 Illness, unspecified: Secondary | ICD-10-CM | POA: Diagnosis not present

## 2022-04-24 DIAGNOSIS — F410 Panic disorder [episodic paroxysmal anxiety] without agoraphobia: Secondary | ICD-10-CM

## 2022-04-24 NOTE — Progress Notes (Signed)
THERAPIST PROGRESS NOTE   Session Time: 2:20pm - 3:00pm   Location: Patient: [facility] [place] [facility] Therapist: [facility] [place] [facility]   Participation Level: Active   Behavioral Response: Alert, casually dressed, neatly groomed, euthymic mood/affect   Type of Therapy: Individual Therapy   Treatment Goals addressed: Coping with anxiety and managing panic attacks; Medication compliance; Improving communication skills and boundaries; Monitoring substance use  Progress Towards Goals: Progressing   Interventions: CBT, problem solving    Summary: Travis [name] is a 36 year old male mixed race male that presented today for therapy appointment with diagnosis of Panic Disorder.       Suicidal/Homicidal: None; without intent or plan.    Therapist Response:  Clinician met with Travis [name] for in-person therapy session and assessed for medication compliance, safety, and sobriety.  Travis [name] presented for today's appointment 20 minutes late but contacted office ahead of time to inform of anticipated tardiness.  He was alert, oriented x5, with no evidence or self-report of active SI/HI or A/V H.  Travis [name] reported ongoing compliance with medication and denied any abuse of alcohol.  He did disclose use of marijuana on his birthday, stating "It just made me a little tired".  Clinician inquired about Travis [name]'s emotional ratings today, as well as any significant changes in thoughts, feelings or behavior since last check-in.  Travis [name] reported scores of 0/10 for depression, 0/10 for anxiety, and 1/10 for anger/irritability.  Travis [name] denied any recent panic attacks or outbursts.  Travis [name] reported that a recent success was travelling out of town for his birthday, stating "We went to [place] and had a good time.  I'm going to [place] soon too".  Travis [name] reported that one struggle has been having more frequent conversations with his girlfriend about the next steps in their relationship, including moving in together, marriage and  children. Clinician assisted Travis [name] in running cost benefit analysis regarding whether or not he is ready for these steps to aid in decision making process and alleviate anxiety.  Clinician also discussed strategies for ensuring adequate, balanced self-care in the relationship following transition to living together, and suggested exploring pre-marital counseling as an option to further prepare him for this anticipated challenge. Intervention was effective, as evidenced by Travis [name] actively engaging in analysis, which aided him in determining that he feels that moving in with his girlfriend, and getting engaged would help them grow as a couple, although they may need more time to prepare financially for kids to avoid stress from building too quickly.  He was receptive to suggestion about premarital counseling as well, and stated "I think I'll sit down with her this week to talk about that, a financial plan and whether she thinks things are moving too fast".  Clinician will continue to monitor.     Plan: Meet again in 3 weeks.   Diagnosis: Panic Disorder.  Collaboration of Care:   No collaboration of care required at this time.  Travis [name] was encouraged to stay in touch with his prescribing doctor about medication regimen.                                                   Patient/Guardian was advised Release of Information must be obtained prior to any record release in order to collaborate their care with an outside provider. Patient/Guardian was advised if they have not already done  so to contact the registration department to sign all necessary forms in order for [name] to release information regarding their care.    Consent: Patient/Guardian gives verbal consent for treatment and assignment of benefits for services provided during this visit. Patient/Guardian expressed understanding and agreed to proceed.   [name], LCSW, LCAS 04/24/2022

## 2022-04-25 ENCOUNTER — Ambulatory Visit: Payer: 59 | Admitting: Dietician

## 2022-05-03 ENCOUNTER — Other Ambulatory Visit: Payer: Self-pay | Admitting: Family Medicine

## 2022-05-03 MED ORDER — PREDNISONE 20 MG PO TABS: 40.0000 mg | ORAL_TABLET | Freq: Every day | ORAL | 0 refills | Status: AC

## 2022-05-03 MED ORDER — HYDROCODONE-ACETAMINOPHEN 10-325 MG PO TABS: 1.0000 | ORAL_TABLET | Freq: Four times a day (QID) | ORAL | 0 refills | Status: AC | PRN

## 2022-05-14 ENCOUNTER — Ambulatory Visit (INDEPENDENT_AMBULATORY_CARE_PROVIDER_SITE_OTHER): Payer: 59 | Admitting: Licensed Clinical Social Worker

## 2022-05-14 DIAGNOSIS — F410 Panic disorder [episodic paroxysmal anxiety] without agoraphobia: Secondary | ICD-10-CM | POA: Diagnosis not present

## 2022-05-14 DIAGNOSIS — R69 Illness, unspecified: Secondary | ICD-10-CM | POA: Diagnosis not present

## 2022-05-14 NOTE — Progress Notes (Signed)
THERAPIST PROGRESS NOTE   Session Time: 2:00pm - 3:00pm   Location: Patient: [facility] [place] [facility] Therapist: [facility] [place] [facility]   Participation Level: Active   Behavioral Response: Alert, casually dressed, neatly groomed, anxious mood/affect   Type of Therapy: Individual Therapy   Treatment Goals addressed: Coping with anxiety and managing panic attacks; Medication compliance; Improving communication skills and boundaries; Monitoring substance use  Progress Towards Goals: Progressing   Interventions: CBT, problem solving, conflict resolution    Summary: Travis [name] is a 36 year old male mixed race male that presented today for therapy appointment with diagnosis of Panic Disorder.       Suicidal/Homicidal: None; without intent or plan.    Therapist Response:  Clinician met with [name] for in-person therapy appointment and assessed for medication compliance, safety, and sobriety.  Jacier presented for today's session on time and was alert, oriented x5, with no evidence or self-report of active SI/HI or A/V H.  Silviano reported ongoing compliance with medication and denied any abuse of alcohol or illicit substances.  Clinician inquired about Amillion's current emotional ratings, as well as any significant changes in thoughts, feelings or behavior since previous check-in.  Yigit reported scores of 2/10 for depression, 3/10 for anxiety, and 0/10 for anger/irritability.  Bodey denied any recent panic attacks or outbursts.  Ranon reported that a recent struggle has been dealing with relationship issues regarding differences in goals, stating "I thought we were on the same page as far as the direction to go in, and we are still dedicated to working on our issues, but the latest thing has been moving in together and buying a house.  I think she has got cold feet, and I don't think its just the house".  Clinician reviewed material with [name] on healthy boundaries and the various types present in  relationships (i.e. intellectual, emotional, financial, etc), in addition to reinforcing importance of respecting other people's boundaries.  Clinician reviewed previously offered conflict resolution strategies to improve communication between them, ensure safe outlet to express concerns, and space/time to contemplate major relationship decisions (i.e. marriage, buying a house, having children.  Garnell participated in discussion and reported that this made him realize that he may have been too 'pushy' in recent months about moving in together and having kids, which could have led his partner to be more cautious and closed off, stating "I guess I felt betrayed and that is where depression and anxiety have crept in.  It seemed like the commitment level wasn't the same".  Clinician inquired about whether Garmon would consider couples counseling as an option to resolve ongoing relationship struggles with partner, and assisted him in running cost benefit analysis of pursuing the process.  Intervention was effective, as evidenced by Armon's engagement in problem solving process, reporting that they have discussed this before, but could not find a mutual therapist to agree upon and dropped the search for some time. Ayuub reported that he will bring this suggestion up again, and try to be more empathetic about his partner's needs, stating "I want to be in this relationship more than I want that house.  I think this was very useful in helping me understand that I need to pump my breaks and avoid pushing too much.  This will help her be more vulnerable and comfortable about sharing how she feels so we can eventually move forward when she is more comfortable".  Clinician will continue to monitor.     Plan: Meet again in 3 weeks.  Diagnosis: Panic Disorder.  Collaboration of Care:   No collaboration of care required at this time.  Argil was encouraged to stay in touch with his prescribing doctor about medication  regimen.                                                   Patient/Guardian was advised Release of Information must be obtained prior to any record release in order to collaborate their care with an outside provider. Patient/Guardian was advised if they have not already done so to contact the registration department to sign all necessary forms in order for [name] to release information regarding their care.    Consent: Patient/Guardian gives verbal consent for treatment and assignment of benefits for services provided during this visit. Patient/Guardian expressed understanding and agreed to proceed.   [name], [place], LCAS 05/14/2022

## 2022-05-15 ENCOUNTER — Encounter (INDEPENDENT_AMBULATORY_CARE_PROVIDER_SITE_OTHER): Payer: Self-pay

## 2022-06-06 ENCOUNTER — Ambulatory Visit (INDEPENDENT_AMBULATORY_CARE_PROVIDER_SITE_OTHER): Payer: 59 | Admitting: Licensed Clinical Social Worker

## 2022-06-06 DIAGNOSIS — F410 Panic disorder [episodic paroxysmal anxiety] without agoraphobia: Secondary | ICD-10-CM

## 2022-06-06 DIAGNOSIS — R69 Illness, unspecified: Secondary | ICD-10-CM | POA: Diagnosis not present

## 2022-06-06 NOTE — Progress Notes (Signed)
THERAPIST PROGRESS NOTE   Session Time: 1:00pm - 2:00pm    Location: Patient: [facility] [place] [facility] Therapist: [facility] [place] [facility]   Participation Level: Active   Behavioral Response: Alert, casually dressed, neatly groomed, depressed and tearful mood/affect   Type of Therapy: Individual Therapy   Treatment Goals addressed: Coping with anxiety and managing panic attacks; Medication compliance; Monitoring substance use; Safety planning   Progress Towards Goals: Progressing   Interventions: CBT, safety planning, problem solving    Summary: Arby Dahir is a 36 year old male mixed race male that presented today for therapy appointment with diagnosis of Panic Disorder.       Suicidal/Homicidal: None; without intent or plan.    Therapist Response:  Clinician met with [name] for in-person therapy session and assessed for medication compliance, safety, and sobriety.  Numan presented for today's appointment on time and was alert, oriented x5, with no evidence or self-report of active SI/HI or A/V H.  Takota reported ongoing compliance with medication and denied any abuse of alcohol.  He reported that he has been smoking "Half a joint every night before bed" due to increased stress.  Clinician encouraged Juanmanuel to rely upon healthier coping skills to avoid dependence.  Clinician inquired about Kimoni's emotional ratings today, as well as any significant changes in thoughts, feelings or behavior since last check-in.  Elisha reported scores of 7/10 for depression, 3/10 for anxiety, and 0/10 for anger/irritability.  Jeson denied any recent panic attacks or outbursts.  Greco reported that a recent struggle was separating from his long term girlfriend on Thursday when he was honest about frustrations in the relationship.  Kyndall stated "This was really unexpected.  Apparently she felt a lot of pressure about [name] getting a house together and she said she loves me, but doesn't think we should be together".   Jevante reported that he pushed for them to have a couples counseling session tonight to try and salvage the relationship, but friends have discouraged this, stating "They said what she did was a huge red flag so I just don't know what to do".  Clinician assisted Kwane in running a cost benefit analysis regarding whether or not to attend couples counseling tonight and/or make further efforts to salvage the relationship with his girlfriend to aid in decision making process.  Mamoudou participated in analysis and reported that he feels like this will impede the healing process, and even if they can reconcile temporarily, he does not know whether he could learn to trust her again after this heartbreak, stating "This isn't on me.  Its not my fault and I don't know whether she will abandon me again if we have problems in the future".  Domingo disclosed that he has been experiencing passive SI without intent or plan since the separation, last occurring yesterday evening.  Clinician discussed safety planning measures with [name], including identifying warning signs that a crisis could be building, healthy distraction activities to engage in, and reliable supports he can outreach if individual coping methods prove ineffective during crisis.  Jacquis participated in safety planning process and was able to identify several distraction activities to engage in, agreed to speak with his best friends or cousin for support if necessary, call the national suicide hotline, or travel to the behavioral health urgent care center for assessment if necessary should SI return with development of intent and/or plan.  He reported that all of his firearms remain in possession of a friend since the last safety plan was  created, and no other protective measures need to be taken at this time.  Clinician completed a CSSRS screening with [place] as well, reinforcing that he is at no risk of self-harm at this time.  Clinician provided [name]  with a printout of updated safety plan, as he mentioned he keeps the original one on his refrigerator as a useful reminder.  Intervention was effective, as evidenced by [name] competing updated safety plan with clinician following recent crisis, and reporting that he felt relief and clarity from processing recent separation in this session to help determine his next steps to stabilize mental health.  Burak stated "Even though the situation looks bad, I know I can make it through this".  Franki made an appointment to meet next Tuesday to followup.  Clinician will continue to monitor.       Plan: Meet again in 1 week.   Diagnosis: Panic Disorder.  Collaboration of Care:   No collaboration of care required at this time.  Alvester was encouraged to stay in touch with his prescribing doctor about medication regimen.                                                   Patient/Guardian was advised Release of Information must be obtained prior to any record release in order to collaborate their care with an outside provider. Patient/Guardian was advised if they have not already done so to contact the registration department to sign all necessary forms in order for [name] to release information regarding their care.    Consent: Patient/Guardian gives verbal consent for treatment and assignment of benefits for services provided during this visit. Patient/Guardian expressed understanding and agreed to proceed.   [name], [place], LCAS 06/06/2022

## 2022-06-11 ENCOUNTER — Ambulatory Visit (INDEPENDENT_AMBULATORY_CARE_PROVIDER_SITE_OTHER): Payer: 59 | Admitting: Licensed Clinical Social Worker

## 2022-06-11 DIAGNOSIS — R69 Illness, unspecified: Secondary | ICD-10-CM | POA: Diagnosis not present

## 2022-06-11 DIAGNOSIS — F410 Panic disorder [episodic paroxysmal anxiety] without agoraphobia: Secondary | ICD-10-CM

## 2022-06-11 NOTE — Progress Notes (Signed)
THERAPIST PROGRESS NOTE   Session Time: 1:00pm - 2:00pm     Location: Patient: [facility] [place] [facility] Therapist: [facility] [place] [facility]   Participation Level: Active   Behavioral Response: Alert, casually dressed, neatly groomed, depressed mood/affect   Type of Therapy: Individual Therapy   Treatment Goals addressed: Coping with anxiety and managing panic attacks; Medication compliance; Monitoring substance use  Progress Towards Goals: Progressing   Interventions: CBT: self-care assessment p.1    Summary: Koleson Reifsteck is a 36 year old male mixed race male that presented today for therapy appointment with diagnosis of Panic Disorder.       Suicidal/Homicidal: None; without intent or plan.    Therapist Response:  Clinician met with [name] for in-person therapy appointment and assessed for medication compliance, safety, and sobriety.  Davit presented for today's session on time and was alert, oriented x5, with no evidence or self-report of active SI/HI or A/V H.  Orie reported ongoing compliance with medication and denied any abuse of alcohol or illicit substances.  Clinician inquired about Jake's current emotional ratings, as well as any significant changes in thoughts, feelings or behavior since previous check-in.  Taeshawn reported scores of 5/10 for depression, 0/10 for anxiety, and 0/10 for anger/irritability.  Ahmon denied any recent panic attacks or outbursts.  Radwan reported that a recent struggle was attending a couples counseling session last week, which ultimately confirmed that he and his girlfriend would separate, as she could not commit to the relationship.  Stein reported that since this event, he has been focusing more on his own wellbeing and growth, and wished to focus on this today. Clinician discussed topic of self-care today with Hubert and explained how this can be defined as the things one does to maintain good health and improve well-being.  Clinician shared a self-care  assessment form with Hy to complete which featured various sub-categories of self-care, including physical, psychological/emotional, social, spiritual, and professional.  Giankarlo was asked to rank his engagement in the activities listed for each dimension on a scale of 1-3, with 1 indicating 'Poor', 2 indicating 'Ok', and 3 indicating 'Well'.  Clinician invited Vidit to share results of this assessment, and inquired about which areas of self-care he is doing well in, as well as areas that require attention, and how he plans to begin addressing this during treatment.  Intervention was effective, as evidenced by [name] completing initial 2 sections of the assessment, reporting that he is excelling in areas such as taking care of personal hygiene, getting enough sleep, going to preventive medical appointments, resting when sick, taking time off from work, getting away from distractions, and talking about his problems, but would benefit from focusing on other areas such as eat healthy foods, exercise, participating in fun activities, participating in hobbies, expressing his feelings in a healthy way, going on vacations or daytrips, doing something comforting, and finding reasons to laugh.  Osric reported that he will work to improve self-care deficits by cutting back on fast food by eating in more often, focusing more on healthy eating choices like veggies, fruits, lean meat, and more water; start getting outdoors more often and hiking for cardio; going to national park or doing more sports related things like going to ball games, going swimming; game nights with the guys, jazz nights; and scheduling a massage or future vacation.  Bolivar stated "Now that I'm focused on healing and moving forward I think these things will make a big difference".  Clinician will continue to monitor.  Plan: Meet again in 1 week.   Diagnosis: Panic Disorder.  Collaboration of Care:   No collaboration of care required  at this time.  Sylis was encouraged to stay in touch with his prescribing doctor about medication regimen.                                                   Patient/Guardian was advised Release of Information must be obtained prior to any record release in order to collaborate their care with an outside provider. Patient/Guardian was advised if they have not already done so to contact the registration department to sign all necessary forms in order for [name] to release information regarding their care.    Consent: Patient/Guardian gives verbal consent for treatment and assignment of benefits for services provided during this visit. Patient/Guardian expressed understanding and agreed to proceed.   [name], [place], LCAS 06/11/2022

## 2022-06-12 DIAGNOSIS — H40023 Open angle with borderline findings, high risk, bilateral: Secondary | ICD-10-CM | POA: Diagnosis not present

## 2022-06-17 ENCOUNTER — Other Ambulatory Visit: Payer: Self-pay | Admitting: Family Medicine

## 2022-07-09 ENCOUNTER — Ambulatory Visit (INDEPENDENT_AMBULATORY_CARE_PROVIDER_SITE_OTHER): Payer: 59 | Admitting: Licensed Clinical Social Worker

## 2022-07-09 DIAGNOSIS — F410 Panic disorder [episodic paroxysmal anxiety] without agoraphobia: Secondary | ICD-10-CM

## 2022-07-09 DIAGNOSIS — R69 Illness, unspecified: Secondary | ICD-10-CM | POA: Diagnosis not present

## 2022-07-09 NOTE — Progress Notes (Signed)
THERAPIST PROGRESS NOTE   Session Time: 1:00pm - 2:00pm      Location: Patient: [facility] [place] [facility] Therapist: [facility] [place] [facility]   Participation Level: Active   Behavioral Response: Alert, casually dressed, neatly groomed, depressed mood/affect   Type of Therapy: Individual Therapy   Treatment Goals addressed: Coping with anxiety and managing panic attacks; Medication compliance; Monitoring substance use  Progress Towards Goals: Progressing   Interventions: CBT: gratitude journaling    Summary: Travis [name] is a 36 year old male mixed race male that presented today for therapy appointment with diagnosis of Panic Disorder.       Suicidal/Homicidal: None; without intent or plan.    Therapist Response:  Clinician met with [name] for in-person therapy session and assessed for medication compliance, safety, and sobriety.  [name] presented for today's appointment on time and was alert, oriented x5, with no evidence or self-report of active SI/HI or A/V H.  Jaivyn reported ongoing compliance with medication and denied any abuse of alcohol.  Osias reported that he has been smoking one 'joint' of marijuana nightly.  Clinician reminded him of consequences related to use of this substance, and encouraged [name] to abstain from using illicit substances to cope during this time.  Clinician inquired about [name]'s emotional ratings today, as well as any significant changes in thoughts, feelings or behavior since last check-in.  [name] reported scores of 2/10 for depression, 0/10 for anxiety, and 0/10 for anger/irritability.  [name] denied any recent panic attacks or outbursts.  [name] reported that an ongoing struggle has been coping with recent breakup from long-term girlfriend, stating "I read this book on a man's guide to breakups.  I'm not usually a reader, but I think it has been helpful.  I've been working on myself more, but sometimes I just feel down and its hard to find the positives some  days".  Clinician revisited topic of gratitude journaling with [name] today to assist with this issue.  Clinician discussed the benefits this activity offers to one's mental health if practiced regularly, including increased happiness/mood, greater life satisfaction, and resiliency.  Clinician provided [name] with several journal prompts to write about aloud in session today, including examples such as "What was your favorite moment this week so far?", "List 5 things you like about yourself", and "What is one of your greatest life achievements so far?".  Intervention was effective, as evidenced by [name] participating in exercise successfully, and answering all gratitude questions appropriately, which led to productive discussion upon his personal strengths, supports, and accomplishments.  [name] stated "This made me feel good.  I need to do more research on things like this I can do to reinforce positivity". Clinician will continue to monitor.       Plan: Meet again in 1 week.   Diagnosis: Panic Disorder.  Collaboration of Care:   No collaboration of care required at this time.  [name] was encouraged to stay in touch with his prescribing doctor about medication regimen.                                                   Patient/Guardian was advised Release of Information must be obtained prior to any record release in order to collaborate their care with an outside provider. Patient/Guardian was advised if they have not already done so to contact the registration department  to sign all necessary forms in order for [name] to release information regarding their care.    Consent: Patient/Guardian gives verbal consent for treatment and assignment of benefits for services provided during this visit. Patient/Guardian expressed understanding and agreed to proceed.   [name], LCSW, LCAS 07/09/2022

## 2022-07-21 ENCOUNTER — Encounter: Payer: Self-pay | Admitting: Family Medicine

## 2022-07-22 ENCOUNTER — Other Ambulatory Visit: Payer: Self-pay | Admitting: Family Medicine

## 2022-07-22 DIAGNOSIS — N522 Drug-induced erectile dysfunction: Secondary | ICD-10-CM

## 2022-07-22 MED ORDER — SILDENAFIL CITRATE 100 MG PO TABS: 50.0000 mg | ORAL_TABLET | Freq: Every day | ORAL | 2 refills | Status: AC | PRN

## 2022-08-07 ENCOUNTER — Ambulatory Visit (INDEPENDENT_AMBULATORY_CARE_PROVIDER_SITE_OTHER): Payer: 59 | Admitting: Licensed Clinical Social Worker

## 2022-08-07 DIAGNOSIS — F410 Panic disorder [episodic paroxysmal anxiety] without agoraphobia: Secondary | ICD-10-CM | POA: Diagnosis not present

## 2022-08-07 DIAGNOSIS — R69 Illness, unspecified: Secondary | ICD-10-CM | POA: Diagnosis not present

## 2022-08-07 NOTE — Progress Notes (Signed)
THERAPIST PROGRESS NOTE   Session Time: 1:30pm - 2:00pm       Location: Patient: [facility] [place] [facility] Therapist: [facility] [place] [facility]   Participation Level: Active   Behavioral Response: Alert, casually dressed, neatly groomed, euthymic mood/affect   Type of Therapy: Individual Therapy   Treatment Goals addressed: Coping with anxiety and managing panic attacks; Medication compliance; Monitoring substance use; Curbing travel anxiety   Progress Towards Goals: Progressing   Interventions: CBT: challenging anxious thoughts, safety planning      Summary: Travis [name] is a 36 year old male mixed race male that presented today for therapy appointment with diagnosis of Panic Disorder.       Suicidal/Homicidal: None; without intent or plan.    Therapist Response:  Clinician met with Travis [name] for in-person therapy appointment and assessed for medication compliance, safety, and sobriety.  Travis [name] did not present on time for today's appointment, but responded to telephone outreach attempt at 1:07pm, reporting that he had thought the session was scheduled for 2pm instead.  He then presented at 1:30pm and was alert, oriented x5, with no evidence or self-report of active SI/HI or A/V H.  Travis [name] reported ongoing compliance with medication and denied any abuse of alcohol.  Travis [name] reported that he has continued smoking 1-1.5 joints of marijuana nightly to help him fall asleep, and denied any consequences at this time.  Clinician encouraged Travis [name] to implement healthy sleep hygiene changes to avoid dependence upon marijuana.  Clinician inquired about Travis [name]'s current emotional ratings, as well as any significant changes in thoughts, feelings or behavior since previous check-in.  Travis [name] reported scores of 0/10 for depression, 2/10 for anxiety, and 0/10 for anger/irritability.  Travis [name] denied any recent panic attacks or outbursts.  Travis [name] reported that a recent success was gaining the courage to plan a 'world tour' for  self-care, which will involve leaving the day after Thanksgiving to travel to various parts of the world over the course of 56 days, including [place], [place], [place], [place], and [place].  He reported that although he is more comfortable travelling, there are some residual worries that have been nagging at him.  Clinician utilized handout in session today titled "Worry exploration" in order to assist Travis [name] in reducing his anxiety related to upcoming trips.  This worksheet featured a series of Socratic questions aimed at exploring the most likely outcomes for a situation of concern, rather than focusing on the worst possible outcome (i.e. catastrophizing).  Clinician assisted Travis [name] in identifying and challenging any irrational beliefs related to this worry, in addition to utilizing problem solving approach to explore strategies which would help him accomplish goal of completing trip fully without experiencing a panic attack and/or cancelling early.  Travis [name] actively participated in discussion on handout, reporting that he is worried about a few different scenarios occurring while on the trip, including how he will handle a 14 hour flight, how he will deal with language differences in some regions, and lack of professional support since he is aware clinician is not licensed to serve him in areas outside of [place].  Travis [name] reported that there is sufficient evidence to suggest he will be able to cope, as several ideas were proposed for passing the time on the plane productively, as well as solutions for addressing language barrier in foreign areas via use of translation apps on his phone.  Clinician also revisited safety plan with Travis [name] to identify changes that could be made to improve ability to manage crisis effectively, including distraction techniques,  trusted supports he could outreach in his network, and professional help via a list of crisis numbers abroad listed on the [name] Department of [employer].   Intervention was effective, as evidenced by Travis [name] reporting that discussion on this subject reduced his overall anticipatory anxiety regarding upcoming trips, and increased confidence that he can travel successfully without issue.  Travis [name] stated "I'm looking forward to it, and need to remember to avoid focusing too much on the bad stuff that could happen all the time".  Clinician will continue to monitor.       Plan: Meet again in 1 week.   Diagnosis: Panic Disorder.  Collaboration of Care:   No collaboration of care required at this time.  Travis [name] was encouraged to stay in touch with his prescribing doctor about medication regimen.                                                   Patient/Guardian was advised Release of Information must be obtained prior to any record release in order to collaborate their care with an outside provider. Patient/Guardian was advised if they have not already done so to contact the registration department to sign all necessary forms in order for [name] to release information regarding their care.    Consent: Patient/Guardian gives verbal consent for treatment and assignment of benefits for services provided during this visit. Patient/Guardian expressed understanding and agreed to proceed.   [name], LCSW, LCAS 08/07/2022

## 2022-08-12 ENCOUNTER — Other Ambulatory Visit (HOSPITAL_COMMUNITY)
Admission: RE | Admit: 2022-08-12 | Discharge: 2022-08-12 | Disposition: A | Discharge status: Home / Self Care | Payer: 59 | Source: Ambulatory Visit | Attending: Family Medicine | Admitting: Family Medicine

## 2022-08-12 ENCOUNTER — Encounter: Payer: Self-pay | Admitting: Family Medicine

## 2022-08-12 ENCOUNTER — Ambulatory Visit (INDEPENDENT_AMBULATORY_CARE_PROVIDER_SITE_OTHER): Payer: 59 | Admitting: Family Medicine

## 2022-08-12 VITALS — BP 134/78 | HR 82 | Temp 98.1°F | Ht 76.0 in | Wt 367.4 lb

## 2022-08-12 DIAGNOSIS — Z0000 Encounter for general adult medical examination without abnormal findings: Secondary | ICD-10-CM

## 2022-08-12 DIAGNOSIS — R69 Illness, unspecified: Secondary | ICD-10-CM | POA: Diagnosis not present

## 2022-08-12 DIAGNOSIS — Z113 Encounter for screening for infections with a predominantly sexual mode of transmission: Secondary | ICD-10-CM | POA: Diagnosis present

## 2022-08-12 DIAGNOSIS — Z114 Encounter for screening for human immunodeficiency virus [HIV]: Secondary | ICD-10-CM | POA: Diagnosis not present

## 2022-08-12 LAB — COMPREHENSIVE METABOLIC PANEL
ALT: 33 U/L (ref 0–53)
AST: 18 U/L (ref 0–37)
Albumin: 4.6 g/dL (ref 3.5–5.2)
Alkaline Phosphatase: 40 U/L (ref 39–117)
BUN: 7 mg/dL (ref 6–23)
CO2: 27 mEq/L (ref 19–32)
Calcium: 9.4 mg/dL (ref 8.4–10.5)
Chloride: 103 mEq/L (ref 96–112)
Creatinine, Ser: 0.83 mg/dL (ref 0.40–1.50)
GFR: 112.75 mL/min (ref >60.00)
Glucose, Bld: 93 mg/dL (ref 70–99)
Potassium: 4.2 mEq/L (ref 3.5–5.1)
Sodium: 138 mEq/L (ref 135–145)
Total Bilirubin: 0.6 mg/dL (ref 0.2–1.2)
Total Protein: 7.1 g/dL (ref 6.0–8.3)

## 2022-08-12 LAB — CBC
HCT: 45.9 % (ref 39.0–52.0)
Hemoglobin: 15.1 g/dL (ref 13.0–17.0)
MCHC: 32.9 g/dL (ref 30.0–36.0)
MCV: 86.7 fl (ref 78.0–100.0)
Platelets: 203 10*3/uL (ref 150.0–400.0)
RBC: 5.29 Mil/uL (ref 4.22–5.81)
RDW: 13.2 % (ref 11.5–15.5)
WBC: 3.7 10*3/uL — ABNORMAL LOW (ref 4.0–10.5)

## 2022-08-12 LAB — LIPID PANEL
Cholesterol: 183 mg/dL (ref 0–200)
HDL: 55.6 mg/dL (ref >39.00)
LDL Cholesterol: 112 mg/dL — ABNORMAL HIGH (ref 0–99)
NonHDL: 127.37
Total CHOL/HDL Ratio: 3
Triglycerides: 75 mg/dL (ref 0.0–149.0)
VLDL: 15 mg/dL (ref 0.0–40.0)

## 2022-08-12 MED ORDER — ONDANSETRON 4 MG PO TBDP: 4.0000 mg | ORAL_TABLET | Freq: Three times a day (TID) | ORAL | 0 refills | Status: DC | PRN

## 2022-08-12 NOTE — Patient Instructions (Signed)
Give us 2-3 business days to get the results of your labs back.   Keep the diet clean and stay active.  Please get me a copy of your advanced directive form at your convenience.   Let us know if you need anything.  

## 2022-08-12 NOTE — Progress Notes (Signed)
Chief Complaint  Patient presents with   Annual Exam    Well Male Travis [name] is here for a complete physical.   His last physical was >1 year ago.  Current diet: in general, a "healthy" diet.   Current exercise: walking Weight trend: Intentionally losing Fatigue out of ordinary? No. Seat belt? Yes.   Advanced directive? Yes  Health maintenance Tetanus- Yes HIV- Yes Hep C- Yes  Past Medical History:  Diagnosis Date   ADD (attention deficit disorder)    Asthma    childhood   Asthma    Chest pain at rest 05/17/2020   Class 3 severe obesity with serious comorbidity and body mass index (BMI) of 45.0 to 49.9 in adult [facility]) 07/28/2019   Colon polyps    Essential hypertension 03/20/2020   Hip pain    HTN (hypertension)    Insulin resistance 05/25/2020   Intermittent palpitations 05/17/2020   Joint pain    Left hip pain 07/28/2019   Morbid obesity ([place]) 05/17/2020   Near syncope 05/17/2020   Other insomnia 04/03/2020   Overweight    Restless legs syndrome (RLS) 05/17/2020   Snoring 05/17/2020   Vitamin D deficiency 03/20/2020     Past Surgical History:  Procedure Laterality Date   arm Right    TONSILLECTOMY     WISDOM TOOTH EXTRACTION      Medications  Current Outpatient Medications on File Prior to Visit  Medication Sig Dispense Refill   clonazePAM (KLONOPIN) 1 MG tablet Take 1 mg by mouth daily.     diphenhydrAMINE (BENADRYL) 50 MG capsule Take 50 mg by mouth daily.     levocetirizine (XYZAL) 5 MG tablet Take 1 tablet (5 mg total) by mouth every evening. 90 tablet 1   promethazine-dextromethorphan (PROMETHAZINE-DM) 6.25-15 MG/5ML syrup Take 5 mLs by mouth 4 (four) times daily as needed for cough. 118 mL 0   sildenafil (VIAGRA) 100 MG tablet Take 0.5-1 tablets (50-100 mg total) by mouth daily as needed for erectile dysfunction. 30 tablet 2   venlafaxine XR (EFFEXOR-XR) 150 MG 24 hr capsule Take 150 mg by mouth daily with breakfast.     venlafaxine XR (EFFEXOR-XR) 75 MG  24 hr capsule Take 75 mg by mouth daily with breakfast.     Allergies No Known Allergies  Family History Family History  Problem Relation Age of Onset   Obesity Mother    Mental illness Mother    Depression Mother    Bipolar disorder Mother    Sleep apnea Mother    Alcoholism Mother    Eating disorder Mother    Irritable bowel syndrome Sister    Hearing loss Maternal Grandmother     Review of Systems: Constitutional: no fevers or chills Eye:  no recent significant change in vision Ear/Nose/Mouth/Throat:  Ears:  no hearing loss Nose/Mouth/Throat:  no complaints of nasal congestion, no sore throat Cardiovascular:  no chest pain Respiratory:  no shortness of breath Gastrointestinal:  no abdominal pain, no change in bowel habits GU:  Male: negative for dysuria Musculoskeletal/Extremities:  no pain of the joints Integumentary (Skin/Breast):  no abnormal skin lesions reported Neurologic:  no headaches Endocrine: No unexpected weight changes Hematologic/Lymphatic:  no night sweats  Exam BP 134/78 (BP Location: Left Arm, Cuff Size: Large)   Pulse 82   Temp 98.1 F (36.7 C) (Oral)   Ht '6\' 4"'$  (1.93 m)   Wt (!) 367 lb 6 oz (166.6 kg)   SpO2 97%   BMI 44.72 kg/m  General:  well developed, well nourished, in no apparent distress Skin:  no significant moles, warts, or growths Head:  no masses, lesions, or tenderness Eyes:  pupils equal and round, sclera anicteric without injection Ears:  canals without lesions, TMs shiny without retraction, no obvious effusion, no erythema Nose:  nares patent, mucosa normal Throat/Pharynx:  lips and gingiva without lesion; tongue and uvula midline; non-inflamed pharynx; no exudates or postnasal drainage Neck: neck supple without adenopathy, thyromegaly, or masses Lungs:  clear to auscultation, breath sounds equal bilaterally, no respiratory distress Cardio:  regular rate and rhythm, no bruits, no LE edema Abdomen:  abdomen soft, nontender;  bowel sounds normal; no masses or organomegaly Genital (male): Deferred Rectal: Deferred Musculoskeletal:  symmetrical muscle groups noted without atrophy or deformity Extremities:  no clubbing, cyanosis, or edema, no deformities, no skin discoloration Neuro:  gait normal; deep tendon reflexes normal and symmetric Psych: well oriented with normal range of affect and appropriate judgment/insight  Assessment and Plan  Well adult exam - Plan: CBC, Comprehensive metabolic panel, Lipid panel  Routine screening for STI (sexually transmitted infection) - Plan: Urine cytology ancillary only([place])  Screening for HIV (human immunodeficiency virus) - Plan: HIV Antibody (routine testing w rflx)   Well 36 y.o. male. Counseled on diet and exercise.  He was commended for his weight loss. Self testicular exams recommended at least monthly.  Advanced directive form requested today.  Other orders as above. Follow up in 1 year pending the above workup. The patient voiced understanding and agreement to the plan.  [place], DO 08/12/22 10:46 AM

## 2022-08-13 LAB — URINE CYTOLOGY ANCILLARY ONLY
Chlamydia: NEGATIVE
Comment: NEGATIVE
Comment: NEGATIVE
Comment: NORMAL
Neisseria Gonorrhea: NEGATIVE
Trichomonas: NEGATIVE

## 2022-08-13 LAB — HIV ANTIBODY (ROUTINE TESTING W REFLEX): HIV 1&2 Ab, 4th Generation: NONREACTIVE

## 2022-08-30 ENCOUNTER — Other Ambulatory Visit: Payer: Self-pay | Admitting: Family Medicine

## 2022-08-30 MED ORDER — POLYMYXIN B-TRIMETHOPRIM 10000-0.1 UNIT/ML-% OP SOLN: 1.0000 [drp] | OPHTHALMIC | 0 refills | Status: DC

## 2022-09-10 ENCOUNTER — Ambulatory Visit (HOSPITAL_COMMUNITY): Payer: 59 | Admitting: Licensed Clinical Social Worker

## 2022-10-21 ENCOUNTER — Other Ambulatory Visit: Payer: Self-pay | Admitting: Family Medicine

## 2022-10-21 DIAGNOSIS — Z113 Encounter for screening for infections with a predominantly sexual mode of transmission: Secondary | ICD-10-CM

## 2022-10-22 ENCOUNTER — Other Ambulatory Visit (HOSPITAL_COMMUNITY)
Admission: RE | Admit: 2022-10-22 | Discharge: 2022-10-22 | Disposition: A | Discharge status: Home / Self Care | Payer: 59 | Source: Ambulatory Visit | Attending: Family Medicine | Admitting: Family Medicine

## 2022-10-22 ENCOUNTER — Other Ambulatory Visit (INDEPENDENT_AMBULATORY_CARE_PROVIDER_SITE_OTHER): Payer: 59

## 2022-10-22 DIAGNOSIS — R69 Illness, unspecified: Secondary | ICD-10-CM | POA: Diagnosis not present

## 2022-10-22 DIAGNOSIS — Z113 Encounter for screening for infections with a predominantly sexual mode of transmission: Secondary | ICD-10-CM

## 2022-10-22 NOTE — Addendum Note (Signed)
Addended by: Kelle Darting A on: 10/22/2022 11:15 AM   Modules accepted: Orders

## 2022-10-23 LAB — URINE CYTOLOGY ANCILLARY ONLY
Chlamydia: NEGATIVE
Comment: NEGATIVE
Comment: NORMAL
Neisseria Gonorrhea: NEGATIVE

## 2022-10-23 LAB — HIV ANTIBODY (ROUTINE TESTING W REFLEX): HIV 1&2 Ab, 4th Generation: NONREACTIVE

## 2022-10-23 LAB — HEPATITIS C ANTIBODY: Hepatitis C Ab: NONREACTIVE

## 2022-11-05 ENCOUNTER — Ambulatory Visit (INDEPENDENT_AMBULATORY_CARE_PROVIDER_SITE_OTHER): Payer: 59 | Admitting: Licensed Clinical Social Worker

## 2022-11-05 DIAGNOSIS — F410 Panic disorder [episodic paroxysmal anxiety] without agoraphobia: Secondary | ICD-10-CM

## 2022-11-05 NOTE — Progress Notes (Signed)
THERAPIST PROGRESS NOTE   Session Time: 2:00pm -  3:00pm       Location: Patient: [facility] [place] [facility] Therapist: [facility] [place] [facility]   Participation Level: Active   Behavioral Response: Alert, casually dressed, neatly groomed, euthymic mood/affect    Type of Therapy: Individual Therapy   Treatment Goals addressed: Coping with anxiety and managing panic attacks; Medication compliance; Monitoring substance use; Curbing travel anxiety  Progress Towards Goals: Progressing   Interventions: CBT, treatment planning       Summary: Travis [name] is a 37 year old male mixed race male that presented today for therapy appointment with diagnosis of Panic Disorder.       Suicidal/Homicidal: None; without intent or plan.    Therapist Response:  Clinician met with Travis [name] for in-person therapy session and assessed for medication compliance, safety, and sobriety.  Travis [name] presented on time for today's appointment and was alert, oriented x5, with no evidence or self-report of active SI/HI or A/V H.  Travis [name] reported ongoing compliance with medication and denied any abuse of alcohol.  Travis [name] reported that he has continued smoking marijuana daily, averaging around 2 joints, but denied any consequences from this pattern.  Clinician inquired about Travis [name]'s emotional ratings today, as well as any significant changes in thoughts, feelings or behavior since last check-in.  Travis [name] reported scores of 0/10 for depression, 0/10 for anxiety, and 0/10 for anger/irritability.  Travis [name] denied any recent panic attacks or outbursts.  Travis [name] reported that a recent success was going on a nearly 2 month trip to various destinations, including [place], [place], [place], and [place], which was why he has not been engaged in therapy.  Clinician praised Travis [name] for continuing to desensitize himself toward travel anxiety, and inquired about whether he was exposed to animal notable triggers during his journey.  Travis [name] denied any issues  that arose despite brief anxiety in some unfamiliar settings, which he managed with coping skills learned from therapy, stating "It really was the best trip of my life.  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  He stated "We have talked about me going back to school for masters degree, so I decided to apply for it at [employer], and got accepted.  This gave me time to reflect on me, and what I want out of life".  Travis [name] reported that he also enjoyed his time in [place] so much, he may consider relocating there before the end of the year, but will keep clinician updated, as he is aware this would require transition to another provider licensed in that country.  Clinician recommended revisiting treatment plan to identify progress towards goals and set new ones based upon changing needs.  Travis [name] was agreeable to this, so clinician collaborated with him to make changes as follows with his verbal consent:  Meet with clinician for therapy sessions once per month to address progress towards goals, as well as barriers to success; Maintain average anxiety level at 0-1/10 and frequency of panic attacks at 0 per month over next 90 days by practicing 3-4 relaxation and/or grounding skills daily when related symptoms arise, including mindful breathing, progressive muscle relaxation, 5-4-3-2-1 grounding, etc; Set aside 1 hour per day for positive self-care activities in order to ensure healthy work/life balance each week and adequate time to relax outside of busy schedule; Continue documenting details of any panic episodes in journal entries  should they arise in order to increase insight into triggers that could be influencing mood instability, as well as weighing effectiveness of various techniques employed during crisis; Commit to exercising for 3.5 hours per week on average in order to  improve physical and mental fitness, in addition to confidence/self-image; Maintain use of 4-5 sleep hygiene techniques in order to ensure average nightly rest at 7-8 hours over next 90 days and reduce related fatigue/irritability; Continue showing willingness to engage in travel opportunities in order to keep anxiety level in these scenarios low and avoid excessive isolation; Utilize therapy sessions as needed in order to enhance communication style, set appropriate boundaries, and reduce conflict with support system; Continue cutting back on weekly marijuana use via use of coping skills like urge surfing in order to avoid potential consequences; Plan to begin online program for school by April 2024 in order to pursue new career direction; Voluntarily seek hospitalization with assistance from support system and/or medical professionals should SI/HI appear and safety of self or others is determined at risk due to development of intent, plan, and access to means necessary to carry out plan.  Progress is evidenced by Travis [name] reporting that he feels comfortable decreasing frequency of therapy sessions due to stability seen in recent months, maintaining low anxiety levels without reoccurrence of panic attacks, and ensuring adequate time for self-care as an outlet for stress.  Travis [name] reported that he also continues to maintain healthy boundaries with family members that would add to his stress using assertive communication style.  He reported that he is no longer pursuing bariatric surgery, and will instead lose weight through regular exercise and fasting supervised by his PCP.  Travis [name] reported that he continues using marijuana and will use therapy to hold him accountable to moderation.  Clinician will continue to monitor.          Plan: Meet again in 1 month.   Diagnosis: Panic Disorder.  Collaboration of Care:   No collaboration of care required at this time.  Travis [name] was encouraged to stay in touch with his  prescribing doctor about medication regimen.                                                   Patient/Guardian was advised Release of Information must be obtained prior to any record release in order to collaborate their care with an outside provider. Patient/Guardian was advised if they have not already done so to contact the registration department to sign all necessary forms in order for [name] to release information regarding their care.    Consent: Patient/Guardian gives verbal consent for treatment and assignment of benefits for services provided during this visit. Patient/Guardian expressed understanding and agreed to proceed.   [name], [place], LCAS 11/05/22

## 2022-11-27 ENCOUNTER — Ambulatory Visit: Payer: 59 | Admitting: Family Medicine

## 2022-11-27 ENCOUNTER — Encounter: Payer: Self-pay | Admitting: Family Medicine

## 2022-11-27 ENCOUNTER — Ambulatory Visit (INDEPENDENT_AMBULATORY_CARE_PROVIDER_SITE_OTHER): Payer: 59 | Admitting: Family Medicine

## 2022-11-27 VITALS — BP 131/80 | HR 91 | Temp 98.2°F | Ht 76.0 in | Wt 349.2 lb

## 2022-11-27 DIAGNOSIS — D239 Other benign neoplasm of skin, unspecified: Secondary | ICD-10-CM | POA: Diagnosis not present

## 2022-11-27 NOTE — Progress Notes (Signed)
Chief Complaint  Patient presents with   lesion on back    Travis [name] is a 37 y.o. male here for a skin complaint.  Duration:  15 yrs Location: back Pruritic? Yes; slightly  Painful? Yes Drainage? No New soaps/lotions/topicals/detergents? No Trauma? No Other associated symptoms: no fevers or swelling Therapies tried thus far: Tylenol  Past Medical History:  Diagnosis Date   ADD (attention deficit disorder)    Asthma    childhood   Asthma    Chest pain at rest 05/17/2020   Class 3 severe obesity with serious comorbidity and body mass index (BMI) of 45.0 to 49.9 in adult [facility]) 07/28/2019   Colon polyps    Essential hypertension 03/20/2020   Hip pain    HTN (hypertension)    Insulin resistance 05/25/2020   Intermittent palpitations 05/17/2020   Joint pain    Left hip pain 07/28/2019   Morbid obesity ([place]) 05/17/2020   Near syncope 05/17/2020   Other insomnia 04/03/2020   Overweight    Restless legs syndrome (RLS) 05/17/2020   Snoring 05/17/2020   Vitamin D deficiency 03/20/2020    BP 131/80 (BP Location: Right Arm, Patient Position: Sitting, Cuff Size: Large)   Pulse 91   Temp 98.2 F (36.8 C) (Oral)   Ht 6' 4"$  (1.93 m)   Wt (!) 349 lb 4 oz (158.4 kg)   SpO2 93%   BMI 42.51 kg/m  Gen: awake, alert, appearing stated age Lungs: No accessory muscle use Skin: 0.9 cm circular lesion that is hyperpigmented on R upper back.  It does dimple. No drainage, erythema, TTP, fluctuance, excoriation Psych: Age appropriate judgment and insight  Dermatofibroma  Offered excision but stated this is benign. He would like to monitor for future pain/discomfort as he feels a little better now. He will let me know. Tylenol, ice for now. F/u prn. The patient voiced understanding and agreement to the plan.  [place], DO 11/27/22 10:17 AM

## 2022-11-27 NOTE — Patient Instructions (Signed)
Ice/cold pack over area for 10-15 min twice daily.  If you ever wanted this out, let me know.   Let us know if you need anything.

## 2022-12-10 ENCOUNTER — Ambulatory Visit (INDEPENDENT_AMBULATORY_CARE_PROVIDER_SITE_OTHER): Payer: 59 | Admitting: Licensed Clinical Social Worker

## 2022-12-10 DIAGNOSIS — F410 Panic disorder [episodic paroxysmal anxiety] without agoraphobia: Secondary | ICD-10-CM | POA: Diagnosis not present

## 2022-12-10 NOTE — Progress Notes (Signed)
THERAPIST PROGRESS NOTE   Session Time: 2:00pm -  2:50pm   Location: Patient: [facility] Therapist: [facility]   Participation Level: Active   Behavioral Response: Alert, casually dressed, neatly groomed, euthymic mood/affect    Type of Therapy: Individual Therapy   Treatment Goals addressed: Coping with anxiety and managing panic attacks; Medication compliance; Monitoring substance use; Beginning online program    Progress Towards Goals: Progressing   Interventions: CBT: challenging anxious thoughts, problem solving   Summary: Travis [name] is a 37 year old male mixed race male that presented today for therapy appointment with diagnosis of Panic Disorder.       Suicidal/Homicidal: None; without intent or plan.    Therapist Response:  Clinician met with [name] for in-person therapy appointment and assessed for medication compliance, safety, and sobriety.  Michale presented on time for today's session and was alert, oriented x5, with no evidence or self-report of active SI/HI or A/V H.  Angelia reported ongoing compliance with medication and denied any abuse of alcohol.  Samul reported that he has continued smoking marijuana, averaging "1 joint" per night.  Clinician inquired about Izaak's current emotional ratings, as well as any significant changes in thoughts, feelings or behavior since previous check-in.  Keimoni reported scores of 0/10 for depression, 0/10 for anxiety, and 0/10 for anger/irritability.  Kaori denied any recent panic attacks or outbursts.  Katon reported that a recent struggle has been worrying about his upcoming class start date, as he went to orientation over the past weekend, and has met with a Psychologist, clinical, but they were not supportive of his proposed plan, stating "It was horrible".  Jadakiss reported that this has caused him to have reservations about starting the program after all.  Clinician utilized handout in session today titled "Worry  exploration" in order to assist Jerimi in reducing his anxiety related to upcoming class start date.  This worksheet featured a series of Socratic questions aimed at exploring the most likely outcomes for a situation of concern, rather than focusing on the worst possible outcome (i.e. catastrophizing).  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  Johnandrew actively participated in discussion on handout, reporting that he is primarily worried that he will "Get in over his head" with responsibilities, overwhelm himself, and not have the support necessary to succeed.  Meril reported that there is sufficient evidence to suggest he will be able to manage work responsibilities, as he has experience delegating tasks to employees at job sites to reduce stress, but he does not feel supported by staff at the school.  Javontez reported that he remains motivated to pursue his MBA despite this, but will consider meeting with another student success coach to get a second opinion, and look into other schools with [facility] programs that might offer more support.  He reported that he will also postpone start date for class to give himself more time to weigh available options.  Intervention was effective, as evidenced by [name] reporting that this session helped him with decision making process, stating "I need to set myself up for success, and starting now may not make the most sense with the concerns I have.  I prioritize my mental health now, and this is a big commitment  for me".  Clinician will continue to monitor.          Plan: Meet again in 1 month.   Diagnosis: Panic Disorder.  Collaboration of Care:   No collaboration of care  required at this time.  Jessen was encouraged to stay in touch with his prescribing doctor about medication regimen.                                                   Patient/Guardian was advised Release of Information must be obtained prior to any record release in order to collaborate their care with an outside provider. Patient/Guardian was advised if they have not already done so to contact the registration department to sign all necessary forms in order for [name] to release information regarding their care.    Consent: Patient/Guardian gives verbal consent for treatment and assignment of benefits for services provided during this visit. Patient/Guardian expressed understanding and agreed to proceed.   [name], LCSW, LCAS 12/10/22

## 2022-12-16 ENCOUNTER — Other Ambulatory Visit: Payer: Self-pay | Admitting: Family Medicine

## 2022-12-16 DIAGNOSIS — Z3009 Encounter for other general counseling and advice on contraception: Secondary | ICD-10-CM

## 2022-12-30 ENCOUNTER — Ambulatory Visit (INDEPENDENT_AMBULATORY_CARE_PROVIDER_SITE_OTHER): Payer: 59 | Admitting: Licensed Clinical Social Worker

## 2022-12-30 DIAGNOSIS — F410 Panic disorder [episodic paroxysmal anxiety] without agoraphobia: Secondary | ICD-10-CM | POA: Diagnosis not present
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
## 2023-01-14 ENCOUNTER — Ambulatory Visit (INDEPENDENT_AMBULATORY_CARE_PROVIDER_SITE_OTHER): Payer: 59 | Admitting: Licensed Clinical Social Worker

## 2023-01-14 DIAGNOSIS — F410 Panic disorder [episodic paroxysmal anxiety] without agoraphobia: Secondary | ICD-10-CM

## 2023-01-14 NOTE — Progress Notes (Signed)
THERAPIST PROGRESS NOTE   Session Time: 2:00pm - 2:50pm   Location: [facility]rapist: [facility]   Participation Level: Active   Behavioral Response: Alert, casually dressed, neatly groomed, euthymic mood/affect    Type of Therapy: Individual Therapy   Treatment Goals addressed: Coping with anxiety and managing panic attacks; Medication compliance; Monitoring substance use; Beginning online program    Progress Towards Goals: Progressing   Interventions: CBT, DBT skills, discharge planning   Summary: Travis [name] is a 37 year old male mixed race male that presented today for therapy appointment with diagnosis of Panic Disorder.       Suicidal/Homicidal: None; without intent or plan.    Therapist Response:  Clinician met with Travis [name] for in-person therapy session and assessed for medication compliance, safety, and sobriety.  Travis [name] presented on time for today's appointment and was alert, oriented x5, with no evidence or self-report of active SI/HI or A/V H.  Travis [name] reported ongoing compliance with medication and denied any abuse of alcohol.  Travis [name] reported that he has continued smoking marijuana, averaging 1-2 'joints' per night.  Clinician inquired about Travis [name]'s current emotional ratings, as well as any significant changes in thoughts, feelings or behavior since last check-in.  Travis [name] reported scores of 0/10 for depression, 0/10 for anxiety, and 0/10 for anger/irritability.  Travis [name] denied any recent panic attacks or outbursts.  Travis [name] reported that a recent success was having a small dose of Adderal adding to his medication regimen, stating "When I started studying it felt like I just couldn't focus".  Travis [name] reported that his first classes 2 weeks ago, but at times he feels overwhelmed by the amount of work he needs to complete, stating "It can be intense and rigorous".  Clinician utilized a DBT handout with Travis [name] to assist.  This handout focused on a  distress tolerance approach which is represented by the acronym ACCEPTS, and outlines strategies for distracting oneself from distressing emotions, allowing appropriate time to lesson in intensity and eventually fade away.  Strategies offered included engaging in positive activities, contributing to the wellbeing of others, comparing one's present situation to a previously difficult one to highlight resilience, using mental imagery, and physical grounding.  Clinician assisted Travis [name] in created a realistic ACCEPTS plan for tackling a distressing emotion of his choice.  Intervention was effective, as evidenced by Travis [name] participating in creation of the plan, choosing overwhelming as his emotion of focus, and identifying several helpful approaches for distraction, such as walking around his house or in the backyard, playing basketball, calling a supportive friend to talk about their day, comparing his situation to another classmate that might not be coping as well to put things in perspective, listening to relaxing music, engaging in bird watching outside, chewing gum with focus on the flavor/taste, and whistling an upbeat tune.  Clinician also informed Travis [name] that he will be discharged from this provider's care after 02/07/23 due to employment ending with [employer].  Clinician offered to assist Travis [name] in connecting with a new therapist in order to continue work upon present treatment goals and ensure continuation of care.  Clinician also offered resources that could be useful during Travis [name]'s transition, including free group support through local agencies such as [employer].  Clinician encouraged Travis [name] to continue following safety plan, including outreaching 911, 988, and/or visiting local hospital for assessment should SI/HI arise and pose risk of harm to self and/or others. Travis [name] expressed understanding, reported that he will plan to begin looking for  a new therapist, and agreed to continue following  safety plan.  Clinician will continue to monitor.          Plan: Meet again in 1 month.   Diagnosis: Panic Disorder.  Collaboration of Care:   No collaboration of care required at this time.  Travis [name] was encouraged to stay in touch with his prescribing doctor about medication regimen.                                                   Patient/Guardian was advised Release of Information must be obtained prior to any record release in order to collaborate their care with an outside provider. Patient/Guardian was advised if they have not already done so to contact the registration department to sign all necessary forms in order for [name] to release information regarding their care.    Consent: Patient/Guardian gives verbal consent for treatment and assignment of benefits for services provided during this visit. Patient/Guardian expressed understanding and agreed to proceed.   Travis [name], [place], LCAS 01/14/23

## 2023-01-21 DIAGNOSIS — H40023 Open angle with borderline findings, high risk, bilateral: Secondary | ICD-10-CM | POA: Diagnosis not present

## 2023-01-22 ENCOUNTER — Ambulatory Visit (HOSPITAL_COMMUNITY): Payer: 59 | Admitting: Licensed Clinical Social Worker

## 2023-02-11 ENCOUNTER — Ambulatory Visit (INDEPENDENT_AMBULATORY_CARE_PROVIDER_SITE_OTHER): Payer: 59 | Admitting: Licensed Clinical Social Worker

## 2023-02-11 DIAGNOSIS — F410 Panic disorder [episodic paroxysmal anxiety] without agoraphobia: Secondary | ICD-10-CM | POA: Diagnosis not present

## 2023-02-11 NOTE — Progress Notes (Signed)
THERAPIST PROGRESS NOTE   Session Time: 2:00pm - 2:50pm    Location: [facility]rapist[facility]   Participation Level: Active   Behavioral Response: Alert, casually dressed, neatly groomed, anxious mood/affect    Type of Therapy: Individual Therapy   Treatment Goals addressed: Coping with anxiety and managing panic attacks; Medication compliance; Monitoring substance use; Beginning online program    Progress Towards Goals: Progressing   Interventions: CBT, stress management    Summary: Keymarion Mumma is a 37 year old male mixed race male that presented today for therapy appointment with diagnosis of Panic Disorder.       Suicidal/Homicidal: None; without intent or plan.    Therapist Response:  Clinician met with [name] for in-person therapy appointment and assessed for medication compliance, safety, and sobriety.  Ankith presented on time for today's session and was alert, oriented x5, with no evidence or self-report of active SI/HI or A/V H.  Hanh reported ongoing compliance with medication and denied any abuse of alcohol.  Desiree reported that he has continued smoking 1-2 joints of marijuana in the evening each day and denied any notable consequences.  Clinician inquired about Melquisedec's emotional ratings today, as well as any significant changes in thoughts, feelings or behavior since previous check-in.  Juniper reported scores of 0/10 for depression, 3/10 for anxiety, and 0/10 for anger/irritability.  Chandra denied any recent panic attacks or outbursts.  Elishua reported that a recent success was speaking with his doctor about challenges with focusing at school, and being prescribed Adderall to assist, which has helped.  He reported that he is now at week 6 of his 10 week program, although his anxiety has increased and he has felt overwhelmed at times.  Clinician revisited topic of stress management with Suhan today to assist with alleviating anxiety related  to school.  Clinician utilized a handout to guide discussion which explained how not all stressors are necessarily bad, and can actually motivate [name] to make positive changes in life.  This handout explained the difference between distress, which is stress that adversely affects [name], and eustress, which can be energizing and help [name] overcome obstacles.  Examples of common distress stressors (i.e. deadlines, financial crisis, relationships problems, injuries, work issues) and eustress stressors (i.e. exercise, meditation, achieving a goal, etc) were compared, along with examples of common physiological and psychological changes that could be observed in these scenarios to differentiate between them.  Strategies were offered to help change his mindset when faced with distress in order to successfully tackle life challenges more effectively, such as using deep breathing, meditation, exercise, journaling, positive self-talk, or speaking with a supportive peer.  Intervention was effective, as evidenced by [name] actively engaging in discussion on topic, reporting that there have been several warning signs recently that he is experiencing more distress, including reduced sleep, increased fatigue, headaches, feeling overwhelmed, and reduced sex drive.  Dvid expressed receptiveness to setting healthier boundaries with his school/work schedule in order to ensure balance towards eustress, including more positive self-care time for activities like exercising, practicing coping skills learned from therapy, scheduling massages, taking breaks throughout the day, and spending time with friends.  Demetry stated "This is good. I think this will be very helpful".  Clinician will continue to monitor.          Plan: Meet again in 1 month.   Diagnosis: Panic Disorder.  Collaboration of Care:   No collaboration of care required at this time.  Jaterrius was  encouraged to stay in touch with his prescribing doctor about medication  regimen.                                                   Patient/Guardian was advised Release of Information must be obtained prior to any record release in order to collaborate their care with an outside provider. Patient/Guardian was advised if they have not already done so to contact the registration department to sign all necessary forms in order for [name] to release information regarding their care.    Consent: Patient/Guardian gives verbal consent for treatment and assignment of benefits for services provided during this visit. Patient/Guardian expressed understanding and agreed to proceed.   Noralee Stain, LCSW, LCAS 02/11/23

## 2023-03-07 ENCOUNTER — Other Ambulatory Visit: Payer: Self-pay | Admitting: Family Medicine

## 2023-03-07 ENCOUNTER — Telehealth: Payer: Self-pay | Admitting: Family Medicine

## 2023-03-07 DIAGNOSIS — Z113 Encounter for screening for infections with a predominantly sexual mode of transmission: Secondary | ICD-10-CM

## 2023-03-07 DIAGNOSIS — Z114 Encounter for screening for human immunodeficiency virus [HIV]: Secondary | ICD-10-CM

## 2023-03-07 NOTE — Telephone Encounter (Signed)
Called left detailed message to call back to schedule just a LAB appointment

## 2023-03-07 NOTE — Telephone Encounter (Signed)
Called and scheduled lab appt for Monday.

## 2023-03-10 ENCOUNTER — Other Ambulatory Visit (HOSPITAL_COMMUNITY)
Admission: RE | Admit: 2023-03-10 | Discharge: 2023-03-10 | Disposition: A | Discharge status: Home / Self Care | Payer: 59 | Source: Ambulatory Visit | Attending: Family Medicine | Admitting: Family Medicine

## 2023-03-10 ENCOUNTER — Other Ambulatory Visit (INDEPENDENT_AMBULATORY_CARE_PROVIDER_SITE_OTHER): Payer: 59

## 2023-03-10 DIAGNOSIS — Z114 Encounter for screening for human immunodeficiency virus [HIV]: Secondary | ICD-10-CM

## 2023-03-10 DIAGNOSIS — Z113 Encounter for screening for infections with a predominantly sexual mode of transmission: Secondary | ICD-10-CM

## 2023-03-11 ENCOUNTER — Ambulatory Visit (INDEPENDENT_AMBULATORY_CARE_PROVIDER_SITE_OTHER): Payer: 59 | Admitting: Licensed Clinical Social Worker

## 2023-03-11 DIAGNOSIS — F410 Panic disorder [episodic paroxysmal anxiety] without agoraphobia: Secondary | ICD-10-CM

## 2023-03-11 LAB — URINE CYTOLOGY ANCILLARY ONLY
Chlamydia: NEGATIVE
Comment: NEGATIVE
Comment: NEGATIVE
Comment: NORMAL
Neisseria Gonorrhea: NEGATIVE
Trichomonas: NEGATIVE

## 2023-03-11 LAB — HIV ANTIBODY (ROUTINE TESTING W REFLEX): HIV 1&2 Ab, 4th Generation: NONREACTIVE

## 2023-03-11 NOTE — Progress Notes (Signed)
THERAPIST PROGRESS NOTE   Session Time: 2:09pm - 3:00pm   Location: [facility]rapist[facility]   Participation Level: Active   Behavioral Response: Alert, casually dressed, neatly groomed, anxious mood/affect   Type of Therapy: Individual Therapy   Treatment Goals addressed: Coping with anxiety and managing panic attacks; Medication compliance; Monitoring substance use; Setting healthy boundaries in support network   Progress Towards Goals: Progressing   Interventions: CBT, psychoeducation on healthy relationships    Summary: Travis [name] is a 37 year old male mixed race male that presented today for therapy appointment with diagnosis of Panic Disorder.       Suicidal/Homicidal: None; without intent or plan.    Therapist Response:  Clinician met with Travis [name] for in-person therapy session and assessed for medication compliance, safety, and sobriety.  Travis [name] presented for today's appointment 9 minutes late, but informed clinician ahead of time via email regarding tardiness.  Travis [name] presented as alert, oriented x5, with no evidence or self-report of active SI/HI or A/V H.  Travis [name] reported ongoing compliance with medication and denied any abuse of alcohol.  Travis [name] reported that he has continued smoking 1-2 joints of marijuana in the evening each day and denied any notable consequences.  Clinician inquired about Travis [name]'s current emotional ratings, as well as any significant changes in thoughts, feelings or behavior since last check-in.  Travis [name] reported scores of 0/10 for depression, 2/10 for anxiety, and 0/10 for anger/irritability.  Travis [name] denied any recent panic attacks or outbursts.  Travis [name] reported that a recent success was connecting with a new romantic partner after giving himself adequate time to focus more on himself for several months, although they have been experiencing some more issues in the relationship that have given him second thoughts on moving  forward.  Clinician reviewed psychoeducation on traits of healthy relationships using a handout to guide discussion with Travis [name] on the subject.  This handout explained how attachments to connections within one's support network (i.e. friends, family, romantic partners, coworkers, Catering manager) can influence one's mental health, and emphasized importance of looking for green lights (positive behaviors) that can be considered normal, as well as red lights (harmful behaviors) which suggest that a boundary should be established. Travis [name] was tasked with identifying green lights (i.e. respect, trust, appreciation, healthy conflict resolution, etc) and red lights (i.e. contempt, suspicion, impatience, lack of growth, etc) that were present in recent interactions in order to help him determine whether this relationship is worth pursuing and how boundaries should be managed between them.  Intervention was effective, as evidenced by Travis [name] actively participating in discussion on subject, and identifying several unhealthy traits that his partner displayed recently in the relationship, such lack of communication, contempt, and poor conflict resolution.  Travis [name] reported that he himself has also been dishonest at times about his feelings and is actively maintaining a rigid boundary about how he truly feels, but will begin making more effort to be vulnerable and avoid communication traps like stonewalling.  Travis [name] reported that this subject was helpful to cover, stating "I'm okay with this becoming something more, but I just need to take things slow".  Clinician will continue to monitor.          Plan: Meet again in 1 month.   Diagnosis: Panic Disorder.  Collaboration of Care:   No collaboration of care required at this time.  Sotero was encouraged to stay in touch with his prescribing doctor about medication regimen.  Patient/Guardian was advised Release of Information must be  obtained prior to any record release in order to collaborate their care with an outside provider. Patient/Guardian was advised if they have not already done so to contact the registration department to sign all necessary forms in order for [name] to release information regarding their care.    Consent: Patient/Guardian gives verbal consent for treatment and assignment of benefits for services provided during this visit. Patient/Guardian expressed understanding and agreed to proceed.   Noralee Stain, LCSW, LCAS 03/11/23

## 2023-04-07 DIAGNOSIS — F331 Major depressive disorder, recurrent, moderate: Secondary | ICD-10-CM | POA: Diagnosis not present

## 2023-04-07 DIAGNOSIS — F900 Attention-deficit hyperactivity disorder, predominantly inattentive type: Secondary | ICD-10-CM | POA: Diagnosis not present

## 2023-04-07 DIAGNOSIS — F410 Panic disorder [episodic paroxysmal anxiety] without agoraphobia: Secondary | ICD-10-CM | POA: Diagnosis not present

## 2023-04-08 ENCOUNTER — Ambulatory Visit (INDEPENDENT_AMBULATORY_CARE_PROVIDER_SITE_OTHER): Payer: 59 | Admitting: Licensed Clinical Social Worker

## 2023-04-08 DIAGNOSIS — F410 Panic disorder [episodic paroxysmal anxiety] without agoraphobia: Secondary | ICD-10-CM | POA: Diagnosis not present

## 2023-04-08 NOTE — Progress Notes (Signed)
THERAPIST PROGRESS NOTE   Session Time: 2:00pm - 3:00pm   Location: [facility]rapist[facility]   Participation Level: Active   Behavioral Response: Alert, casually dressed, neatly groomed, anxious mood/affect   Type of Therapy: Individual Therapy   Treatment Goals addressed: Coping with anxiety and managing panic attacks; Medication compliance; Monitoring substance use; Setting healthy boundaries in support network; Following safety plan   Progress Towards Goals: Progressing   Interventions: CBT, treatment planning    Summary: Travis [name] is a 37 year old male mixed race male that presented today for therapy appointment with diagnosis of Panic Disorder.       Suicidal/Homicidal: None; without intent or plan.    Therapist Response:  Clinician met with Travis [name] for in-person therapy appointment and assessed for medication compliance, safety, and sobriety.  Travis [name] presented for today's session on time and was alert, oriented x5, with no evidence or self-report of active SI/HI or A/V H.  Travis [name] reported ongoing compliance with medication and denied any abuse of alcohol.  Travis [name] reported that he has continued smoking 1-2 joints of marijuana in the evening each day and denied any notable consequences.  Clinician inquired about Travis [name]'s emotional ratings today, as well as any significant changes in thoughts, feelings or behavior since previous check-in.  Travis [name] reported scores of 0/10 for depression, 1/10 for anxiety, and 0/10 for anger/irritability.  Travis [name] denied any recent panic attacks or outbursts.  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  Travis [name] reported that he is also planning to get married now, and he is optimistic about proposing to his partner when they go to [place] soon.  He reported that they are looking into pre-marital counseling in order to identify any areas to  work on together beforehand.  Travis [name] denied any major issues that have arisen since our last session.  Clinician proposed revisiting treatment plan today to determine progress toward goals, and any barriers to success left unaddressed.  Travis [name] was agreeable to this, and collaborated with clinician to make updates as follows with his verbal consent: Meet with clinician for therapy sessions once per month to address progress towards goals, as well as barriers to success; Take behavioral medication as prescribed and meet with provider once every 3 months for monitoring of efficacy; Maintain average anxiety level at 0-1/10 and frequency of panic attacks at 0 per month over next 90 days by practicing 3-4 relaxation and/or grounding skills daily when related symptoms arise, including mindful breathing, progressive muscle relaxation, 5-4-3-2-1 grounding, etc; Set aside 1 hour per day for positive self-care activities in order to ensure healthy work/life balance each week and adequate time to relax outside of busy schedule; Continue documenting details of any panic episodes in journal entries should they arise in order to increase insight into triggers that could be influencing mood instability, as well as weighing effectiveness of various techniques employed during crisis; Commit to exercising for 3.5 hours per week on average in order to improve physical and mental fitness, in addition to confidence/self-image; Maintain use of 4-5 sleep hygiene techniques in order to ensure average nightly rest at 7-8 hours over next 90 days and reduce related fatigue/irritability; Continue showing willingness to engage in travel opportunities in order to keep anxiety level in these scenarios low and avoid excessive isolation; Utilize therapy sessions as needed in order to enhance communication style, set appropriate boundaries, and reduce  conflict with support system; Approach marijuana/THC use with harm reduction methodology, limiting  use to "1-2 joints" per night in order to avoid potential consequences; Plan to complete online program for school by September 2025 in order to pursue new career direction; Voluntarily seek hospitalization with assistance from support system and/or medical professionals should SI/HI appear and safety of self or others is determined at risk due to development of intent, plan, and access to means necessary to carry out plan.  Progress is evidenced by Travis [name] reporting that he has been able to maintain low level of anxiety, and avoid reoccurrence of panic attacks due to successful use of coping skills learned from therapy.  He reported that he is also setting aside adequate time for self-care throughout the week to manage stress, is meeting with medical provider regularly, and taking medication as prescribed.  Travis [name] reported that he has maintained harm reduction approach to marijuana use, with no new consequences to note which would motivate him to cut back further. He reported that he has started school and will hope to complete masters program by late next year.  Travis [name] reported that he remains motivated to continue with therapy, stating "It's always good to check in with you.  This helps me work through problems and make sure I'm not forgetting about my self-care".  Clinician will continue to monitor.          Plan: Meet again in 1 month.   Diagnosis: Panic Disorder.  Collaboration of Care:   No collaboration of care required at this time.  Travis [name] was encouraged to stay in touch with his prescribing doctor about medication regimen.                                                   Patient/Guardian was advised Release of Information must be obtained prior to any record release in order to collaborate their care with an outside provider. Patient/Guardian was advised if they have not already done so to contact the registration department to sign all necessary forms in order for [name] to release information  regarding their care.    Consent: Patient/Guardian gives verbal consent for treatment and assignment of benefits for services provided during this visit. Patient/Guardian expressed understanding and agreed to proceed.   Travis Stain, LCSW, LCAS 04/08/23

## 2023-05-13 ENCOUNTER — Ambulatory Visit (INDEPENDENT_AMBULATORY_CARE_PROVIDER_SITE_OTHER): Payer: 59 | Admitting: Licensed Clinical Social Worker

## 2023-05-13 DIAGNOSIS — F410 Panic disorder [episodic paroxysmal anxiety] without agoraphobia: Secondary | ICD-10-CM | POA: Diagnosis not present

## 2023-05-13 NOTE — Progress Notes (Signed)
THERAPIST PROGRESS NOTE   Session Time: 2:00pm - 3:00pm   Location: [facility] Therapist: [facility]   Participation Level: Active   Behavioral Response: Alert, casually dressed, neatly groomed, euthymic mood/affect   Type of Therapy: Individual Therapy   Treatment Goals addressed: Coping with anxiety and managing panic attacks; Medication compliance; Monitoring substance use; Setting healthy boundaries in support network  Progress Towards Goals: Progressing   Interventions: CBT, conflict resolution skills    Summary: Marris Loran is a 37 year old male mixed race male that presented today for therapy appointment with diagnosis of Panic Disorder.       Suicidal/Homicidal: None; without intent or plan.    Therapist Response:  Clinician met with [name] for in-person therapy session and assessed for medication compliance, safety, and sobriety.  Aedric presented for today's appointment on time and was alert, oriented x5, with no evidence or self-report of active SI/HI or A/V H.  Yitzchok reported ongoing compliance with medication and denied any abuse of alcohol.  Jaisen reported that he has continued smoking 1-2 joints of marijuana in the evening each day and denied any notable consequences.  Clinician inquired about Ebrahim's current emotional ratings, as well as any significant changes in thoughts, feelings or behavior since last check-in.  Lorene reported scores of 0/10 for depression, 0/10 for anxiety, and 0/10 for anger/irritability.  Severo denied any recent panic attacks or outbursts.  Waldron reported that a recent success has been attending premarital counseling with his fiance, and having 2 positive sessions so far.  Sirron reported that there have been some "Bumps along with way", though, and he has struggled to address some issues at times in a way that would avoid arguments.  Clinician covered topic of conflict resolution today and shared a handout on subject  with Makel which warned against the 'four horsemen' of communication traps that should be avoided due to tendency to escalate and damage a relationship.  These included criticism, defensiveness, contempt, and stonewalling.  'Antidotes' to these harmful behaviors were offered as healthy replacements to improve communication and understanding, including approaching problems with a gentle startup approach, taking responsibility for one's behavior, sharing fondness/admiration, and using self-soothing to calm down and focus on the problem at hand.  Clinician encouraged Jayon to apply this material to recent experiences with conflict that he has faced, consider which approach he utilized, and any changes that he could plan to implement in order to improve overall conflict resolution skills.  Intervention was effective, as evidenced by [name] actively participating in discussion on topic, reporting that he has engaged in some of these communication traps before, including criticism, defensiveness, and stonewalling.  Anurag reported that this has led to consequences such as friction with past partners, and communication breakdown when issues arise.  Jhaden expressed receptiveness to alternative strategies offered in order to improve conflict resolution skills, including practicing "I" statements regularly in order to express his concerns in a calm, but assertive manner, taking responsibility when he makes mistakes in the relationship, and using relaxation skills to manage strong feelings like anger or anxiety that might arise during disagreements.  Hanzel stated "You're a good Runner, broadcasting/film/video.  I appreciate this handout".  Clinician will continue to monitor.          Plan: Meet again in 1 month.   Diagnosis: Panic Disorder.  Collaboration of Care:   No collaboration of care required at this time.  Marson was encouraged to stay in touch with his prescribing  doctor about medication regimen.                                                    Patient/Guardian was advised Release of Information must be obtained prior to any record release in order to collaborate their care with an outside provider. Patient/Guardian was advised if they have not already done so to contact the registration department to sign all necessary forms in order for [name] to release information regarding their care.    Consent: Patient/Guardian gives verbal consent for treatment and assignment of benefits for services provided during this visit. Patient/Guardian expressed understanding and agreed to proceed.   Noralee Stain, LCSW, LCAS 05/13/23

## 2023-05-21 ENCOUNTER — Other Ambulatory Visit: Payer: Self-pay | Admitting: Family Medicine

## 2023-05-21 MED ORDER — ONDANSETRON 4 MG PO TBDP: 4.0000 mg | ORAL_TABLET | Freq: Three times a day (TID) | ORAL | 0 refills | Status: DC | PRN

## 2023-05-21 MED ORDER — ACETAZOLAMIDE 125 MG PO TABS: ORAL_TABLET | ORAL | 0 refills | Status: DC

## 2023-05-28 DIAGNOSIS — F410 Panic disorder [episodic paroxysmal anxiety] without agoraphobia: Secondary | ICD-10-CM | POA: Diagnosis not present

## 2023-05-28 DIAGNOSIS — F900 Attention-deficit hyperactivity disorder, predominantly inattentive type: Secondary | ICD-10-CM | POA: Diagnosis not present

## 2023-05-28 DIAGNOSIS — F331 Major depressive disorder, recurrent, moderate: Secondary | ICD-10-CM | POA: Diagnosis not present

## 2023-06-10 ENCOUNTER — Ambulatory Visit (HOSPITAL_COMMUNITY): Payer: 59 | Admitting: Licensed Clinical Social Worker

## 2023-06-10 ENCOUNTER — Telehealth (INDEPENDENT_AMBULATORY_CARE_PROVIDER_SITE_OTHER): Payer: 59 | Admitting: Licensed Clinical Social Worker

## 2023-06-10 NOTE — Telephone Encounter (Signed)
Travis [name] had an in-person therapy appointment scheduled today for 2pm.  Clinician outreached Travis [name] at 2:06pm when he had not presented on time.  Travis [name] did not answer this phone call, so a voicemail reminder was left, along with contact number for our front desk.  Clinician informed front desk of no-show event when Travis [name] had not shown up by 2:15pm.    Noralee Stain, LCSW, LCAS 06/10/23

## 2023-06-30 DIAGNOSIS — F410 Panic disorder [episodic paroxysmal anxiety] without agoraphobia: Secondary | ICD-10-CM | POA: Diagnosis not present

## 2023-06-30 DIAGNOSIS — F331 Major depressive disorder, recurrent, moderate: Secondary | ICD-10-CM | POA: Diagnosis not present

## 2023-06-30 DIAGNOSIS — F900 Attention-deficit hyperactivity disorder, predominantly inattentive type: Secondary | ICD-10-CM | POA: Diagnosis not present

## 2023-07-09 ENCOUNTER — Ambulatory Visit (INDEPENDENT_AMBULATORY_CARE_PROVIDER_SITE_OTHER): Payer: 59 | Admitting: Licensed Clinical Social Worker

## 2023-07-09 DIAGNOSIS — F410 Panic disorder [episodic paroxysmal anxiety] without agoraphobia: Secondary | ICD-10-CM

## 2023-07-09 NOTE — Progress Notes (Signed)
THERAPIST PROGRESS NOTE   Session Time: 2:00pm - 3:00pm    Location: [facility]rapist[facility]   Participation Level: Active   Behavioral Response: Alert, casually dressed, neatly groomed, anxious mood/affect   Type of Therapy: Individual Therapy   Treatment Goals addressed: Coping with anxiety and managing panic attacks; Medication compliance; Monitoring substance use; Taking advantage of travel opportunities   Progress Towards Goals: Progressing   Interventions: CBT, guided imagery, relaxation skills    Summary: Travis [name] is a 37 year old male mixed race male that presented today for therapy appointment with diagnosis of Panic Disorder.       Suicidal/Homicidal: None; without intent or plan.    Therapist Response:  Clinician met with [name] for in-person therapy appointment and assessed for medication compliance, safety, and sobriety.  Jaylenn presented for today's session on time and was alert, oriented x5, with no evidence or self-report of active SI/HI or A/V H.  Draylen reported ongoing compliance with medication and denied any abuse of alcohol.  Labron reported that he has continued smoking 1-2 joints of marijuana in the evening each day and denied any notable consequences.  Clinician inquired about [name]'s emotional ratings today, as well as any significant changes in thoughts, feelings or behavior since previous check-in.  Wayburn reported scores of 1/10 for depression, 4/10 for anxiety, and 0/10 for anger/irritability.  Shakai denied any recent outbursts.  Hiro reported that a recent struggle was traveling to [place] for a school trip.  He reported that he was proactive and tried to work with staff to secure accommodations to make this less stressful, but they were unable to assist in many areas, such as informing him where they would be staying, what the schedule looked like, or ensuring he would have preferred seating on the plane ride there.   Jachai reported that his anxiety had been gradually building up until he took the trip, and on the weekend of the trip, he noticed familiar symptoms of a panic attack arising, and staff was not helpful in de-escalation, and made the situation worse, so he opted to cancel his plans and fly home to rest.  Zacarias reported that since then, anxiety has been high and symptoms have been more prevalent at times such as tingling in his arm, tension, worrying, and racing thoughts.  Clinician invited [name] to practice a new meditation exercise to add to self-care routine in order to better manage daily stress and tension.  Clinician provided instruction on process of getting comfortable, achieving relaxing breathing rhythm, and visualizing a protective light moving across the muscle groups within his body to identify and eliminate areas of tension associated with stress over course of 15 minutes practice.  Clinician inquired about how Kendell mentally and physically felt afterward, any challenges he faced in concentration, and whether he would be motivated to practice this in free time to improve coping ability.  Intervention was effective, as evidenced by [name] actively engaging in exercise, and reporting that this was helpful for calming his mind and body, reducing his anxiety down to 2/10 in severity.  He stated "I was able to calm my mind about 40% of the time.  That leads me to believe that I should schedule another meeting with you soon.  I need to refresh my tools as I go through this anxious period".  Sostenes reported that he has also started Buspirone from his doctor, and hopes this further help symptom management following recent stressors.  Clinician  will continue to monitor.          Plan: Meet again in 1 month.   Diagnosis: Panic Disorder.  Collaboration of Care:   No collaboration of care required at this time.  Jamien was encouraged to stay in touch with his prescribing doctor about medication  regimen.                                                   Patient/Guardian was advised Release of Information must be obtained prior to any record release in order to collaborate their care with an outside provider. Patient/Guardian was advised if they have not already done so to contact the registration department to sign all necessary forms in order for [name] to release information regarding their care.    Consent: Patient/Guardian gives verbal consent for treatment and assignment of benefits for services provided during this visit. Patient/Guardian expressed understanding and agreed to proceed.   Noralee Stain, LCSW, LCAS 07/09/23

## 2023-07-24 DIAGNOSIS — H40023 Open angle with borderline findings, high risk, bilateral: Secondary | ICD-10-CM | POA: Diagnosis not present

## 2023-08-13 ENCOUNTER — Ambulatory Visit (INDEPENDENT_AMBULATORY_CARE_PROVIDER_SITE_OTHER): Payer: 59 | Admitting: Licensed Clinical Social Worker

## 2023-08-13 DIAGNOSIS — F410 Panic disorder [episodic paroxysmal anxiety] without agoraphobia: Secondary | ICD-10-CM

## 2023-08-13 NOTE — Progress Notes (Signed)
THERAPIST PROGRESS NOTE   Session Time: 2:00pm - 2:50pm     Location: [facility]rapist[facility]   Participation Level: Active   Behavioral Response: Alert, casually dressed, neatly groomed, anxious mood/affect   Type of Therapy: Individual Therapy   Treatment Goals addressed: Coping with anxiety and managing panic attacks; Medication compliance; Monitoring substance use  Progress Towards Goals: Progressing   Interventions: CBT, problem solving, communication skills   Summary: Travis [name] is a 37 year old male mixed race male that presented today for therapy appointment with diagnosis of Panic Disorder.       Suicidal/Homicidal: None; without intent or plan.    Therapist Response:  Clinician met with Travis [name] for in-person therapy session and assessed for medication compliance, safety, and sobriety.  Travis [name] presented for today's appointment on time and was alert, oriented x5, with no evidence or self-report of active SI/HI or A/V H.  Travis [name] reported ongoing compliance with medication and denied any abuse of alcohol.  Travis [name] reported that he has continued smoking 1-2 joints of marijuana in the evening each day and denied any notable consequences.  Clinician inquired about Travis [name]'s current emotional ratings, as well as any significant changes in thoughts, feelings or behavior since last check-in.  Travis [name] reported scores of 0/10 for depression, 2/10 for anxiety, and 0/10 for anger/irritability.  Travis [name] denied any recent outbursts or panic attacks.  Travis [name] reported that a recent success has been feeling like the Buspirone he started has been helping with anxiety symptoms.  He reported that a struggle has been dealing with his relationship, as they had a disagreement recently and he asked to take the engagement ring back.  Clinician inquired about what triggered this disagreement, and any skills learned from therapy he attempted to utilize to resolve it.  [name]  reported that boundaries in the relationship have been causing him distress, as his fianc's mother is overinvolved in their lives, and now communicating with his own mother, who he has been limiting contact with.  Travis [name] reported that he has tried to communicate his feelings to his partner, but she "Gets overwhelmed and irritable easily".  He reported that this has him second guessing whether they should get married.  Clinician assisted Travis [name] in running a cost benefit analysis regarding whether to separate, or try to find another strategy to resolve ongoing issues with boundaries and communication in the relationship.  Travis [name] participated in analysis, and reported that he has hope that they can still make marriage work, but he needs her to put forth more effort to show that she cares about his feelings.  Clinician inquired about whether they have stayed consistent with couples counseling, or need to schedule a followup soon to ensure a safe space to bring up this problem with a professional mediating.  Travis [name] reported that they have attended a few more sessions, but would benefit from a followup.  He reported that he would like to bring up the issue tonight, but is afraid of this turning into another argument.  Clinician reviewed material with Travis [name] today on communication skills which could be utilized to increase understanding and support within the relationship.  Clinician presented a handout on 'soft startups' which offered suggestions on how Travis [name] could address a problem assertively with his partner, including tips such as choosing an appropriate time/setting, being mindful of maintaining gentle tone, volume and language, while avoiding triggering nonverbals such as rolling eyes, as well as utilizing "I" statements to express feelings, focusing on  one problem at a time, and being respectful.   Intervention was effective, as evidenced by Travis [name]'s active engagement in discussion on subject, and  receptiveness to suggestions offered in improving communication and setting new boundaries within the relationship, stating "I think we need to reevaluate things.  I've been prioritizing the relationship over myself for 6 months, and feel like she needs to work harder".  Clinician will continue to monitor.          Plan: Meet again in 1 month.   Diagnosis: Panic Disorder.  Collaboration of Care:   No collaboration of care required at this time.  Travis [name] was encouraged to stay in touch with his prescribing doctor about medication regimen.                                                   Patient/Guardian was advised Release of Information must be obtained prior to any record release in order to collaborate their care with an outside provider. Patient/Guardian was advised if they have not already done so to contact the registration department to sign all necessary forms in order for [name] to release information regarding their care.    Consent: Patient/Guardian gives verbal consent for treatment and assignment of benefits for services provided during this visit. Patient/Guardian expressed understanding and agreed to proceed.   Noralee Stain, [place], LCAS 08/13/23

## 2023-08-15 ENCOUNTER — Encounter: Payer: Self-pay | Admitting: Family Medicine

## 2023-08-15 ENCOUNTER — Ambulatory Visit (INDEPENDENT_AMBULATORY_CARE_PROVIDER_SITE_OTHER): Payer: 59 | Admitting: Family Medicine

## 2023-08-15 VITALS — BP 130/78 | HR 74 | Temp 98.0°F | Resp 16 | Ht 76.0 in | Wt 328.0 lb

## 2023-08-15 DIAGNOSIS — D489 Neoplasm of uncertain behavior, unspecified: Secondary | ICD-10-CM

## 2023-08-15 DIAGNOSIS — Z0001 Encounter for general adult medical examination with abnormal findings: Secondary | ICD-10-CM | POA: Diagnosis not present

## 2023-08-15 DIAGNOSIS — D235 Other benign neoplasm of skin of trunk: Secondary | ICD-10-CM | POA: Diagnosis not present

## 2023-08-15 DIAGNOSIS — Z0000 Encounter for general adult medical examination without abnormal findings: Secondary | ICD-10-CM

## 2023-08-15 LAB — CBC
HCT: 45.6 % (ref 39.0–52.0)
Hemoglobin: 15.4 g/dL (ref 13.0–17.0)
MCHC: 33.7 g/dL (ref 30.0–36.0)
MCV: 87.6 fL (ref 78.0–100.0)
Platelets: 233 10*3/uL (ref 150.0–400.0)
RBC: 5.21 Mil/uL (ref 4.22–5.81)
RDW: 13.2 % (ref 11.5–15.5)
WBC: 4.3 10*3/uL (ref 4.0–10.5)

## 2023-08-15 LAB — LIPID PANEL
Cholesterol: 209 mg/dL — ABNORMAL HIGH (ref 0–200)
HDL: 54.9 mg/dL (ref >39.00)
LDL Cholesterol: 141 mg/dL — ABNORMAL HIGH (ref 0–99)
NonHDL: 154.13
Total CHOL/HDL Ratio: 4
Triglycerides: 64 mg/dL (ref 0.0–149.0)
VLDL: 12.8 mg/dL (ref 0.0–40.0)

## 2023-08-15 LAB — COMPREHENSIVE METABOLIC PANEL
ALT: 27 U/L (ref 0–53)
AST: 17 U/L (ref 0–37)
Albumin: 4.7 g/dL (ref 3.5–5.2)
Alkaline Phosphatase: 43 U/L (ref 39–117)
BUN: 9 mg/dL (ref 6–23)
CO2: 29 meq/L (ref 19–32)
Calcium: 9.5 mg/dL (ref 8.4–10.5)
Chloride: 103 meq/L (ref 96–112)
Creatinine, Ser: 0.87 mg/dL (ref 0.40–1.50)
GFR: 110.38 mL/min (ref >60.00)
Glucose, Bld: 96 mg/dL (ref 70–99)
Potassium: 4.2 meq/L (ref 3.5–5.1)
Sodium: 140 meq/L (ref 135–145)
Total Bilirubin: 0.6 mg/dL (ref 0.2–1.2)
Total Protein: 7.1 g/dL (ref 6.0–8.3)

## 2023-08-15 NOTE — Patient Instructions (Addendum)
Give [name] 2-3 business days to get the results of your labs back.   Keep the diet clean and stay active.  Do monthly self testicular checks in the shower. You are feeling for lumps/bumps that don't belong. If you feel anything like this, let me know!  Do not shower for the rest of the day. When you do wash it, use only soap and water. Do not vigorously scrub. Apply triple antibiotic ointment (like Neosporin) twice daily. Keep the area clean and dry.   Things to look out for: increasing pain not relieved by ibuprofen/acetaminophen, fevers, spreading redness, drainage of pus, or foul odor.  Give [name] 1 week to get the results of your biopsy back.  Let us know if you need anything.

## 2023-08-15 NOTE — Progress Notes (Signed)
Chief Complaint  Patient presents with   Annual Exam    Annual Exam    Well Male Travis [name] is here for a complete physical.   His last physical was >1 year ago.  Current diet: in general, a "healthy" diet.   Current exercise: walking, hiking Weight trend: intentionally losing Fatigue out of ordinary? No. Seat belt? Yes.   Advanced directive? Yes  Health maintenance Tetanus- Yes HIV- Yes Hep C- Yes  Skin lesion The patient has a skin lesion on his upper back that has dark and hard.  It is becoming painful and he would like it removed and to see what it really is.  It is not growing in size or changing color.  It does not itch or drain.  He has not tried anything at home so far.  Past Medical History:  Diagnosis Date   ADD (attention deficit disorder)    Asthma    childhood   Asthma    Chest pain at rest 05/17/2020   Class 3 severe obesity with serious comorbidity and body mass index (BMI) of 45.0 to 49.9 in adult [facility]) 07/28/2019   Colon polyps    Essential hypertension 03/20/2020   Hip pain    HTN (hypertension)    Insulin resistance 05/25/2020   Intermittent palpitations 05/17/2020   Joint pain    Left hip pain 07/28/2019   Morbid obesity (HCC) 05/17/2020   Near syncope 05/17/2020   Other insomnia 04/03/2020   Overweight    Restless legs syndrome (RLS) 05/17/2020   Snoring 05/17/2020   Vitamin D deficiency 03/20/2020     Past Surgical History:  Procedure Laterality Date   arm Right    TONSILLECTOMY     WISDOM TOOTH EXTRACTION      Medications  Current Outpatient Medications on File Prior to Visit  Medication Sig Dispense Refill   clonazePAM (KLONOPIN) 1 MG tablet Take 1 mg by mouth daily.     levocetirizine (XYZAL) 5 MG tablet Take 1 tablet (5 mg total) by mouth every evening. 90 tablet 1   ondansetron (ZOFRAN-ODT) 4 MG disintegrating tablet Take 1 tablet (4 mg total) by mouth every 8 (eight) hours as needed for nausea or vomiting. 30 tablet 0   sildenafil  (VIAGRA) 100 MG tablet Take 0.5-1 tablets (50-100 mg total) by mouth daily as needed for erectile dysfunction. 30 tablet 2   venlafaxine XR (EFFEXOR-XR) 150 MG 24 hr capsule Take 150 mg by mouth daily with breakfast.     venlafaxine XR (EFFEXOR-XR) 75 MG 24 hr capsule Take 75 mg by mouth daily with breakfast.      Allergies No Known Allergies  Family History Family History  Problem Relation Age of Onset   Obesity Mother    Mental illness Mother    Depression Mother    Bipolar disorder Mother    Sleep apnea Mother    Alcoholism Mother    Eating disorder Mother    Irritable bowel syndrome Sister    Hearing loss Maternal Grandmother     Review of Systems: Constitutional: no fevers or chills Eye:  no recent significant change in vision Ear/Nose/Mouth/Throat:  Ears:  no hearing loss Nose/Mouth/Throat:  no complaints of nasal congestion, no sore throat Cardiovascular:  no chest pain Respiratory:  no shortness of breath Gastrointestinal:  no abdominal pain, no change in bowel habits GU:  Male: negative for dysuria Musculoskeletal/Extremities:  no pain of the joints Integumentary (Skin/Breast): As noted in HPI; otherwise no abnormal skin  lesions reported Neurologic:  no headaches Endocrine: No unexpected weight changes Hematologic/Lymphatic:  no night sweats  Exam BP 130/78 (BP Location: Left Arm, Patient Position: Sitting, Cuff Size: Normal)   Pulse 74   Temp 98 F (36.7 C) (Oral)   Resp 16   Ht 6\' 4"  (1.93 m)   Wt (!) 328 lb (148.8 kg)   SpO2 99%   BMI 39.93 kg/m  General:  well developed, well nourished, in no apparent distress Skin: Slightly raised and hyperpigmented circular lesion measuring 0.7 cm on his upper back region.  Mild TTP.  It does double.  Otherwise no significant moles, warts, or growths Head:  no masses, lesions, or tenderness Eyes:  pupils equal and round, sclera anicteric without injection Ears:  canals without lesions, TMs shiny without retraction,  no obvious effusion, no erythema Nose:  nares patent, mucosa normal Throat/Pharynx:  lips and gingiva without lesion; tongue and uvula midline; non-inflamed pharynx; no exudates or postnasal drainage Neck: neck supple without adenopathy, thyromegaly, or masses Lungs:  clear to auscultation, breath sounds equal bilaterally, no respiratory distress Cardio:  regular rate and rhythm, no bruits, no LE edema Abdomen:  abdomen soft, nontender; bowel sounds normal; no masses or organomegaly Genital (male): Deferred Rectal: Deferred Musculoskeletal:  symmetrical muscle groups noted without atrophy or deformity Extremities:  no clubbing, cyanosis, or edema, no deformities, no skin discoloration Neuro:  gait normal; deep tendon reflexes normal and symmetric Psych: well oriented with normal range of affect and appropriate judgment/insight  Procedure note; excisional biopsy Informed consent was obtained. The area was cleaned with alcohol and injected with 6 mL of 1% lidocaine with epinephrine. An elliptical track around the area of interest was demarcated. A sterile field was then created with a fenestrated drape. The area was then cleaned with Betadine. A 15 blade scalpel was then used to follow the elliptical track as demarcated. Forceps were used to create traction to enhance dissection of the tissue planes and removal of the specimen. The specimen was placed a sterile specimen cup and sent to the lab. 6 Simple 4-0 nylon sutures were placed with good approximation of wound edges. The area was dressed with triple antibiotic ointment and a bandage. There were no complications noted. The patient tolerated the procedure well.  Assessment and Plan  Well adult exam - Plan: CBC, Comprehensive metabolic panel, Lipid panel  Neoplasm of uncertain behavior - Plan: Surgical pathology( [place]/ [facility]), PR EXC B9 LESION MRGN XCP SK TG T/A/L 1.1-2.0 CM   Well 37 y.o. male. Counseled on diet and  exercise. Self testicular exams recommended at least monthly.  Skin lesion: Aftercare instructions verbalized and written down for the skin lesion.  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  Because of the pain, he wanted it removed.  Warning signs and symptoms verbalized and written down as well.  He will return in 10 days for suture removal. Other orders as above. Follow up in 1 year pending the above workup. The patient voiced understanding and agreement to the plan.  Jilda Roche [place], DO 08/15/23 12:13 PM

## 2023-08-19 LAB — SURGICAL PATHOLOGY

## 2023-08-25 ENCOUNTER — Ambulatory Visit: Payer: 59 | Admitting: Family Medicine

## 2023-09-02 DIAGNOSIS — F331 Major depressive disorder, recurrent, moderate: Secondary | ICD-10-CM | POA: Diagnosis not present

## 2023-09-02 DIAGNOSIS — F900 Attention-deficit hyperactivity disorder, predominantly inattentive type: Secondary | ICD-10-CM | POA: Diagnosis not present

## 2023-09-02 DIAGNOSIS — F410 Panic disorder [episodic paroxysmal anxiety] without agoraphobia: Secondary | ICD-10-CM | POA: Diagnosis not present

## 2023-09-10 ENCOUNTER — Ambulatory Visit (HOSPITAL_COMMUNITY): Payer: 59 | Admitting: Licensed Clinical Social Worker

## 2023-09-22 ENCOUNTER — Other Ambulatory Visit: Payer: Self-pay | Admitting: Family Medicine

## 2023-09-22 DIAGNOSIS — K635 Polyp of colon: Secondary | ICD-10-CM

## 2023-09-25 DIAGNOSIS — F331 Major depressive disorder, recurrent, moderate: Secondary | ICD-10-CM | POA: Diagnosis not present

## 2023-09-25 DIAGNOSIS — F900 Attention-deficit hyperactivity disorder, predominantly inattentive type: Secondary | ICD-10-CM | POA: Diagnosis not present

## 2023-09-25 DIAGNOSIS — F410 Panic disorder [episodic paroxysmal anxiety] without agoraphobia: Secondary | ICD-10-CM | POA: Diagnosis not present

## 2023-10-15 ENCOUNTER — Ambulatory Visit (INDEPENDENT_AMBULATORY_CARE_PROVIDER_SITE_OTHER): Payer: 59 | Admitting: Licensed Clinical Social Worker

## 2023-10-15 DIAGNOSIS — F900 Attention-deficit hyperactivity disorder, predominantly inattentive type: Secondary | ICD-10-CM | POA: Diagnosis not present

## 2023-10-15 DIAGNOSIS — F410 Panic disorder [episodic paroxysmal anxiety] without agoraphobia: Secondary | ICD-10-CM | POA: Diagnosis not present

## 2023-10-15 DIAGNOSIS — F331 Major depressive disorder, recurrent, moderate: Secondary | ICD-10-CM | POA: Diagnosis not present

## 2023-10-15 NOTE — Progress Notes (Signed)
 THERAPIST PROGRESS NOTE   Session Time: 2:00pm - 2:50pm   Location: [facility]: [facility] Therapist: [facility]   Participation Level: Active   Behavioral Response: Alert, casually dressed, neatly groomed, anxious mood/affect   Type of Therapy: Individual Therapy   Treatment Goals addressed: Coping with anxiety and managing panic attacks; Medication compliance; Monitoring substance use  Progress Towards Goals: Progressing   Interventions: CBT: psychoeducation on self-care    Summary: Travis [name] is a 38 year old male mixed race male that presented today for therapy appointment with diagnosis of Panic Disorder.       Suicidal/Homicidal: None; without intent or plan.    Therapist Response:  Clinician met with Travis [name] for in-person therapy appointment and assessed for medication compliance, safety, and sobriety.  Travis [name] presented for today's session on time and was alert, oriented x5, with no evidence or self-report of active SI/HI or A/V H.  Travis [name] reported ongoing compliance with medication and denied any abuse of alcohol.  Travis [name] reported that he has continued smoking 2-3 joints of marijuana in the evening each day and denied any notable consequences.  Clinician inquired about Travis [name]'s emotional ratings today, as well as any significant changes in thoughts, feelings or behavior since previous check-in.  Travis [name] reported scores of 0/10 for depression, 2/10 for anxiety, and 0/10 for anger/irritability.  Travis [name] denied any recent outbursts or panic attacks.  Travis [name] reported that a recent success has been continuing with school, as he is now taking 5 classes for the semester.  He reported that his fianc has also moved in with him since our last session, and they have continued working to strengthen the relationship.  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  Travis [name] stated "My anxiety has been up a bit more and my doctor put me on Gabapentin".  Clinician introduced topic of self-care today.  Clinician explained how this can be defined as the things one does to maintain good health and improve well-being.  Clinician provided Travis [name] with a self-care assessment form to complete.  This handout featured various sub-categories of self-care, including physical, psychological/emotional, social, spiritual, and professional.  Travis [name] was asked to rank his engagement in the activities listed for each dimension on a scale of 1-3, with 1 indicating 'Poor', 2 indicating 'Ok', and 3 indicating 'Well'.  Clinician invited Travis [name] to share results of his assessment, and inquired about which areas of self-care he is doing well in, as well as areas that require attention, and how he plans to begin addressing this during treatment.  Intervention was effective, as evidenced by Travis [name] successfully completing first section of assessment (physical) and actively engaging in discussion on subject, reporting that he is excelling in areas such as maintaining personal hygiene, and getting enough sleep at night, but would benefit from focusing more on areas such as exercising, dieting, participating in fun activities, and attending preventative medical appointments.  Travis [name] reported that he would work to improve self-care deficits by going on a fast for the next few weeks to lose weight gained from holiday diet, get out more often and take walks in the neighborhood to increase exercise, go hiking with his fianc more often, and schedule a followup with his PCP to talk about health goals.  Clinician will continue to monitor.          Plan: Meet again in  1 month.   Diagnosis: Panic Disorder.  Collaboration of Care:   No collaboration of care required at this time.  Travis [name] was encouraged to stay in touch with his prescribing doctor about medication regimen.                                                    Patient/Guardian was advised Release of Information must be obtained prior to any record release in order to collaborate their care with an outside provider. Patient/Guardian was advised if they have not already done so to contact the registration department to sign all necessary forms in order for us  to release information regarding their care.    Consent: Patient/Guardian gives verbal consent for treatment and assignment of benefits for services provided during this visit. Patient/Guardian expressed understanding and agreed to proceed.   Darleene Ricker, LCSW, LCAS 10/15/23

## 2023-10-21 DIAGNOSIS — B300 Keratoconjunctivitis due to adenovirus: Secondary | ICD-10-CM | POA: Diagnosis not present

## 2023-10-27 DIAGNOSIS — F410 Panic disorder [episodic paroxysmal anxiety] without agoraphobia: Secondary | ICD-10-CM | POA: Diagnosis not present

## 2023-10-27 DIAGNOSIS — F331 Major depressive disorder, recurrent, moderate: Secondary | ICD-10-CM | POA: Diagnosis not present

## 2023-10-27 DIAGNOSIS — F900 Attention-deficit hyperactivity disorder, predominantly inattentive type: Secondary | ICD-10-CM | POA: Diagnosis not present

## 2023-11-08 ENCOUNTER — Other Ambulatory Visit: Payer: Self-pay | Admitting: Family Medicine

## 2023-11-08 MED ORDER — OSELTAMIVIR PHOSPHATE 75 MG PO CAPS: 75.0000 mg | ORAL_CAPSULE | Freq: Two times a day (BID) | ORAL | 0 refills | Status: AC

## 2023-11-12 ENCOUNTER — Ambulatory Visit (INDEPENDENT_AMBULATORY_CARE_PROVIDER_SITE_OTHER): Payer: 59 | Admitting: Licensed Clinical Social Worker

## 2023-11-12 DIAGNOSIS — F410 Panic disorder [episodic paroxysmal anxiety] without agoraphobia: Secondary | ICD-10-CM

## 2023-11-12 NOTE — Progress Notes (Signed)
 Virtual Visit via Video Note   I connected with Travis [name] on 11/12/23 at 2:00pm by video enabled telemedicine application and verified that I am speaking with the correct person using two identifiers.   I discussed the limitations, risks, security and privacy concerns of performing an evaluation and management service by video and the availability of in person appointments. I also discussed with the patient that there may be a patient responsible charge related to this service. The patient expressed understanding and agreed to proceed.   I discussed the assessment and treatment plan with the patient. The patient was provided an opportunity to ask questions and all were answered. The patient agreed with the plan and demonstrated an understanding of the instructions.   The patient was advised to call back or seek an in-person evaluation if the symptoms worsen or if the condition fails to improve as anticipated.   I provided 1 hour of non-face-to-face time during this encounter.     Travis Ricker, LCSW, LCAS ____________________________  THERAPIST PROGRESS NOTE   Session Time: 2:00pm - 3:00pm   Location: Patient: Patient home [facility]rapist: [facility]   Participation Level: Active   Behavioral Response: Alert, casually dressed, neatly groomed, anxious mood/affect   Type of Therapy: Individual Therapy   Treatment Goals addressed: Coping with anxiety and managing panic attacks; Medication compliance; Monitoring substance use; Documenting panic episodes; Improving communication skills and boundaries  Progress Towards Goals: Progressing   Interventions: CBT, crisis planning, psychoeducation on healthy relationships, communication skills    Summary: Travis [name] is a 38 year old male mixed race male that presented today for therapy appointment with diagnosis of Panic Disorder.       Suicidal/Homicidal: None; without intent or plan.    Therapist Response:  Clinician met with Travis  for virtual therapy session and assessed for medication compliance, safety, and sobriety.  [name] presented for today's appointment on time and was alert, oriented x5, with no evidence or self-report of active SI/HI or A/V H.  Travis [name] reported ongoing compliance with medication and denied any abuse of alcohol.  Travis [name] reported that he has abstained from smoking marijuana for the past week.  Clinician inquired about Travis [name]'s current emotional ratings, as well as any significant changes in thoughts, feelings or behavior since last check-in.  Travis [name] reported scores of 1/10 for depression, 2/10 for anxiety, and 0/10 for anger/irritability.  Travis [name] denied any recent outbursts.  Travis [name] reported that a recent struggle was having a panic attack shortly after our last session, stating I didn't see it coming along.  Clinician inquired about any notable triggers which may have contributed to this crisis event, as well as any coping strategies Caiden might have implemented to intervene/deescalate.  Travis [name] reported that he was talking to his fiance on the phone, and they were arguing.  He reported that he began to feel tense, and shake in his body, so he ended the conversation and took a timeout.  Travis [name] reported that he took a Klonopin , laid in his bed, put on relaxing music and focused on deep breathing until he felt calm again. Travis [name] reported that he attempted to talk to her again at a later time and resolve their issues, but this triggered him again, so he asked to to move out.  Travis [name] reported that recent issues in the relationship have made him reconsider the engagement, and whether they are actually a good match for marriage.  Clinician reviewed psychoeducation on traits of healthy relationships using a handout  to guide discussion with [name] on the subject.  This handout explained how attachments to connections within one's support network (i.e. friends, family, romantic partners, coworkers, catering manager) can  influence one's mental health, and emphasized importance of looking for green lights (positive behaviors) that can be considered normal, as well as red lights (harmful behaviors) which suggest that a boundary should be established. Travis [name] was tasked with identifying green lights (i.e. respect, trust, appreciation, healthy conflict resolution, etc) and red lights (i.e. contempt, suspicion, impatience, lack of growth, etc) that were present in previous relationship in order to help him determine what (if any) role he played in recent relationship strife, and assist in gauging quality of future relationships.  Intervention was effective, as evidenced by Travis actively participating in discussion on subject, and identifying several unhealthy traits that his fianc has displayed over the course of their relationship, but he may have been overlooking.  Travis [name] reported that he has attempted to be open and honest with his partner to express concerns for her recent choices and behavior, but she is a poor communicator and can be contemptuous, which can trigger his anger and cause him to regress to aggressive speech.  He reported that he does not see growth in the relationship, and has encouraged them to go back to marriage counseling, but she is resistant, stating "The intention is there, but no follow through.  It really sucks".  Travis [name] reported that based upon these warning signs and negative impact upon his wellbeing, he believes they should take a temporary break.  Travis [name] stated I can't go back into the relationship if she isn't willing to talk.  Clinician will continue to monitor.          Plan: Meet again in 1 month.   Diagnosis: Panic Disorder.  Collaboration of Care:   No collaboration of care required at this time.  Travis [name] was encouraged to stay in touch with his prescribing doctor about medication regimen.                                                   Patient/Guardian was advised Release of  Information must be obtained prior to any record release in order to collaborate their care with an outside provider. Patient/Guardian was advised if they have not already done so to contact the registration department to sign all necessary forms in order for us  to release information regarding their care.    Consent: Patient/Guardian gives verbal consent for treatment and assignment of benefits for services provided during this visit. Patient/Guardian expressed understanding and agreed to proceed.   Travis Ricker, LCSW, LCAS 11/12/23

## 2023-11-24 DIAGNOSIS — F410 Panic disorder [episodic paroxysmal anxiety] without agoraphobia: Secondary | ICD-10-CM | POA: Diagnosis not present

## 2023-11-24 DIAGNOSIS — F331 Major depressive disorder, recurrent, moderate: Secondary | ICD-10-CM | POA: Diagnosis not present

## 2023-11-24 DIAGNOSIS — F900 Attention-deficit hyperactivity disorder, predominantly inattentive type: Secondary | ICD-10-CM | POA: Diagnosis not present

## 2023-12-03 ENCOUNTER — Ambulatory Visit (INDEPENDENT_AMBULATORY_CARE_PROVIDER_SITE_OTHER): Payer: 59 | Admitting: Licensed Clinical Social Worker

## 2023-12-03 DIAGNOSIS — F410 Panic disorder [episodic paroxysmal anxiety] without agoraphobia: Secondary | ICD-10-CM
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
## 2023-12-10 ENCOUNTER — Ambulatory Visit (HOSPITAL_COMMUNITY): Payer: 59 | Admitting: Licensed Clinical Social Worker

## 2024-01-07 ENCOUNTER — Ambulatory Visit (INDEPENDENT_AMBULATORY_CARE_PROVIDER_SITE_OTHER): Payer: 59 | Admitting: Licensed Clinical Social Worker

## 2024-01-07 DIAGNOSIS — F410 Panic disorder [episodic paroxysmal anxiety] without agoraphobia: Secondary | ICD-10-CM
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
## 2024-01-12 ENCOUNTER — Other Ambulatory Visit: Payer: Self-pay | Admitting: Family Medicine

## 2024-01-12 DIAGNOSIS — Z113 Encounter for screening for infections with a predominantly sexual mode of transmission: Secondary | ICD-10-CM

## 2024-01-12 DIAGNOSIS — Z114 Encounter for screening for human immunodeficiency virus [HIV]: Secondary | ICD-10-CM

## 2024-01-13 ENCOUNTER — Other Ambulatory Visit (INDEPENDENT_AMBULATORY_CARE_PROVIDER_SITE_OTHER)

## 2024-01-13 ENCOUNTER — Other Ambulatory Visit (HOSPITAL_COMMUNITY)
Admission: RE | Admit: 2024-01-13 | Discharge: 2024-01-13 | Disposition: A | Discharge status: Home / Self Care | Source: Ambulatory Visit | Attending: Family Medicine | Admitting: Family Medicine

## 2024-01-13 DIAGNOSIS — Z113 Encounter for screening for infections with a predominantly sexual mode of transmission: Secondary | ICD-10-CM

## 2024-01-13 DIAGNOSIS — Z114 Encounter for screening for human immunodeficiency virus [HIV]: Secondary | ICD-10-CM | POA: Diagnosis not present

## 2024-01-14 ENCOUNTER — Encounter: Payer: Self-pay | Admitting: Family Medicine

## 2024-01-14 LAB — URINE CYTOLOGY ANCILLARY ONLY
Chlamydia: NEGATIVE
Comment: NEGATIVE
Comment: NEGATIVE
Comment: NORMAL
Neisseria Gonorrhea: NEGATIVE
Trichomonas: NEGATIVE

## 2024-01-14 LAB — HIV ANTIBODY (ROUTINE TESTING W REFLEX): HIV 1&2 Ab, 4th Generation: NONREACTIVE

## 2024-01-21 ENCOUNTER — Ambulatory Visit (INDEPENDENT_AMBULATORY_CARE_PROVIDER_SITE_OTHER): Admitting: Licensed Clinical Social Worker

## 2024-01-21 DIAGNOSIS — F410 Panic disorder [episodic paroxysmal anxiety] without agoraphobia: Secondary | ICD-10-CM
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
## 2024-02-16 DIAGNOSIS — F331 Major depressive disorder, recurrent, moderate: Secondary | ICD-10-CM | POA: Diagnosis not present

## 2024-02-16 DIAGNOSIS — F900 Attention-deficit hyperactivity disorder, predominantly inattentive type: Secondary | ICD-10-CM | POA: Diagnosis not present

## 2024-02-16 DIAGNOSIS — F410 Panic disorder [episodic paroxysmal anxiety] without agoraphobia: Secondary | ICD-10-CM | POA: Diagnosis not present

## 2024-02-19 ENCOUNTER — Ambulatory Visit (INDEPENDENT_AMBULATORY_CARE_PROVIDER_SITE_OTHER): Admitting: Licensed Clinical Social Worker

## 2024-02-19 DIAGNOSIS — F410 Panic disorder [episodic paroxysmal anxiety] without agoraphobia: Secondary | ICD-10-CM

## 2024-02-19 NOTE — Progress Notes (Signed)
 Virtual Visit via Video Note  I connected with Travis [name] on 02/19/24 at 2:05pm by video enabled telemedicine application and verified that I am speaking with the correct person using two identifiers.   I discussed the limitations, risks, security and privacy concerns of performing an evaluation and management service by video and the availability of in person appointments. I also discussed with the patient that there may be a patient responsible charge related to this service. The patient expressed understanding and agreed to proceed.   I discussed the assessment and treatment plan with the patient. The patient was provided an opportunity to ask questions and all were answered. The patient agreed with the plan and demonstrated an understanding of the instructions.   The patient was advised to call back or seek an in-person evaluation if the symptoms worsen or if the condition fails to improve as anticipated.   I provided 46 minutes of non-face-to-face time during this encounter.   Desmond Florida, LCSW, LCAS ________________________ THERAPIST PROGRESS NOTE   Session Time: 2:05pm - 2:51pm   Location: Patient: Patient Home Therapist: [facility]   Participation Level: Active   Behavioral Response: Alert, casually dressed, neatly groomed, euthymic mood/affect   Type of Therapy: Individual Therapy   Treatment Goals addressed: Coping with anxiety and managing panic attacks; Medication compliance; Monitoring substance use  Progress Towards Goals: Progressing   Interventions: CBT: self-care assessment    Summary: Travis [name] is a 38 year old male mixed race male that presented today for therapy appointment with diagnosis of Panic Disorder.       Suicidal/Homicidal: None; without intent or plan.    Therapist Response:  Clinician met with Travis [name] for virtual therapy session and assessed for medication compliance, safety, and sobriety.  Travis [name] presented for today's appointment 5  minutes late, but was alert, oriented x5, with no evidence or self-report of active SI/HI or A/V H.  Travis [name] reported ongoing compliance with medication and denied any abuse of alcohol.  Travis [name] reported that he has been smoking marijuana 3-4x per week.  He denied any negative consequences related to marijuana use at this time.  Clinician inquired about Travis [name]'s current emotional ratings, as well as any significant changes in thoughts, feelings or behavior since last check-in.  Travis [name] reported scores of 1/10 for depression, 2/10 for anxiety, and 0/10 for anger/irritability.  Travis [name] denied any recent outbursts or panic attacks.  Travis [name] reported that a recent success has been coping better with the loss of his relationship by focusing more on school and travelling.  He stated "Its nice to see the finish line.  I'm trying to put that relationship out of my mind".  Clinician continued discussion topic of self-care today.  Clinician reminded Travis [name] that self-care can be defined as the things one does to maintain good health and improve well-being.  Clinician provided Travis [name] with a self-care assessment form to complete.  This handout featured various sub-categories of self-care, including physical, psychological/emotional, social, spiritual, and professional.  Travis [name] was asked to rank his engagement in the activities listed for each dimension on a scale of 1-3, with 1 indicating 'Poor', 2 indicating 'Ok', and 3 indicating 'Well'.  Clinician invited Travis [name] to share results of his assessment, and inquired about which areas of self-care he is doing well in, as well as areas that require attention, and how he plans to begin addressing this during treatment.  Intervention was effective, as evidenced by Travis [name] successfully completing 2nd section (psychological/emotional) of assessment and actively  engaging in discussion on subject, reporting that he is excelling in areas such as taking vacations and daytrips, getting  away from distractions like his phone/email, and talking to supports about his problems, but would benefit from focusing more on areas such as taking time off from work and other obligations, participation in hobbies, learning new things unrelated to work, expressing his feelings in healthy ways, and recognizing his own strengths and achievements.  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  Travis [name] stated "I need to give myself more credit for those 'small wins'".  Clinician will continue to monitor.          Plan: Meet again in 1 month.   Diagnosis: Panic Disorder.  Collaboration of Care:   No collaboration of care required at this time.  Travis [name] was encouraged to stay in touch with his prescribing doctor about medication regimen.                                                   Patient/Guardian was advised Release of Information must be obtained prior to any record release in order to collaborate their care with an outside provider. Patient/Guardian was advised if they have not already done so to contact the registration department to sign all necessary forms in order for us  to release information regarding their care.    Consent: Patient/Guardian gives verbal consent for treatment and assignment of benefits for services provided during this visit. Patient/Guardian expressed understanding and agreed to proceed.   Desmond Florida, LCSW, LCAS 02/19/24

## 2024-03-02 ENCOUNTER — Encounter (HOSPITAL_COMMUNITY): Payer: Self-pay

## 2024-03-02 ENCOUNTER — Ambulatory Visit (HOSPITAL_COMMUNITY): Admitting: Licensed Clinical Social Worker

## 2024-03-11 ENCOUNTER — Ambulatory Visit (HOSPITAL_COMMUNITY): Admitting: Licensed Clinical Social Worker

## 2024-03-15 DIAGNOSIS — Z860101 Personal history of adenomatous and serrated colon polyps: Secondary | ICD-10-CM | POA: Diagnosis not present

## 2024-03-15 DIAGNOSIS — R1084 Generalized abdominal pain: Secondary | ICD-10-CM | POA: Diagnosis not present

## 2024-03-18 ENCOUNTER — Ambulatory Visit (HOSPITAL_COMMUNITY): Admitting: Licensed Clinical Social Worker

## 2024-03-18 DIAGNOSIS — K2970 Gastritis, unspecified, without bleeding: Secondary | ICD-10-CM | POA: Diagnosis not present

## 2024-03-18 DIAGNOSIS — K2930 Chronic superficial gastritis without bleeding: Secondary | ICD-10-CM | POA: Diagnosis not present

## 2024-03-18 DIAGNOSIS — K3189 Other diseases of stomach and duodenum: Secondary | ICD-10-CM | POA: Diagnosis not present

## 2024-03-18 DIAGNOSIS — R1084 Generalized abdominal pain: Secondary | ICD-10-CM | POA: Diagnosis not present

## 2024-03-18 DIAGNOSIS — Z09 Encounter for follow-up examination after completed treatment for conditions other than malignant neoplasm: Secondary | ICD-10-CM | POA: Diagnosis not present

## 2024-03-18 DIAGNOSIS — Z860100 Personal history of colon polyps, unspecified: Secondary | ICD-10-CM | POA: Diagnosis not present

## 2024-03-18 DIAGNOSIS — D124 Benign neoplasm of descending colon: Secondary | ICD-10-CM | POA: Diagnosis not present

## 2024-03-18 LAB — HM COLONOSCOPY

## 2024-03-25 ENCOUNTER — Ambulatory Visit (INDEPENDENT_AMBULATORY_CARE_PROVIDER_SITE_OTHER): Admitting: Licensed Clinical Social Worker

## 2024-03-25 DIAGNOSIS — F410 Panic disorder [episodic paroxysmal anxiety] without agoraphobia: Secondary | ICD-10-CM | POA: Diagnosis not present
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
## 2024-04-01 ENCOUNTER — Ambulatory Visit (HOSPITAL_COMMUNITY): Admitting: Licensed Clinical Social Worker

## 2024-04-26 ENCOUNTER — Ambulatory Visit (INDEPENDENT_AMBULATORY_CARE_PROVIDER_SITE_OTHER): Admitting: Licensed Clinical Social Worker

## 2024-04-26 DIAGNOSIS — F410 Panic disorder [episodic paroxysmal anxiety] without agoraphobia: Secondary | ICD-10-CM | POA: Diagnosis not present

## 2024-04-26 NOTE — Progress Notes (Addendum)
 Virtual Visit via Video Note   I connected with Travis [name] on 04/26/24 at 2:06pm by video enabled telemedicine application and verified that I am speaking with the correct person using two identifiers.   I discussed the limitations, risks, security and privacy concerns of performing an evaluation and management service by video and the availability of in person appointments. I also discussed with the patient that there may be a patient responsible charge related to this service. The patient expressed understanding and agreed to proceed.   I discussed the assessment and treatment plan with the patient. The patient was provided an opportunity to ask questions and all were answered. The patient agreed with the plan and demonstrated an understanding of the instructions.   The patient was advised to call back or seek an in-person evaluation if the symptoms worsen or if the condition fails to improve as anticipated.   I provided 50 minutes of non-face-to-face time during this encounter.     Darleene Ricker, LCSW, LCAS ________________________________ THERAPIST PROGRESS NOTE   Session Time: 2:06pm - 2:56pm   Location: Patient: Patient Home Therapist: [facility]   Participation Level: Active   Behavioral Response: Alert, casually dressed, neatly groomed, euthymic mood/affect   Type of Therapy: Individual Therapy   Treatment Goals addressed: Coping with anxiety and managing panic attacks; Medication compliance; Monitoring substance use; Completing college program   Progress Towards Goals: Progressing   Interventions: CBT, problem solving, time management skills    Summary: Travis [name] is a 38 year old male mixed race male that presented today for therapy appointment with diagnosis of Panic Disorder.       Suicidal/Homicidal: None; without intent or plan.    Therapist Response:  Clinician met with Travis for virtual therapy appointment and assessed for medication compliance,  safety, and sobriety.  Travis [name] presented for today's session 6 minutes late due to technical issues joining the session, but was alert, oriented x5, with no evidence or self-report of active SI/HI or A/V H.  Travis [name] reported ongoing compliance with medication and denied any abuse of alcohol.  Travis [name] reported that he has been smoking marijuana 1-2 'joints' per day.  He denied any negative consequences related to marijuana use at this time.  Clinician inquired about Travis [name]'s emotional ratings today, as well as any significant changes in thoughts, feelings or behavior since previous check-in.  Travis [name] reported scores of 0/10 for depression, 2/10 for anxiety, and 0/10 for anger/irritability.  Travis [name] denied any recent outbursts or panic attacks.  Travis [name] reported that a recent success was completing school, stating I'm very glad its finished.  That's a big relief.  He reported that anxiety is lower now, and he has been able to engage in more self-care activities like kite surfing, which is a new hobby for him.  He reported that one issue has been managing his time now that he doesn't have school to worry about, but shift is focusing to new job opportunities.  Clinician discussed topic of time management today in order to help Travis [name] with skill building and stress management.  Clinician utilized a handout to guide discussion, which defined time management as the ability to use one's time to effectively and productively.  Several strategies were covered to assist Travis [name], including using a to-do list and/or calendar to break down tasks and place them in time slots, set reminders via post-it notes or electronic devices to aid memory, prioritize tasks over a period of time to cut down on stress, create  realistic timelines for tasks to place in calendar, set SMART (specific, measurable, achievable, relevant, time bound) goals, talk to positive supports for accountability and assistance with intimidating goals, and  ensure time for self-care to reward hard work.  Intervention was effective, as evidenced by Travis actively engaging in discussion on subject, reporting that in school there was little talk of time management and his grades suffered as a result. He reported that it would benefit him to get back into a regular routine, prioritize important tasks such as hobbies, relationships, and rest.  Travis [name] reported that now that he is out of school, he can put around 9-10 hours per week back into these activities, but he will need to set firmer social media boundaries to avoid distraction.  He reported that he will also try to avoid procrastinating as much, since this has caused problems in the past.  Travis [name] stated I think sometimes I prioritize other things and I have a lot to catch up on.  Clinician will continue to monitor.          Plan: Meet again in 1 month.   Diagnosis: Panic Disorder.  Collaboration of Care:   No collaboration of care required at this time.  Travis [name] was encouraged to stay in touch with his prescribing doctor about medication regimen.                                                   Patient/Guardian was advised Release of Information must be obtained prior to any record release in order to collaborate their care with an outside provider. Patient/Guardian was advised if they have not already done so to contact the registration department to sign all necessary forms in order for us  to release information regarding their care.    Consent: Patient/Guardian gives verbal consent for treatment and assignment of benefits for services provided during this visit. Patient/Guardian expressed understanding and agreed to proceed.   Darleene Ricker, LCSW, LCAS 04/26/24

## 2024-05-10 DIAGNOSIS — F331 Major depressive disorder, recurrent, moderate: Secondary | ICD-10-CM | POA: Diagnosis not present

## 2024-05-10 DIAGNOSIS — F410 Panic disorder [episodic paroxysmal anxiety] without agoraphobia: Secondary | ICD-10-CM | POA: Diagnosis not present

## 2024-05-10 DIAGNOSIS — F900 Attention-deficit hyperactivity disorder, predominantly inattentive type: Secondary | ICD-10-CM | POA: Diagnosis not present

## 2024-05-25 ENCOUNTER — Telehealth (INDEPENDENT_AMBULATORY_CARE_PROVIDER_SITE_OTHER): Payer: Self-pay | Admitting: Licensed Clinical Social Worker

## 2024-05-25 ENCOUNTER — Ambulatory Visit (HOSPITAL_COMMUNITY): Payer: Self-pay | Admitting: Licensed Clinical Social Worker

## 2024-05-25 ENCOUNTER — Encounter (HOSPITAL_COMMUNITY): Payer: Self-pay

## 2024-05-25 DIAGNOSIS — Z91199 Patient's noncompliance with other medical treatment and regimen due to unspecified reason: Secondary | ICD-10-CM

## 2024-05-25 NOTE — Telephone Encounter (Signed)
 Ignazio had a virtual session scheduled today for 1pm.  Talon did not present on time for appointment.  Clinician was informed by front desk staff at 1:08pm that Vannie had called to reported that he was currently [place] of the country, and could not attend session, so this would be billed as no-show event.   Darleene Ricker, LCSW, LCAS 05/25/24

## 2024-06-22 ENCOUNTER — Encounter (HOSPITAL_COMMUNITY): Payer: Self-pay

## 2024-06-22 ENCOUNTER — Ambulatory Visit (HOSPITAL_COMMUNITY): Admitting: Licensed Clinical Social Worker

## 2024-06-22 ENCOUNTER — Telehealth (INDEPENDENT_AMBULATORY_CARE_PROVIDER_SITE_OTHER): Admitting: Licensed Clinical Social Worker

## 2024-06-22 NOTE — Telephone Encounter (Signed)
 Dhani had a virtual session scheduled today for 1pm.  Clinician joan Curtistine by phone at 1:08pm when he did not appear on time.  Sachin did not answer this phone call, so a voicemail was left reminding him of session, and callback number was provided.  Clinician informed front desk staff of no-show event when Stephan did not present online or outreach office by 1:15pm.   Darleene Ricker, LCSW, LCAS 06/22/24

## 2024-07-20 ENCOUNTER — Ambulatory Visit (HOSPITAL_COMMUNITY): Admitting: Licensed Clinical Social Worker

## 2024-08-02 DIAGNOSIS — F900 Attention-deficit hyperactivity disorder, predominantly inattentive type: Secondary | ICD-10-CM | POA: Diagnosis not present

## 2024-08-02 DIAGNOSIS — F410 Panic disorder [episodic paroxysmal anxiety] without agoraphobia: Secondary | ICD-10-CM | POA: Diagnosis not present

## 2024-08-02 DIAGNOSIS — F331 Major depressive disorder, recurrent, moderate: Secondary | ICD-10-CM | POA: Diagnosis not present

## 2024-08-16 ENCOUNTER — Encounter: Payer: 59 | Admitting: Family Medicine

## 2024-08-17 ENCOUNTER — Telehealth (INDEPENDENT_AMBULATORY_CARE_PROVIDER_SITE_OTHER): Payer: Self-pay | Admitting: Licensed Clinical Social Worker

## 2024-08-17 ENCOUNTER — Encounter (HOSPITAL_COMMUNITY): Payer: Self-pay

## 2024-08-17 ENCOUNTER — Ambulatory Visit (HOSPITAL_COMMUNITY): Payer: Self-pay | Admitting: Licensed Clinical Social Worker

## 2024-08-17 NOTE — Telephone Encounter (Signed)
 Doral had a virtual appointment scheduled today for 1pm.  Clinician joan Curtistine by phone at 1:08pm when he did not present on time, despite email and text reminders that were sent prior.  Clinician received a busy signal and could not leave a message.  This is Travis [name]'s 3rd consecutive no show event, and he has not attended a session since 04/26/24, so he will be discharged from this provider's care as a result of noncompliance with treatment.  Clinician informed front desk staff of no-show event and need to discharge client when Lyndel did not present for session by 1:15pm.    Darleene Ricker, LCSW, LCAS 08/17/24

## 2024-09-13 ENCOUNTER — Encounter (HOSPITAL_COMMUNITY): Payer: Self-pay | Admitting: Licensed Clinical Social Worker

## 2024-09-13 ENCOUNTER — Telehealth (HOSPITAL_COMMUNITY): Payer: Self-pay | Admitting: Licensed Clinical Social Worker

## 2024-09-13 NOTE — Telephone Encounter (Signed)
 Per provider, patient has been discharged due to multiple missed appointments. Dismissal letter has been seen via mychart and certified mail on 09/13/2024.

## 2024-09-14 ENCOUNTER — Ambulatory Visit (HOSPITAL_COMMUNITY): Admitting: Licensed Clinical Social Worker

## 2024-09-27 ENCOUNTER — Ambulatory Visit (HOSPITAL_COMMUNITY): Admitting: Licensed Clinical Social Worker

## 2024-10-11 ENCOUNTER — Ambulatory Visit: Payer: Self-pay | Admitting: Family Medicine

## 2024-10-11 ENCOUNTER — Encounter: Payer: Self-pay | Admitting: Family Medicine

## 2024-10-11 ENCOUNTER — Ambulatory Visit (INDEPENDENT_AMBULATORY_CARE_PROVIDER_SITE_OTHER): Payer: Self-pay | Admitting: Family Medicine

## 2024-10-11 VITALS — BP 132/74 | HR 71 | Temp 98.0°F | Resp 16 | Ht 76.0 in | Wt 316.8 lb

## 2024-10-11 DIAGNOSIS — Z0000 Encounter for general adult medical examination without abnormal findings: Secondary | ICD-10-CM | POA: Diagnosis not present

## 2024-10-11 DIAGNOSIS — Z114 Encounter for screening for human immunodeficiency virus [HIV]: Secondary | ICD-10-CM | POA: Diagnosis not present

## 2024-10-11 DIAGNOSIS — Z113 Encounter for screening for infections with a predominantly sexual mode of transmission: Secondary | ICD-10-CM | POA: Diagnosis not present

## 2024-10-11 LAB — COMPREHENSIVE METABOLIC PANEL WITH GFR
ALT: 33 U/L (ref 3–53)
AST: 19 U/L (ref 5–37)
Albumin: 4.5 g/dL (ref 3.5–5.2)
Alkaline Phosphatase: 42 U/L (ref 39–117)
BUN: 9 mg/dL (ref 6–23)
CO2: 28 meq/L (ref 19–32)
Calcium: 9.3 mg/dL (ref 8.4–10.5)
Chloride: 104 meq/L (ref 96–112)
Creatinine, Ser: 0.72 mg/dL (ref 0.40–1.50)
GFR: 115.92 mL/min
Glucose, Bld: 93 mg/dL (ref 70–99)
Potassium: 4.3 meq/L (ref 3.5–5.1)
Sodium: 139 meq/L (ref 135–145)
Total Bilirubin: 0.6 mg/dL (ref 0.2–1.2)
Total Protein: 7 g/dL (ref 6.0–8.3)

## 2024-10-11 LAB — LIPID PANEL
Cholesterol: 193 mg/dL (ref 28–200)
HDL: 56.8 mg/dL
LDL Cholesterol: 124 mg/dL — ABNORMAL HIGH (ref 10–99)
NonHDL: 136.39
Total CHOL/HDL Ratio: 3
Triglycerides: 63 mg/dL (ref 10.0–149.0)
VLDL: 12.6 mg/dL (ref 0.0–40.0)

## 2024-10-11 LAB — VITAMIN B12: Vitamin B-12: 880 pg/mL (ref 211–911)

## 2024-10-11 LAB — CBC
HCT: 43.4 % (ref 39.0–52.0)
Hemoglobin: 14.6 g/dL (ref 13.0–17.0)
MCHC: 33.7 g/dL (ref 30.0–36.0)
MCV: 86 fl (ref 78.0–100.0)
Platelets: 211 K/uL (ref 150.0–400.0)
RBC: 5.05 Mil/uL (ref 4.22–5.81)
RDW: 14 % (ref 11.5–15.5)
WBC: 4.7 K/uL (ref 4.0–10.5)

## 2024-10-11 LAB — TSH: TSH: 1.36 u[IU]/mL (ref 0.35–5.50)

## 2024-10-11 LAB — TESTOSTERONE: Testosterone: 444.47 ng/dL (ref 300.00–890.00)

## 2024-10-11 LAB — VITAMIN D 25 HYDROXY (VIT D DEFICIENCY, FRACTURES): VITD: 28.62 ng/mL — ABNORMAL LOW (ref 30.00–100.00)

## 2024-10-11 NOTE — Progress Notes (Signed)
 Chief Complaint  Patient presents with   Annual Exam    CPE    Well Male Travis [name] is here for a complete physical.   His last physical was >1 year ago.  Current diet: in general, a healthy diet.   Current exercise: swimming, walking Weight trend: intentionally losing Fatigue out of ordinary? No. Seat belt? Mostly Advanced directive? Yes  Health maintenance Tetanus- Yes HIV- Yes Hep C- Yes  Past Medical History:  Diagnosis Date   ADD (attention deficit disorder)    Asthma    childhood   Asthma    Chest pain at rest 05/17/2020   Class 3 severe obesity with serious comorbidity and body mass index (BMI) of 45.0 to 49.9 in adult [facility]) 07/28/2019   Colon polyps    Essential hypertension 03/20/2020   Hip pain    HTN (hypertension)    Insulin resistance 05/25/2020   Intermittent palpitations 05/17/2020   Joint pain    Left hip pain 07/28/2019   Morbid obesity (HCC) 05/17/2020   Near syncope 05/17/2020   Other insomnia 04/03/2020   Overweight    Restless legs syndrome (RLS) 05/17/2020   Snoring 05/17/2020   Vitamin D  deficiency 03/20/2020     Past Surgical History:  Procedure Laterality Date   arm Right    TONSILLECTOMY     WISDOM TOOTH EXTRACTION      Medications  Medications Ordered Prior to Encounter[1]   Allergies Allergies[2]  Family History Family History  Problem Relation Age of Onset   Obesity Mother    Mental illness Mother    Depression Mother    Bipolar disorder Mother    Sleep apnea Mother    Alcoholism Mother    Eating disorder Mother    Irritable bowel syndrome Sister    Hearing loss Maternal Grandmother     Review of Systems: Constitutional: no fevers or chills Eye:  no recent significant change in vision Ear/Nose/Mouth/Throat:  Ears:  no hearing loss Nose/Mouth/Throat:  no complaints of nasal congestion, no sore throat Cardiovascular:  no chest pain Respiratory:  no shortness of breath Gastrointestinal:  no abdominal pain, no change  in bowel habits GU:  Male: negative for dysuria Musculoskeletal/Extremities:  no pain of the joints Integumentary (Skin/Breast):  no abnormal skin lesions reported Neurologic:  no headaches Endocrine: No unexpected weight changes Hematologic/Lymphatic:  no night sweats  Exam BP 132/74 (BP Location: Left Arm, Patient Position: Sitting)   Pulse 71   Temp 98 F (36.7 C) (Oral)   Resp 16   Ht 6' 4 (1.93 m)   Wt (!) 316 lb 12.8 oz (143.7 kg)   SpO2 97%   BMI 38.56 kg/m  General:  well developed, well nourished, in no apparent distress Skin:  no significant moles, warts, or growths Head:  no masses, lesions, or tenderness Eyes:  pupils equal and round, sclera anicteric without injection Ears:  canals without lesions, TMs shiny without retraction, no obvious effusion, no erythema Nose:  nares patent, mucosa normal Throat/Pharynx:  lips and gingiva without lesion; tongue and uvula midline; non-inflamed pharynx; no exudates or postnasal drainage Neck: neck supple without adenopathy, thyromegaly, or masses Lungs:  clear to auscultation, breath sounds equal bilaterally, no respiratory distress Cardio:  regular rate and rhythm, no bruits, no LE edema Abdomen:  abdomen soft, nontender; bowel sounds normal; no masses or organomegaly Genital (male): Deferred Rectal: Deferred Musculoskeletal:  symmetrical muscle groups noted without atrophy or deformity Extremities:  no clubbing, cyanosis, or edema, no  deformities, no skin discoloration Neuro:  gait normal; deep tendon reflexes normal and symmetric Psych: well oriented with normal range of affect and appropriate judgment/insight  Assessment and Plan  Well adult exam - Plan: CBC, Comprehensive metabolic panel with GFR, Lipid panel, Hepatitis B surface antibody,quantitative   Well 39 y.o. male. Counseled on diet and exercise. Self testicular exams recommended at least monthly.  Flu shot politely declined. Screen hep B as above. Other  orders as above. Follow up in 1 year pending the above workup. The patient voiced understanding and agreement to the plan.  Mabel [place], DO 10/11/2024 7:17 AM     [1]  Current Outpatient Medications on File Prior to Visit  Medication Sig Dispense Refill   clonazePAM  (KLONOPIN ) 1 MG tablet Take 1 mg by mouth daily.     levocetirizine (XYZAL ) 5 MG tablet Take 1 tablet (5 mg total) by mouth every evening. 90 tablet 1   sildenafil  (VIAGRA ) 100 MG tablet Take 0.5-1 tablets (50-100 mg total) by mouth daily as needed for erectile dysfunction. 30 tablet 2   venlafaxine  XR (EFFEXOR -XR) 150 MG 24 hr capsule Take 150 mg by mouth daily with breakfast.     venlafaxine  XR (EFFEXOR -XR) 75 MG 24 hr capsule Take 75 mg by mouth daily with breakfast.     No current facility-administered medications on file prior to visit.  [2] No Known Allergies

## 2024-10-11 NOTE — Patient Instructions (Addendum)
Give us 2-3 business days to get the results of your labs back.   Keep the diet clean and stay active.  Do monthly self testicular checks in the shower. You are feeling for lumps/bumps that don't belong. If you feel anything like this, let me know!  Let us know if you need anything. 

## 2024-10-12 LAB — HIV ANTIBODY (ROUTINE TESTING W REFLEX)
HIV 1&2 Ab, 4th Generation: NONREACTIVE
HIV FINAL INTERPRETATION: NEGATIVE

## 2024-10-12 LAB — URINE CYTOLOGY ANCILLARY ONLY
Chlamydia: NEGATIVE
Comment: NEGATIVE
Comment: NEGATIVE
Comment: NORMAL
Neisseria Gonorrhea: NEGATIVE
Trichomonas: NEGATIVE

## 2024-10-12 LAB — HEPATITIS B SURFACE ANTIBODY, QUANTITATIVE: Hep B S AB Quant (Post): 466 m[IU]/mL

## 2025-10-12 ENCOUNTER — Encounter: Admitting: Family Medicine
